# Patient Record
Sex: Male | Born: 1945
Health system: Southern US, Community
[De-identification: ages and names within clinical notes are randomized; demographics above are authoritative.]

## PROBLEM LIST (undated history)

## (undated) DIAGNOSIS — I213 ST elevation (STEMI) myocardial infarction of unspecified site: Secondary | ICD-10-CM

## (undated) DIAGNOSIS — I10 Essential (primary) hypertension: Secondary | ICD-10-CM

## (undated) DIAGNOSIS — K2270 Barrett's esophagus without dysplasia: Secondary | ICD-10-CM

## (undated) DIAGNOSIS — I2510 Atherosclerotic heart disease of native coronary artery without angina pectoris: Secondary | ICD-10-CM

## (undated) DIAGNOSIS — E119 Type 2 diabetes mellitus without complications: Secondary | ICD-10-CM

## (undated) DIAGNOSIS — E7800 Pure hypercholesterolemia, unspecified: Secondary | ICD-10-CM

## (undated) DIAGNOSIS — T8859XA Other complications of anesthesia, initial encounter: Secondary | ICD-10-CM

## (undated) DIAGNOSIS — T4145XA Adverse effect of unspecified anesthetic, initial encounter: Secondary | ICD-10-CM

## (undated) HISTORY — DX: Barrett's esophagus without dysplasia: K22.70

## (undated) HISTORY — DX: Pure hypercholesterolemia, unspecified: E78.00

## (undated) HISTORY — PX: OTHER SURGICAL HISTORY: SHX169

---

## 2006-07-29 DIAGNOSIS — K2270 Barrett's esophagus without dysplasia: Secondary | ICD-10-CM

## 2006-07-29 HISTORY — PX: ESOPHAGOGASTRODUODENOSCOPY: SHX1529

## 2006-07-29 HISTORY — PX: COLONOSCOPY: SHX174

## 2006-07-29 HISTORY — DX: Barrett's esophagus without dysplasia: K22.70

## 2015-01-09 ENCOUNTER — Emergency Department (HOSPITAL_COMMUNITY): Payer: Non-veteran care

## 2015-01-09 ENCOUNTER — Encounter (HOSPITAL_COMMUNITY): Payer: Self-pay | Admitting: *Deleted

## 2015-01-09 ENCOUNTER — Encounter (HOSPITAL_COMMUNITY)
Admission: EM | Disposition: A | Discharge status: Home / Self Care | Payer: Self-pay | Source: Home / Self Care | Attending: Cardiology

## 2015-01-09 ENCOUNTER — Other Ambulatory Visit (HOSPITAL_COMMUNITY): Payer: Self-pay

## 2015-01-09 ENCOUNTER — Inpatient Hospital Stay (HOSPITAL_COMMUNITY)
Admission: EM | Admit: 2015-01-09 | Discharge: 2015-01-11 | DRG: 247 | Disposition: A | Discharge status: Home / Self Care | Payer: Non-veteran care | Attending: Cardiology | Admitting: Cardiology

## 2015-01-09 DIAGNOSIS — E119 Type 2 diabetes mellitus without complications: Secondary | ICD-10-CM | POA: Diagnosis present

## 2015-01-09 DIAGNOSIS — K219 Gastro-esophageal reflux disease without esophagitis: Secondary | ICD-10-CM | POA: Diagnosis present

## 2015-01-09 DIAGNOSIS — I25110 Atherosclerotic heart disease of native coronary artery with unstable angina pectoris: Secondary | ICD-10-CM | POA: Diagnosis present

## 2015-01-09 DIAGNOSIS — Z87891 Personal history of nicotine dependence: Secondary | ICD-10-CM | POA: Diagnosis not present

## 2015-01-09 DIAGNOSIS — Z8249 Family history of ischemic heart disease and other diseases of the circulatory system: Secondary | ICD-10-CM | POA: Diagnosis not present

## 2015-01-09 DIAGNOSIS — I2102 ST elevation (STEMI) myocardial infarction involving left anterior descending coronary artery: Secondary | ICD-10-CM | POA: Diagnosis not present

## 2015-01-09 DIAGNOSIS — E785 Hyperlipidemia, unspecified: Secondary | ICD-10-CM | POA: Diagnosis present

## 2015-01-09 DIAGNOSIS — E876 Hypokalemia: Secondary | ICD-10-CM | POA: Diagnosis not present

## 2015-01-09 DIAGNOSIS — I9788 Other intraoperative complications of the circulatory system, not elsewhere classified: Secondary | ICD-10-CM | POA: Diagnosis not present

## 2015-01-09 DIAGNOSIS — D62 Acute posthemorrhagic anemia: Secondary | ICD-10-CM | POA: Diagnosis not present

## 2015-01-09 DIAGNOSIS — I2119 ST elevation (STEMI) myocardial infarction involving other coronary artery of inferior wall: Secondary | ICD-10-CM | POA: Diagnosis present

## 2015-01-09 DIAGNOSIS — I2510 Atherosclerotic heart disease of native coronary artery without angina pectoris: Secondary | ICD-10-CM

## 2015-01-09 DIAGNOSIS — IMO0001 Reserved for inherently not codable concepts without codable children: Secondary | ICD-10-CM

## 2015-01-09 DIAGNOSIS — Z743 Need for continuous supervision: Secondary | ICD-10-CM | POA: Diagnosis not present

## 2015-01-09 DIAGNOSIS — Z794 Long term (current) use of insulin: Secondary | ICD-10-CM

## 2015-01-09 DIAGNOSIS — I10 Essential (primary) hypertension: Secondary | ICD-10-CM | POA: Diagnosis present

## 2015-01-09 DIAGNOSIS — I213 ST elevation (STEMI) myocardial infarction of unspecified site: Secondary | ICD-10-CM | POA: Diagnosis present

## 2015-01-09 DIAGNOSIS — R079 Chest pain, unspecified: Secondary | ICD-10-CM | POA: Diagnosis not present

## 2015-01-09 HISTORY — DX: Atherosclerotic heart disease of native coronary artery without angina pectoris: I25.10

## 2015-01-09 HISTORY — DX: Type 2 diabetes mellitus without complications: E11.9

## 2015-01-09 HISTORY — DX: ST elevation (STEMI) myocardial infarction of unspecified site: I21.3

## 2015-01-09 HISTORY — DX: Essential (primary) hypertension: I10

## 2015-01-09 HISTORY — PX: CARDIAC CATHETERIZATION: SHX172

## 2015-01-09 LAB — TROPONIN I
Troponin I: <0.03 ng/mL (ref <0.031)
Troponin I: <0.03 ng/mL (ref <0.031)
Troponin I: 0.24 ng/mL — ABNORMAL HIGH (ref <0.031)
Troponin I: 0.64 ng/mL (ref <0.031)

## 2015-01-09 LAB — GLUCOSE, CAPILLARY
Glucose-Capillary: 218 mg/dL — ABNORMAL HIGH (ref 65–99)
Glucose-Capillary: 278 mg/dL — ABNORMAL HIGH (ref 65–99)

## 2015-01-09 LAB — COMPREHENSIVE METABOLIC PANEL
ALBUMIN: 4.6 g/dL (ref 3.5–5.0)
ALT: 47 U/L (ref 17–63)
ANION GAP: 11 (ref 5–15)
AST: 32 U/L (ref 15–41)
Alkaline Phosphatase: 79 U/L (ref 38–126)
BUN: 18 mg/dL (ref 6–20)
CALCIUM: 9.5 mg/dL (ref 8.9–10.3)
CHLORIDE: 98 mmol/L — AB (ref 101–111)
CO2: 28 mmol/L (ref 22–32)
Creatinine, Ser: 0.87 mg/dL (ref 0.61–1.24)
GFR calc Af Amer: >60 mL/min (ref >60)
Glucose, Bld: 253 mg/dL — ABNORMAL HIGH (ref 65–99)
Potassium: 4 mmol/L (ref 3.5–5.1)
Sodium: 137 mmol/L (ref 135–145)
Total Bilirubin: 1.2 mg/dL (ref 0.3–1.2)
Total Protein: 7.8 g/dL (ref 6.5–8.1)

## 2015-01-09 LAB — CBC
HCT: 35.4 % — ABNORMAL LOW (ref 39.0–52.0)
Hemoglobin: 12.8 g/dL — ABNORMAL LOW (ref 13.0–17.0)
MCH: 29.4 pg (ref 26.0–34.0)
MCHC: 36.2 g/dL — ABNORMAL HIGH (ref 30.0–36.0)
MCV: 81.4 fL (ref 78.0–100.0)
PLATELETS: 215 10*3/uL (ref 150–400)
RBC: 4.35 MIL/uL (ref 4.22–5.81)
RDW: 12.5 % (ref 11.5–15.5)
WBC: 8.7 10*3/uL (ref 4.0–10.5)

## 2015-01-09 LAB — TSH: TSH: 0.715 u[IU]/mL (ref 0.350–4.500)

## 2015-01-09 LAB — CBC WITH DIFFERENTIAL/PLATELET
BASOS PCT: 0 % (ref 0–1)
Basophils Absolute: 0 10*3/uL (ref 0.0–0.1)
EOS ABS: 0.1 10*3/uL (ref 0.0–0.7)
Eosinophils Relative: 1 % (ref 0–5)
HCT: 42.8 % (ref 39.0–52.0)
HEMOGLOBIN: 15.1 g/dL (ref 13.0–17.0)
Lymphocytes Relative: 33 % (ref 12–46)
Lymphs Abs: 2 10*3/uL (ref 0.7–4.0)
MCH: 29.4 pg (ref 26.0–34.0)
MCHC: 35.3 g/dL (ref 30.0–36.0)
MCV: 83.4 fL (ref 78.0–100.0)
Monocytes Absolute: 0.4 10*3/uL (ref 0.1–1.0)
Monocytes Relative: 7 % (ref 3–12)
Neutro Abs: 3.6 10*3/uL (ref 1.7–7.7)
Neutrophils Relative %: 59 % (ref 43–77)
Platelets: 213 10*3/uL (ref 150–400)
RBC: 5.13 MIL/uL (ref 4.22–5.81)
RDW: 12.6 % (ref 11.5–15.5)
WBC: 6.1 10*3/uL (ref 4.0–10.5)

## 2015-01-09 LAB — MRSA PCR SCREENING: MRSA by PCR: NEGATIVE

## 2015-01-09 LAB — PROTIME-INR
INR: 1 (ref 0.00–1.49)
PROTHROMBIN TIME: 13.4 s (ref 11.6–15.2)

## 2015-01-09 LAB — POCT ACTIVATED CLOTTING TIME: ACTIVATED CLOTTING TIME: 491 s

## 2015-01-09 SURGERY — LEFT HEART CATH AND CORONARY ANGIOGRAPHY

## 2015-01-09 MED ORDER — HEPARIN (PORCINE) IN NACL 2-0.9 UNIT/ML-% IJ SOLN
INTRAMUSCULAR | Status: AC
  Filled 2015-01-09: qty 1000

## 2015-01-09 MED ORDER — ENOXAPARIN SODIUM 40 MG/0.4ML ~~LOC~~ SOLN
40.0000 mg | SUBCUTANEOUS | Status: DC
  Administered 2015-01-10 – 2015-01-11 (×2): 40 mg via SUBCUTANEOUS
  Filled 2015-01-09 (×3): qty 0.4

## 2015-01-09 MED ORDER — SODIUM CHLORIDE 0.9 % IJ SOLN: 3.0000 mL | INTRAMUSCULAR | Status: DC | PRN

## 2015-01-09 MED ORDER — SODIUM CHLORIDE 0.9 % IV SOLN: 250.0000 mL | INTRAVENOUS | Status: DC | PRN

## 2015-01-09 MED ORDER — MIDAZOLAM HCL 2 MG/2ML IJ SOLN
INTRAMUSCULAR | Status: AC
  Filled 2015-01-09: qty 2

## 2015-01-09 MED ORDER — ASPIRIN 325 MG PO TABS
325.0000 mg | ORAL_TABLET | Freq: Once | ORAL | Status: AC
  Administered 2015-01-09: 325 mg via ORAL
  Filled 2015-01-09: qty 1

## 2015-01-09 MED ORDER — NITROGLYCERIN 1 MG/10 ML FOR IR/CATH LAB
INTRA_ARTERIAL | Status: AC
  Filled 2015-01-09: qty 10

## 2015-01-09 MED ORDER — BIVALIRUDIN 250 MG IV SOLR
INTRAVENOUS | Status: AC
  Filled 2015-01-09: qty 250

## 2015-01-09 MED ORDER — TICAGRELOR 90 MG PO TABS
ORAL_TABLET | ORAL | Status: AC
  Filled 2015-01-09: qty 1

## 2015-01-09 MED ORDER — METOPROLOL TARTRATE 1 MG/ML IV SOLN
INTRAVENOUS | Status: DC | PRN
  Administered 2015-01-09: 5 mg via INTRAVENOUS

## 2015-01-09 MED ORDER — ONDANSETRON HCL 4 MG/2ML IJ SOLN
4.0000 mg | Freq: Four times a day (QID) | INTRAMUSCULAR | Status: DC | PRN
  Administered 2015-01-09: 4 mg via INTRAVENOUS
  Filled 2015-01-09: qty 2

## 2015-01-09 MED ORDER — IOHEXOL 350 MG/ML SOLN
INTRAVENOUS | Status: DC | PRN
  Administered 2015-01-09: 245 mL via INTRAVENOUS

## 2015-01-09 MED ORDER — AMLODIPINE BESYLATE 5 MG PO TABS
5.0000 mg | ORAL_TABLET | Freq: Every day | ORAL | Status: DC
  Administered 2015-01-09 – 2015-01-11 (×3): 5 mg via ORAL
  Filled 2015-01-09 (×3): qty 1

## 2015-01-09 MED ORDER — ASPIRIN 81 MG PO CHEW
81.0000 mg | CHEWABLE_TABLET | Freq: Every day | ORAL | Status: DC
  Administered 2015-01-10 – 2015-01-11 (×2): 81 mg via ORAL
  Filled 2015-01-09 (×2): qty 1

## 2015-01-09 MED ORDER — ALPRAZOLAM 0.25 MG PO TABS: 1.0000 mg | ORAL_TABLET | Freq: Two times a day (BID) | ORAL | Status: DC | PRN

## 2015-01-09 MED ORDER — HEPARIN (PORCINE) IN NACL 100-0.45 UNIT/ML-% IJ SOLN
1000.0000 [IU]/h | INTRAMUSCULAR | Status: DC
  Administered 2015-01-09: 1000 [IU]/h via INTRAVENOUS
  Filled 2015-01-09: qty 250

## 2015-01-09 MED ORDER — SODIUM CHLORIDE 0.9 % IV SOLN
250.0000 mg | INTRAVENOUS | Status: DC | PRN
  Administered 2015-01-09 (×2): 1.75 mg/kg/h via INTRAVENOUS

## 2015-01-09 MED ORDER — INSULIN ASPART 100 UNIT/ML ~~LOC~~ SOLN: 0.0000 [IU] | Freq: Three times a day (TID) | SUBCUTANEOUS | Status: DC

## 2015-01-09 MED ORDER — BIVALIRUDIN BOLUS VIA INFUSION - CUPID
INTRAVENOUS | Status: DC | PRN
  Administered 2015-01-09: 62.925 mg via INTRAVENOUS

## 2015-01-09 MED ORDER — PNEUMOCOCCAL VAC POLYVALENT 25 MCG/0.5ML IJ INJ
0.5000 mL | INJECTION | INTRAMUSCULAR | Status: DC
  Filled 2015-01-09: qty 0.5

## 2015-01-09 MED ORDER — TIROFIBAN HCL IV 5 MG/100ML
0.1500 ug/kg/min | INTRAVENOUS | Status: AC
  Administered 2015-01-09 – 2015-01-10 (×3): 0.15 ug/kg/min via INTRAVENOUS
  Filled 2015-01-09 (×2): qty 100

## 2015-01-09 MED ORDER — HYDROCHLOROTHIAZIDE 12.5 MG PO CAPS
12.5000 mg | ORAL_CAPSULE | Freq: Every day | ORAL | Status: DC
  Administered 2015-01-10 – 2015-01-11 (×2): 12.5 mg via ORAL
  Filled 2015-01-09 (×2): qty 1

## 2015-01-09 MED ORDER — ACETAMINOPHEN 325 MG PO TABS: 650.0000 mg | ORAL_TABLET | ORAL | Status: DC | PRN

## 2015-01-09 MED ORDER — ZOLPIDEM TARTRATE 5 MG PO TABS: 5.0000 mg | ORAL_TABLET | Freq: Every evening | ORAL | Status: DC | PRN

## 2015-01-09 MED ORDER — ALPRAZOLAM 0.25 MG PO TABS: 0.2500 mg | ORAL_TABLET | Freq: Two times a day (BID) | ORAL | Status: DC | PRN

## 2015-01-09 MED ORDER — NITROGLYCERIN 0.4 MG SL SUBL: 0.4000 mg | SUBLINGUAL_TABLET | SUBLINGUAL | Status: DC | PRN

## 2015-01-09 MED ORDER — NITROGLYCERIN IN D5W 200-5 MCG/ML-% IV SOLN
0.0000 ug/min | INTRAVENOUS | Status: DC
Start: 2015-01-09 — End: 2015-01-11
  Administered 2015-01-09: 30 ug/min via INTRAVENOUS
  Administered 2015-01-10: 70 ug/min via INTRAVENOUS
  Filled 2015-01-09: qty 250

## 2015-01-09 MED ORDER — LISINOPRIL 20 MG PO TABS
20.0000 mg | ORAL_TABLET | Freq: Every day | ORAL | Status: DC
  Administered 2015-01-10 – 2015-01-11 (×2): 20 mg via ORAL
  Filled 2015-01-09 (×2): qty 1

## 2015-01-09 MED ORDER — NITROGLYCERIN IN D5W 200-5 MCG/ML-% IV SOLN
INTRAVENOUS | Status: DC | PRN
  Administered 2015-01-09: 10 ug/min via INTRAVENOUS

## 2015-01-09 MED ORDER — SODIUM CHLORIDE 0.9 % IJ SOLN
3.0000 mL | Freq: Two times a day (BID) | INTRAMUSCULAR | Status: DC
Start: 2015-01-09 — End: 2015-01-11
  Administered 2015-01-09 – 2015-01-11 (×4): 3 mL via INTRAVENOUS

## 2015-01-09 MED ORDER — HEPARIN SODIUM (PORCINE) 5000 UNIT/ML IJ SOLN
4000.0000 [IU] | Freq: Once | INTRAMUSCULAR | Status: AC
  Administered 2015-01-09: 4000 [IU] via INTRAVENOUS
  Filled 2015-01-09: qty 1

## 2015-01-09 MED ORDER — LISINOPRIL-HYDROCHLOROTHIAZIDE 20-12.5 MG PO TABS: 1.0000 | ORAL_TABLET | Freq: Every day | ORAL | Status: DC

## 2015-01-09 MED ORDER — PANTOPRAZOLE SODIUM 40 MG PO TBEC
40.0000 mg | DELAYED_RELEASE_TABLET | Freq: Every day | ORAL | Status: DC
  Administered 2015-01-10 – 2015-01-11 (×2): 40 mg via ORAL
  Filled 2015-01-09 (×2): qty 1

## 2015-01-09 MED ORDER — MORPHINE SULFATE 4 MG/ML IJ SOLN
INTRAMUSCULAR | Status: AC
  Filled 2015-01-09: qty 1

## 2015-01-09 MED ORDER — MIDAZOLAM HCL 2 MG/2ML IJ SOLN
INTRAMUSCULAR | Status: DC | PRN
  Administered 2015-01-09 (×3): 2 mg via INTRAVENOUS

## 2015-01-09 MED ORDER — SODIUM CHLORIDE 0.9 % WEIGHT BASED INFUSION
3.0000 mL/kg/h | INTRAVENOUS | Status: AC
  Administered 2015-01-09: 3 mL/kg/h via INTRAVENOUS

## 2015-01-09 MED ORDER — MORPHINE SULFATE 4 MG/ML IJ SOLN
4.0000 mg | Freq: Once | INTRAMUSCULAR | Status: AC
  Administered 2015-01-09: 4 mg via INTRAVENOUS

## 2015-01-09 MED ORDER — NITROGLYCERIN 1 MG/10 ML FOR IR/CATH LAB
INTRA_ARTERIAL | Status: DC | PRN
  Administered 2015-01-09: 200 ug

## 2015-01-09 MED ORDER — FENTANYL CITRATE (PF) 100 MCG/2ML IJ SOLN
INTRAMUSCULAR | Status: DC | PRN
  Administered 2015-01-09 (×2): 50 ug via INTRAVENOUS
  Administered 2015-01-09: 25 ug via INTRAVENOUS
  Administered 2015-01-09: 50 ug via INTRAVENOUS

## 2015-01-09 MED ORDER — ACETAMINOPHEN 325 MG PO TABS
650.0000 mg | ORAL_TABLET | ORAL | Status: DC | PRN
  Administered 2015-01-10 (×3): 650 mg via ORAL
  Filled 2015-01-09 (×3): qty 2

## 2015-01-09 MED ORDER — METOPROLOL TARTRATE 1 MG/ML IV SOLN
INTRAVENOUS | Status: AC
  Filled 2015-01-09: qty 5

## 2015-01-09 MED ORDER — ATORVASTATIN CALCIUM 80 MG PO TABS: 80.0000 mg | ORAL_TABLET | Freq: Every day | ORAL | Status: DC

## 2015-01-09 MED ORDER — MORPHINE SULFATE 4 MG/ML IJ SOLN
4.0000 mg | Freq: Once | INTRAMUSCULAR | Status: AC
  Administered 2015-01-09: 4 mg via INTRAVENOUS
  Filled 2015-01-09: qty 1

## 2015-01-09 MED ORDER — ATORVASTATIN CALCIUM 80 MG PO TABS
80.0000 mg | ORAL_TABLET | Freq: Every day | ORAL | Status: DC
  Administered 2015-01-09 – 2015-01-10 (×2): 80 mg via ORAL
  Filled 2015-01-09 (×3): qty 1

## 2015-01-09 MED ORDER — MIDAZOLAM HCL 2 MG/2ML IJ SOLN
INTRAMUSCULAR | Status: AC
Start: 2015-01-09 — End: 2015-01-09
  Filled 2015-01-09: qty 2

## 2015-01-09 MED ORDER — LIDOCAINE HCL (PF) 1 % IJ SOLN
INTRAMUSCULAR | Status: AC
  Filled 2015-01-09: qty 30

## 2015-01-09 MED ORDER — ALUM & MAG HYDROXIDE-SIMETH 200-200-20 MG/5ML PO SUSP
30.0000 mL | Freq: Four times a day (QID) | ORAL | Status: DC | PRN
  Administered 2015-01-09: 30 mL via ORAL
  Filled 2015-01-09: qty 30

## 2015-01-09 MED ORDER — NITROGLYCERIN IN D5W 200-5 MCG/ML-% IV SOLN
INTRAVENOUS | Status: AC
  Filled 2015-01-09: qty 250

## 2015-01-09 MED ORDER — ATENOLOL 50 MG PO TABS
50.0000 mg | ORAL_TABLET | Freq: Every day | ORAL | Status: DC
  Filled 2015-01-09: qty 1

## 2015-01-09 MED ORDER — TIROFIBAN HCL IV 5 MG/100ML
INTRAVENOUS | Status: DC | PRN
  Administered 2015-01-09: 0.15 ug/kg/min via INTRAVENOUS
  Administered 2015-01-09: 25200 ug/h via INTRAVENOUS

## 2015-01-09 MED ORDER — SODIUM CHLORIDE 0.9 % IV SOLN
1.7500 mg/kg/h | INTRAVENOUS | Status: DC
  Filled 2015-01-09: qty 250

## 2015-01-09 MED ORDER — ONDANSETRON HCL 4 MG/2ML IJ SOLN: 4.0000 mg | Freq: Four times a day (QID) | INTRAMUSCULAR | Status: DC | PRN

## 2015-01-09 MED ORDER — FENTANYL CITRATE (PF) 100 MCG/2ML IJ SOLN
INTRAMUSCULAR | Status: AC
  Filled 2015-01-09: qty 2

## 2015-01-09 MED ORDER — MORPHINE SULFATE 2 MG/ML IJ SOLN
2.0000 mg | INTRAMUSCULAR | Status: DC | PRN
  Administered 2015-01-09 – 2015-01-10 (×4): 2 mg via INTRAVENOUS
  Filled 2015-01-09 (×4): qty 1

## 2015-01-09 MED ORDER — INSULIN ASPART 100 UNIT/ML ~~LOC~~ SOLN
0.0000 [IU] | Freq: Every day | SUBCUTANEOUS | Status: DC
  Administered 2015-01-09 – 2015-01-10 (×2): 3 [IU] via SUBCUTANEOUS

## 2015-01-09 MED ORDER — GLIPIZIDE 10 MG PO TABS
20.0000 mg | ORAL_TABLET | Freq: Two times a day (BID) | ORAL | Status: DC
  Administered 2015-01-10 – 2015-01-11 (×3): 20 mg via ORAL
  Filled 2015-01-09 (×5): qty 2

## 2015-01-09 MED ORDER — SODIUM CHLORIDE 0.9 % IJ SOLN: 3.0000 mL | Freq: Two times a day (BID) | INTRAMUSCULAR | Status: DC

## 2015-01-09 MED ORDER — INSULIN ASPART 100 UNIT/ML ~~LOC~~ SOLN
0.0000 [IU] | Freq: Three times a day (TID) | SUBCUTANEOUS | Status: DC
  Administered 2015-01-10 (×2): 5 [IU] via SUBCUTANEOUS
  Administered 2015-01-10: 3 [IU] via SUBCUTANEOUS
  Administered 2015-01-11 (×2): 5 [IU] via SUBCUTANEOUS

## 2015-01-09 MED ORDER — TICAGRELOR 90 MG PO TABS
90.0000 mg | ORAL_TABLET | Freq: Two times a day (BID) | ORAL | Status: DC
  Administered 2015-01-09 – 2015-01-11 (×4): 90 mg via ORAL
  Filled 2015-01-09 (×5): qty 1

## 2015-01-09 MED ORDER — VITAMIN D3 25 MCG (1000 UNIT) PO TABS
1000.0000 [IU] | ORAL_TABLET | Freq: Every day | ORAL | Status: DC
Start: 2015-01-10 — End: 2015-01-11
  Administered 2015-01-10 – 2015-01-11 (×2): 1000 [IU] via ORAL
  Filled 2015-01-09 (×2): qty 1

## 2015-01-09 MED ORDER — TIROFIBAN HCL IV 5 MG/100ML
INTRAVENOUS | Status: AC
  Filled 2015-01-09: qty 100

## 2015-01-09 SURGICAL SUPPLY — 31 items
BALLN ANGIOSCULPT RX 2.0X10 (BALLOONS) ×4
BALLN EMERGE MR 2.0X8 (BALLOONS) ×4
BALLN MINITREK RX 1.5X8 (BALLOONS) ×2 IMPLANT
BALLN TREK RX 2.5X12 (BALLOONS) ×4
BALLN ~~LOC~~ EUPHORA RX 2.75X12 (BALLOONS) ×4
BALLN ~~LOC~~ TREK RX 2.25X8 (BALLOONS) ×4
BALLN ~~LOC~~ TREK RX 3.0X8 (BALLOONS) ×4
BALLN ~~LOC~~ TREK RX 3.25X8 (BALLOONS) ×2 IMPLANT
BALLOON ANGIOSCULPT RX 2.0X10 (BALLOONS) IMPLANT
BALLOON EMERGE MR 2.0X8 (BALLOONS) IMPLANT
BALLOON TREK RX 2.5X12 (BALLOONS) IMPLANT
BALLOON ~~LOC~~ EUPHORA RX 2.75X12 (BALLOONS) IMPLANT
BALLOON ~~LOC~~ TREK RX 2.25X8 (BALLOONS) IMPLANT
BALLOON ~~LOC~~ TREK RX 3.0X8 (BALLOONS) IMPLANT
CATH INFINITI 5FR ANG PIGTAIL (CATHETERS) ×4 IMPLANT
CATH OPTITORQUE TIG 4.0 5F (CATHETERS) ×4 IMPLANT
CATH VISTA GUIDE 6FR XBLAD3.5 (CATHETERS) ×2 IMPLANT
DEVICE RAD COMP TR BAND LRG (VASCULAR PRODUCTS) ×4 IMPLANT
GLIDESHEATH SLEND A-KIT 6F 22G (SHEATH) ×4 IMPLANT
KIT ENCORE 26 ADVANTAGE (KITS) ×4 IMPLANT
KIT HEART LEFT (KITS) ×4 IMPLANT
PACK CARDIAC CATHETERIZATION (CUSTOM PROCEDURE TRAY) ×4 IMPLANT
STENT XIENCE ALPINE RX 2.25X12 (Permanent Stent) ×2 IMPLANT
STENT XIENCE ALPINE RX 2.5X18 (Permanent Stent) ×2 IMPLANT
STENT XIENCE ALPINE RX 2.5X8 (Permanent Stent) ×2 IMPLANT
SYR MEDRAD MARK V 150ML (SYRINGE) ×4 IMPLANT
TRANSDUCER W/STOPCOCK (MISCELLANEOUS) ×4 IMPLANT
TUBING CIL FLEX 10 FLL-RA (TUBING) ×4 IMPLANT
WIRE ASAHI PROWATER 180CM (WIRE) ×2 IMPLANT
WIRE HI TORQ BMW 190CM (WIRE) ×2 IMPLANT
WIRE SAFE-T 1.5MM-J .035X260CM (WIRE) ×6 IMPLANT

## 2015-01-09 NOTE — ED Provider Notes (Signed)
CSN: [id number]     Arrival date & time 01/09/15  1148 History   This chart was scribed for Azalia Bilis, MD by Tanda Rockers, ED Scribe. This patient was seen in room APA18/[facility]18 and the patient's care was started at 12:06 PM.  Chief Complaint  Patient presents with  . Chest Pain   The history is provided by the patient and a relative. No language interpreter was used.     HPI Comments: Gary Wheeler is a 68 y.o. male with hx HTN and DM who presents to the Emergency Department complaining of sudden onset, constant, left chest pain that occurred this morning around 10 AM ( 2 hours ago) Pt was working in his garden and doing heavy lifting when the pain came on. He describes it as a heavy cramping sensation. Pt also complains of shortness of breath. He mentions that he felt gassy today and has been burping a lot. Pt states that last week he had a dry cough, nasal congestion, fever, and generalized body aches that has since resolved. Family reports that a couple of weeks ago he was having mild chest discomfort and feeling short of breath. Pt states it felt like he pulled a muscle but that the pain feels more severe today. Pt has never had a stress test or cardiac cath in the past. He does have positive fhx cardiac issues with father and grandfather.   Past Medical History  Diagnosis Date  . Hypertension   . Diabetes mellitus without complication    History reviewed. No pertinent past surgical history. History reviewed. No pertinent family history. History  Substance Use Topics  . Smoking status: Former Games developer  . Smokeless tobacco: Not on file  . Alcohol Use: Yes     Comment: rare    Review of Systems  A complete 10 system review of systems was obtained and all systems are negative except as noted in the HPI and PMH.    Allergies  Review of patient's allergies indicates no known allergies.  Home Medications  Unknown   Triage Vitals: BP 176/118 mmHg  Pulse 62  Temp(Src) 97.7 F  (36.5 C) (Oral)  Resp 15  Ht  (1.803 m)  Wt 185 lb (83.915 kg)  BMI 25.81 kg/m2  SpO2 96%   Physical Exam  Constitutional: He is oriented to person, place, and time. He appears well-developed and well-nourished.  HENT:  Head: Normocephalic and atraumatic.  Eyes: EOM are normal.  Neck: Normal range of motion.  Cardiovascular: Normal rate, regular rhythm, normal heart sounds and intact distal pulses.   Pulmonary/Chest: Effort normal and breath sounds normal. No respiratory distress.  Abdominal: Soft. He exhibits no distension. There is no tenderness.  Musculoskeletal: Normal range of motion.  Neurological: He is alert and oriented to person, place, and time.  Skin: Skin is warm and dry.  Psychiatric: He has a normal mood and affect. Judgment normal.  Nursing note and vitals reviewed.   ED Course  Procedures (including critical care time)  CRITICAL CARE Performed by: Lyanne Co Total critical care time: 35 Critical care time was exclusive of separately billable procedures and treating other patients. Critical care was necessary to treat or prevent imminent or life-threatening deterioration. Critical care was time spent personally by me on the following activities: development of treatment plan with patient and/or surrogate as well as nursing, discussions with consultants, evaluation of patient's response to treatment, examination of patient, obtaining history from patient or surrogate, ordering and performing treatments and interventions,  ordering and review of laboratory studies, ordering and review of radiographic studies, pulse oximetry and re-evaluation of patient's condition.   DIAGNOSTIC STUDIES: Oxygen Saturation is 96% on RA, normal by my interpretation.    COORDINATION OF CARE: 12:13 PM-Discussed treatment plan which includes CXR, CBC, CMP, Troponin with pt at bedside and pt agreed to plan.   Labs Review Labs Reviewed  CBC WITH DIFFERENTIAL/PLATELET   COMPREHENSIVE METABOLIC PANEL  TROPONIN I    Imaging Review No results found.   EKG Interpretation   Date/Time:  Monday January 09 2015 11:56:18 EDT Ventricular Rate:  60 PR Interval:  155 QRS Duration: 78 QT Interval:  417 QTC Calculation: 417 R Axis:   48 Text Interpretation:  Sinus rhythm Low voltage, precordial leads  nonspecific diffuse ST changes without reciprocal change No old tracing to  compare Confirmed by Natsuko Kelsay  MD, Caryn Bee ([id number]) on 01/09/2015 12:13:56 PM      MDM   Final diagnoses:  ST elevation myocardial infarction (STEMI), unspecified artery    1220pm- repeat ecg with concerning changes in II and III as compared to earlier ecg timed 1156am. With story concerning for ACS and now with dynamic ecg changes, code STEMI called. Initial ecg was nondiagnostic for STEMI. Spoke with Dr [place], interventional cardiology at [facility] who accepts the pt in transfer. 2nd IV now. ASA now. Heparin now. Morphine for pain. Emergent transfer  I personally performed the services described in this documentation, which was scribed in my presence. The recorded information has been reviewed and is accurate.        Azalia Bilis, MD 01/09/15 1235

## 2015-01-09 NOTE — Progress Notes (Signed)
eLink Physician-Brief Progress Note Patient Name: Gary Wheeler DOB: March 16, 1946 MRN: [id number]   Date of Service  01/09/2015  HPI/Events of Note  69 yo male with PMH of HTN and DM. Presents with chest pain and Acute Inferior STEMI. Now s/p cath lab and PCI/Stent. Current medical regimen includes Tenormin, Lipitor, Lisinopril, Hydrochlorothiazide, NTG IV infusion and Brilinta. Management per Cardiology. BP = 128/90 and HR = 52. O2 sat = 95% and RR = 16.  eICU Interventions  Continue present management.      Intervention Category Evaluation Type: New Patient Evaluation  Lenell Antu 01/09/2015, 5:00 PM

## 2015-01-09 NOTE — Progress Notes (Signed)
ANTICOAGULATION CONSULT NOTE - Initial Consult  Pharmacy Consult for heparin Indication: chest pain/ACS  No Known Allergies  Patient Measurements: Height: 5\' 11"  (180.3 cm) Weight: 185 lb (83.915 kg) IBW/kg (Calculated) : 75.3 HEPARIN DW (KG): 83.9  Vital Signs: Temp: 97.7 F (36.5 C) (06/13 1156) Temp Source: Oral (06/13 1156) BP: 176/118 mmHg (06/13 1200) Pulse Rate: 62 (06/13 1200)  Labs: No results for input(s): HGB, HCT, PLT, APTT, LABPROT, INR, HEPARINUNFRC, CREATININE, CKTOTAL, CKMB, TROPONINI in the last 72 hours.  CrCl cannot be calculated (Patient has no serum creatinine result on file.).   Medical History: Past Medical History  Diagnosis Date  . Hypertension   . Diabetes mellitus without complication     Medications:  Scheduled:  . heparin  4,000 Units Intravenous Once    Assessment: 69 yo M admitted with chest pain. ST elevated on EKG. Baseline labs pending.  Goal of Therapy:  Heparin level 0.3-0.7  Monitor platelets by anticoagulation protocol: Yes   Plan:  Give 4000 units bolus x 1 Start heparin infusion at 1000 units/hr Check anti-Xa level in 6 hours and daily while on heparin Continue to monitor H&H and platelets  Frederica Chrestman, [facility] 01/09/2015,12:25 PM

## 2015-01-09 NOTE — H&P (Addendum)
History and Physical Note:  NAME:  Gary Wheeler   MRN: [id number] DOB:  Nov 30, 1945   ADMIT DATE: 01/09/2015 [facility]   01/09/2015 1:58 PM  Gary Wheeler is a 69 y.o. male with hx HTN and DM who presented to the [facility] complaining of sudden onset, constant, left chest pain that occurred this morning around 10 AM.   Pt was working in his garden and doing heavy lifting when the pain came on. He describes it as a heavy cramping sensation. Pt also complains of shortness of breath. He mentions that he felt gassy today and has been burping a lot.   Pt states that last week he had a dry cough, nasal congestion, fever, and generalized body aches, but that has since resolved.   Family reports that he has been having milder chest discomfort and shortness of breath with exertion, relieved by rest. Pt states it felt like he pulled a muscle but that the pain feels more severe today. Pt has never had a stress test or cardiac cath in the past. He does have positive fhx cardiac issues with father and grandfather. Pain was 6/10 at worse. He had no N&V or diaphoresis.  Initial EKGs were nondiagnostic, but a follow-up EKG at 12:18 pm revealed inferior ST elevations. Code STEMI was called the patient was transferred for emergent cardiac catheterization.  He has received IV heparin as well as aspirin and morphine  @ [facility] ER.  On arrival to [facility] (1320) --> Cath Lab (13:20), the patient with 2/10 SSCP.  BP/HR are stable.   Past Medical History  Diagnosis Date  . Hypertension   . Diabetes mellitus without complication    Past Surgical History  Procedure Laterality Date  . Esophagogastroduodenoscopy    . Colonoscopy      FAMHx:  Family History  Problem Relation Age of Onset  . Coronary artery disease Father   . Coronary artery disease Paternal Grandfather    Family Status  Relation Status Death Age  . Mother Deceased 44    Hx trach  . Father Deceased 30   SOCHx:  History    Social History  . Marital Status: Married    Spouse Name: N/A  . Number of Children: N/A  . Years of Education: N/A   Occupational History  . Retired, but works 2 days/week gas station    Social History Main Topics  . Smoking status: Former Smoker    Quit date: 07/30/1999  . Smokeless tobacco: Never Used  . Alcohol Use: 0.0 oz/week    0 Standard drinks or equivalent per week     Comment: rare  . Drug Use: No  . Sexual Activity: Not on file   Other Topics Concern  . Not on file   Social History Narrative   Lives in [place], [place] with wife.    ALLERGIES: No Known Allergies  HOME MEDICATIONS: Unknown; He reports taking Prilosec & ?? BP medications. Family to bring in.  ROS: Takes Prilosec for GERD sx but no hx melena or bloody stools. No recent fevers or chills.  PHYSICAL EXAM:Blood pressure 168/99, pulse 61, temperature 97.7 F (36.5 C), temperature source Oral, resp. rate 21, height 5\' 11"  (1.803 m), weight 185 lb (83.915 kg), SpO2 99 %. General appearance: alert, cooperative, appears stated age, moderately obese and Otherwise well-nourished and well-developed HEENT: [place]/AT, EOMI, MMM, anicteric sclera Neck: no adenopathy, no carotid bruit, no JVD, supple, symmetrical, trachea midline and thyroid not enlarged, symmetric, no  tenderness/mass/nodules Lungs: clear to auscultation bilaterally, normal percussion bilaterally and Nonlabored, good air movement Heart: regular rate and rhythm, S1, S2 normal, no murmur, click, rub or gallop and normal apical impulse Abdomen: soft, non-tender; bowel sounds normal; no masses,  no organomegaly Extremities: extremities normal, atraumatic, no cyanosis or edema Pulses: 2+ and symmetric all 4 extrem Skin: Skin color, texture, turgor normal. No rashes or lesions Neurologic: Mental status: Alert, oriented, thought content appropriate Cranial nerves: normal   Adult ECG Report  Rate: 55 ;  Rhythm: sinus bradycardia;  QRS Axis: 55 ;  PR  Interval: 151 ;  QRS Duration: 98 ; QTc: 396;  Voltages:  borderline low voltage in precordial leads  Conduction Disturbances: none  Other Abnormalities: ~1-2 mm STE in II, III, aVF (also V4-6)   Narrative Interpretation: Inferior STE - consider Injury patterm  IMPRESSION & PLAN The patients' history has been reviewed, patient examined, no change in status from most recent note, stable for surgery. I have reviewed the patients' chart and labs. Questions were answered to the patient's satisfaction.    Gary Wheeler has presented today for surgery, with the diagnosis of chest pain The various methods of treatment have been discussed with the patient and family.   Risks / Complications include, but not limited to: Death, MI, CVA/TIA, VF/VT (with defibrillation), Bradycardia (need for temporary pacer placement), contrast induced nephropathy, bleeding / bruising / hematoma / pseudoaneurysm, vascular or coronary injury (with possible emergent CT or Vascular Surgery), adverse medication reactions, infection.     After consideration of risks, benefits and other options for treatment, the patient has consented to Procedure(s):  LEFT HEART CATHETERIZATION AND CORONARY ANGIOGRAPHY +/- AD HOC PERCUTANEOUS CORONARY INTERVENTION as a surgical intervention. Emergency Verbal Consent inferred.  We will proceed with the planned procedure.  He will be screened for CRFs and their control.  Signed Theodore Demark, PA-C   ATTENDING ATTESTATION:  Patient seen with Theodore Demark, PA-C.  I agree with her findings examination and recommendations. We discussed patient together. Plan is for emergent cardiac catheterization.   [place], [facility] [address] [place], [place]  [address]  [phone]  01/09/2015 1:58 PM

## 2015-01-09 NOTE — ED Notes (Signed)
Chest pain, onset this am.

## 2015-01-10 ENCOUNTER — Encounter (HOSPITAL_COMMUNITY): Payer: Self-pay | Admitting: Cardiology

## 2015-01-10 ENCOUNTER — Inpatient Hospital Stay (HOSPITAL_COMMUNITY): Payer: Non-veteran care

## 2015-01-10 DIAGNOSIS — I2510 Atherosclerotic heart disease of native coronary artery without angina pectoris: Secondary | ICD-10-CM

## 2015-01-10 LAB — LIPID PANEL
Cholesterol: 128 mg/dL (ref 0–200)
HDL: 28 mg/dL — ABNORMAL LOW (ref >40)
LDL Cholesterol: 60 mg/dL (ref 0–99)
Total CHOL/HDL Ratio: 4.6 RATIO
Triglycerides: 202 mg/dL — ABNORMAL HIGH (ref <150)
VLDL: 40 mg/dL (ref 0–40)

## 2015-01-10 LAB — CBC
HEMATOCRIT: 34.8 % — AB (ref 39.0–52.0)
HEMOGLOBIN: 12.2 g/dL — AB (ref 13.0–17.0)
MCH: 28.8 pg (ref 26.0–34.0)
MCHC: 35.1 g/dL (ref 30.0–36.0)
MCV: 82.1 fL (ref 78.0–100.0)
Platelets: 236 10*3/uL (ref 150–400)
RBC: 4.24 MIL/uL (ref 4.22–5.81)
RDW: 12.7 % (ref 11.5–15.5)
WBC: 8.1 10*3/uL (ref 4.0–10.5)

## 2015-01-10 LAB — BASIC METABOLIC PANEL
Anion gap: 10 (ref 5–15)
BUN: 16 mg/dL (ref 6–20)
CHLORIDE: 98 mmol/L — AB (ref 101–111)
CO2: 27 mmol/L (ref 22–32)
Calcium: 8.3 mg/dL — ABNORMAL LOW (ref 8.9–10.3)
Creatinine, Ser: 0.71 mg/dL (ref 0.61–1.24)
GFR calc non Af Amer: >60 mL/min (ref >60)
Glucose, Bld: 238 mg/dL — ABNORMAL HIGH (ref 65–99)
POTASSIUM: 3.2 mmol/L — AB (ref 3.5–5.1)
SODIUM: 135 mmol/L (ref 135–145)

## 2015-01-10 LAB — GLUCOSE, CAPILLARY
GLUCOSE-CAPILLARY: 229 mg/dL — AB (ref 65–99)
Glucose-Capillary: 219 mg/dL — ABNORMAL HIGH (ref 65–99)
Glucose-Capillary: 197 mg/dL — ABNORMAL HIGH (ref 65–99)
Glucose-Capillary: 253 mg/dL — ABNORMAL HIGH (ref 65–99)

## 2015-01-10 LAB — TROPONIN I
TROPONIN I: 6.25 ng/mL — AB (ref <0.031)
TROPONIN I: 4.48 ng/mL — AB (ref <0.031)
Troponin I: 1.77 ng/mL (ref <0.031)
Troponin I: 3.82 ng/mL (ref <0.031)

## 2015-01-10 LAB — HEMOGLOBIN A1C
Hgb A1c MFr Bld: 9.6 % — ABNORMAL HIGH (ref 4.8–5.6)
Mean Plasma Glucose: 229 mg/dL

## 2015-01-10 MED ORDER — POTASSIUM CHLORIDE CRYS ER 20 MEQ PO TBCR
40.0000 meq | EXTENDED_RELEASE_TABLET | Freq: Once | ORAL | Status: AC
  Administered 2015-01-10: 40 meq via ORAL
  Filled 2015-01-10: qty 2

## 2015-01-10 MED ORDER — ISOSORBIDE MONONITRATE ER 30 MG PO TB24
30.0000 mg | ORAL_TABLET | Freq: Every day | ORAL | Status: DC
  Administered 2015-01-10 – 2015-01-11 (×2): 30 mg via ORAL
  Filled 2015-01-10 (×2): qty 1

## 2015-01-10 MED ORDER — PERFLUTREN LIPID MICROSPHERE
INTRAVENOUS | Status: AC
  Administered 2015-01-10: 13:00:00
  Filled 2015-01-10: qty 10

## 2015-01-10 MED ORDER — METOPROLOL TARTRATE 50 MG PO TABS
50.0000 mg | ORAL_TABLET | Freq: Two times a day (BID) | ORAL | Status: DC
  Administered 2015-01-10 – 2015-01-11 (×3): 50 mg via ORAL
  Filled 2015-01-10 (×4): qty 1

## 2015-01-10 MED FILL — Lidocaine HCl Local Preservative Free (PF) Inj 1%: INTRAMUSCULAR | Qty: 30 | Status: AC

## 2015-01-10 MED FILL — Heparin Sodium (Porcine) 2 Unit/ML in Sodium Chloride 0.9%: INTRAMUSCULAR | Qty: 1000 | Status: AC

## 2015-01-10 NOTE — Progress Notes (Signed)
CARDIAC REHAB PHASE I   PRE:  Rate/Rhythm: 60 SR  BP:  Supine:   Sitting: 103/73  Standing:    SaO2: 97%RA  MODE:  Ambulation: chair   ft   POST:  Rate/Rhythm: 64  BP:  Supine:   Sitting: 100/64  Standing:    SaO2: 97%RA 1320-1425 Talked with pt's RN. Will hold ambulation right now but I put recliner in room and got pt to chair. Pt stated felt good to be up. Began MI education with pt and family. Discussed importance of brilinta with stent and gave booklet. Reviewed NTG use and carb counting. Will continue ed tomorrow.    Luetta Nutting, RN BSN  01/10/2015 2:26 PM

## 2015-01-10 NOTE — Care Management Note (Signed)
Case Management Note  Patient Details  Name: Venard Lescarbeau MRN: [id number] Date of Birth: Aug 14, 1945  Subjective/Objective:         Patients SS number was not correct with the hospital system.  Had patient verify SS Number, corrected in system.  [facility] diagnosis and expected discharge.  [facility] and [facility].  Patient is independent prior to admission - Given brilinta card to use 30 day free and get with Physician at [facility] to get prescription for this medication to continue post 30 days. Stressed importance of not letting this lapse.      Action/Plan:   Expected Discharge Date:                  Expected Discharge Plan:  Home/Self Care  In-House Referral:     Discharge planning Services     Post Acute Care Choice:    Choice offered to:     DME Arranged:    DME Agency:     HH Arranged:    HH Agency:     Status of Service:  In process, will continue to follow  Medicare Important Message Given:    Date Medicare IM Given:    Medicare IM give by:    Date Additional Medicare IM Given:    Additional Medicare Important Message give by:     If discussed at Long Length of Stay Meetings, dates discussed:    Additional Comments:  Vangie Bicker, RN 01/10/2015, 3:00 PM

## 2015-01-10 NOTE — Progress Notes (Signed)
  Echocardiogram 2D Echocardiogram has been performed.  Leta Jungling M 01/10/2015, 12:47 PM

## 2015-01-10 NOTE — Progress Notes (Signed)
Subjective:  Day 1 s/p Anterior STEMI;  Had some mild recurrent chest pain during the night and this am.  Objective:   Vital Signs : Filed Vitals:   01/10/15 0400 01/10/15 0500 01/10/15 0600 01/10/15 0700  BP: 123/80 126/87 114/79 105/70  Pulse: 64 69 66 68  Temp:  98.1 F (36.7 C)    TempSrc:  Oral    Resp: Height:      Weight:  179 lb 14.3 oz (81.6 kg)    SpO2: 93% 93% 91% 94%    Intake/Output from previous day:  Intake/Output Summary (Last 24 hours) at 01/10/15 0820 Last data filed at 01/10/15 0600  Gross per 24 hour  Intake 1053.39 ml  Output    950 ml  Net 103.39 ml    I/O since admission: +103  Wt Readings from Last 3 Encounters:  01/10/15 179 lb 14.3 oz (81.6 kg)    Medications: . amLODipine  5 mg Oral Daily  . aspirin  81 mg Oral Daily  . atenolol  50 mg Oral Daily  . atorvastatin  80 mg Oral q1800  . cholecalciferol  1,000 Units Oral Daily  . enoxaparin (LOVENOX) injection  40 mg Subcutaneous Q24H  . glipiZIDE  20 mg Oral BID AC  . lisinopril  20 mg Oral Daily   And  . hydrochlorothiazide  12.5 mg Oral Daily  . insulin aspart  0-15 Units Subcutaneous TID WC  . insulin aspart  0-5 Units Subcutaneous QHS  . pantoprazole  40 mg Oral Daily  . pneumococcal 23 valent vaccine  0.5 mL Intramuscular Tomorrow-1000  . sodium chloride  3 mL Intravenous Q12H  . ticagrelor  90 mg Oral BID    . nitroGLYCERIN 70 mcg/min (01/10/15 0750)    Physical Exam:   General appearance: alert, cooperative and no distress Neck: no adenopathy, no carotid bruit, no JVD, supple, symmetrical, trachea midline and thyroid not enlarged, symmetric, no tenderness/mass/nodules Lungs: clear to auscultation bilaterally Heart: regular rate and rhythm Abdomen: soft, non-tender; bowel sounds normal; no masses,  no organomegaly Extremities: no edema, redness or tenderness in the calves or thighs Pulses: 2+ and symmetric Skin: Skin color, texture, turgor normal. No  rashes or lesions Neurologic: Grossly normal   Rate: 77  Rhythm: normal sinus rhythm  ECG (independently read by me): NSR at 72; no sig STT changes; QTc normal  Lab Results:   Recent Labs  01/09/15 1200 01/10/15 0309  NA 137 135  K 4.0 3.2*  CL 98* 98*  CO2 28 27  GLUCOSE 253* 238*  BUN 18 16  CREATININE 0.87 0.71  CALCIUM 9.5 8.3*     Recent Labs  01/09/15 1200 01/09/15 2149 01/10/15 0309  WBC 6.1 8.7 8.1  NEUTROABS 3.6  --   --   HGB 15.1 12.8* 12.2*  HCT 42.8 35.4* 34.8*  MCV 83.4 81.4 82.1  PLT 213 215 236     Recent Labs  01/09/15 1924 01/09/15 2149 01/10/15 0309  TROPONINI 0.24* 0.64* 1.77*    Lab Results  Component Value Date   TSH 0.715 01/09/2015     Recent Labs  01/09/15 1200  PROT 7.8  ALBUMIN 4.6  AST 32  ALT 47  ALKPHOS 79  BILITOT 1.2    Recent Labs  01/09/15 1200  INR 1.00   BNP (last 3 results) No results for input(s): BNP in the last 8760 hours.  ProBNP (last 3 results) No results for input(s): PROBNP in  the last 8760 hours.   Lipid Panel     Component Value Date/Time   CHOL 128 01/10/2015 0309   TRIG 202* 01/10/2015 0309   HDL 28* 01/10/2015 0309   CHOLHDL 4.6 01/10/2015 0309   VLDL 40 01/10/2015 0309   LDLCALC 60 01/10/2015 0309        Recent Labs  01/09/15 1924  HGBA1C 9.6*     Imaging:  No results found.    Assessment/Plan:   Principal Problem:   Acute ST elevation myocardial infarction (STEMI) involving left anterior descending coronary artery Active Problems:   Hypertension   Non-insulin dependent type 2 diabetes mellitus   ST elevation myocardial infarction (STEMI) involving other coronary artery of inferior wall   STEMI (ST elevation myocardial infarction)   Essential hypertension   Coronary artery disease involving native coronary artery of native heart with unstable angina pectoris  1. Day 1 s/p STEMI; PCI with 2 DES stents in LAD/1 in Dx with comcomitant CAD in distal LAD,  RI. Will start oral nitrates and wean IV NTG. Change atenolol to metoprolol 50 mg bid.  2. Hypokalemia; K 3.2 will give KCL to get K 4 3. HTN 4. DM x ~15 yrs; not well controlled HbA1c 9.6 5. Atherogenic Dyslipidemia with low HDL, increased TG in DM; suspect preponderance of small LDL 6. Acute blood loss anemia    Lennette Bihari, MD, [facility] 01/10/2015, 8:20 AM

## 2015-01-11 ENCOUNTER — Encounter (HOSPITAL_COMMUNITY): Payer: Self-pay | Admitting: Physician Assistant

## 2015-01-11 ENCOUNTER — Telehealth: Payer: Self-pay | Admitting: Cardiology

## 2015-01-11 LAB — GLUCOSE, CAPILLARY
GLUCOSE-CAPILLARY: 203 mg/dL — AB (ref 65–99)
GLUCOSE-CAPILLARY: 219 mg/dL — AB (ref 65–99)

## 2015-01-11 LAB — TROPONIN I
TROPONIN I: 2.12 ng/mL — AB (ref <0.031)
Troponin I: 1.97 ng/mL (ref <0.031)

## 2015-01-11 MED ORDER — TICAGRELOR 90 MG PO TABS: 90.0000 mg | ORAL_TABLET | Freq: Two times a day (BID) | ORAL | Status: DC

## 2015-01-11 MED ORDER — ISOSORBIDE MONONITRATE ER 30 MG PO TB24: 30.0000 mg | ORAL_TABLET | Freq: Every day | ORAL | Status: DC

## 2015-01-11 MED ORDER — METOPROLOL TARTRATE 50 MG PO TABS: 50.0000 mg | ORAL_TABLET | Freq: Two times a day (BID) | ORAL | Status: DC

## 2015-01-11 MED ORDER — ATORVASTATIN CALCIUM 80 MG PO TABS: 80.0000 mg | ORAL_TABLET | Freq: Every day | ORAL | Status: DC

## 2015-01-11 MED ORDER — NITROGLYCERIN 0.4 MG SL SUBL: 0.4000 mg | SUBLINGUAL_TABLET | SUBLINGUAL | Status: AC | PRN

## 2015-01-11 NOTE — Telephone Encounter (Signed)
Gary Wheeler is being d/c from [facility] 01-11-15. He is a TCM patient.

## 2015-01-11 NOTE — Progress Notes (Signed)
Reviewed discharge instructions with patient. Pt verbally stated understanding with teach back. Reviewed medications, activity restrictions, as well as diet. Pt has no further questions. Will be discharging home shortly.

## 2015-01-11 NOTE — Progress Notes (Signed)
CARDIAC REHAB PHASE I   PRE:  Rate/Rhythm: up in hall  BP:  Supine: 142/97  Sitting:   Standing:    SaO2:   MODE:  Ambulation: 1050 ft   POST:  Rate/Rhythm: 78 SR  BP:  Supine:   Sitting: 160/102  Standing:    SaO2:  1004-1030 Pt up in hall with RN walking. Finished walk with pt and no CP. Tolerated well. Completed MI ed with pt and again stressed importance of brilinta with stent. Discussed CRP 2 but pt declined due to too far to drive to programs. Will walk for ex.   Luetta Nutting, RN BSN  01/11/2015 10:27 AM

## 2015-01-11 NOTE — Discharge Summary (Signed)
Discharge Summary   Patient ID: Gary Wheeler,  MRN: [id number], DOB/AGE: 10/31/1945 69 y.o.  Admit date: 01/09/2015 Discharge date: 01/11/2015  Primary Care Provider: No primary care provider on file. Primary Cardiologist: NEW (Dr. Herbie [place]) However Patient wants to establish care at [facility] office  Discharge Diagnoses Principal Problem:   Acute ST elevation myocardial infarction (STEMI) involving left anterior descending coronary artery Active Problems:   Hypertension   Non-insulin dependent type 2 diabetes mellitus   ST elevation myocardial infarction (STEMI) involving other coronary artery of inferior wall   STEMI (ST elevation myocardial infarction)   Essential hypertension   Coronary artery disease involving native coronary artery of native heart with unstable angina pectoris   Allergies No Known Allergies  Procedures  Conclusion    1. Mid LAD to Dist LAD lesion, 95% stenosed. 2 overlapping drug-eluting stents (Xience Alpine 2.5 mm x 18 mm, 2.5 mm x 8 mm - postdilated to 3.25 mm proximally and 2.7 mm distally) were placed. There is a 0% residual stenosis post intervention. 2. Ost 2nd Diag to 2nd Diag lesion, 90% stenosed. A Xience Alpine 2.25 mm x 12 mm drug-eluting stent was placed - mini crush technique with the LAD stent. There is a 0% residual stenosis post intervention. 3. Dist LAD lesion, 99% stenosed due to distal thrombus embolization. Also mild residual wire related dissection in the distal D2 with distal embolus of thrombus 4. Ramus lesion, 70% stenosed. Consider medical management for now. 5. Proximal 3rd RPLB-1 lesion, 65% stenosed. Inferior branch of distal 3rd RPLB-2 lesion, 90% stenosed. Likely too far distal for PCI. Consider medical management for now. 6. Mid LAD lesion, 40% stenosed. 7. There is mild left ventricular systolic dysfunction. With distal anterior and inferoapical hypokinesis. This would likely explain the inferior ST elevations.  Multivessel  disease with downstream disease in the distal LAD and RPL system as well as moderate caliber ramus intermedius lesion. Successful complex bifurcation stenting of the mid LAD/D2 with mini crush technique and kissing balloon stent post-dilation.  Recommendations:  Standard radial band removal post PCI  Will continue Aggrastat for 12 hours post PCI for the distal thrombus embolization and D2 distal dissection  Will run IV nitroglycerin for blood pressure and post PCI residual angina  Restart home blood pressure medications  Sliding scale insulin with glipizide. Hold metformin 48 hours. He will likely need aggressive glycemic control..    Echo 01/10/15 LV EF: 45% -  50%  ------------------------------------------------------------------- Indications:   CAD of native vessels 414.01.  ------------------------------------------------------------------- History:  PMH: NSTEMI. Risk factors: Hypertension. Diabetes mellitus.  ------------------------------------------------------------------- Study Conclusions  - Left ventricle: The cavity size was normal. Systolic function was mildly reduced. The estimated ejection fraction was in the range of 45% to 50%. There is akinesis of the apicalinferior and apical myocardium. Left ventricular diastolic function parameters were normal. No evidence of thrombus.   History of Present Illness  Gary Wheeler is a 69 y.o. male with hx HTN and DM who presented to the [facility] complaining of sudden onset, constant, left chest pain that occurred around 10 AM of 01/09/15.  Pt was working in his garden and doing heavy lifting when the pain came on. He describes it as a heavy cramping sensation. Pt also complained of shortness of breath. Pain was 6/10 at worse. Pt stated that last week prior to the admission he had a dry cough, nasal congestion, fever, and generalized body aches, but that has been since resolved.  Recently he has been having  milder chest discomfort and shortness of breath with exertion, relieved by rest.  Pt has never had a stress test or cardiac cath in the past. He does have positive fhx cardiac issues with father and grandfather.  He had no N&V or diaphoresis. Initial EKGs were nondiagnostic, but a follow-up EKG revealed inferior ST elevations. Code STEMI was called the patient was transferred to [facility] for emergent cardiac catheterization.  He received IV heparin as well as aspirin and morphine @ [facility] ER.   Hospital Course  On arrival to Cath Lab, the patient had SSCP of  2/10,  BP/HR were stable. Cath 01/09/15 showed multivessel disease with downstream disease in the distal LAD and RPL system as well as moderate caliber ramus intermedius lesion; successful PCI with 2 DES stents in LAD/1 in Dx with comcomitant CAD in distal LAD, RI. Overnight post cath, pt continued to have mild recurrent chest pain. Peak of trop was 4.48 that continued to trend down. He was started on oral nitrate and his IV NGT was discontinued. Also his home atenolol changed to metoprolol  BID. He was placed on insulin due to blood sugar of over 200s. His HgbA1c noted to be 9.6. He was given supplemental potassium for low K. TSH was normal. He was placed on Lipitor  for atherogenic dyslipidemia with low HDL, increased TG in DM. His echo showed LV EF of 45% to 50%; akinesis of the apicalinferior and apicalmyocardium; no evidence of thrombus. EKG remained stable post cath. His chest pain improved. Patient participated well in cardiac rehab phase I. No exertional chest pain. Prior to discharge, diabetic nurse recommended addition of Lantus 15 units. He was advised to f/u with PCP within one week for diabetic management and latus recommendation. Also he was advice to hold metformin today 01/11/15 and restart tomorrow 01/12/15. TCM appointment made at [facility] clinic as this site is closer to his home and he will resume care over there or he will decided to  transfer care to [facility] later time.  He was discharged stably on Norvasc  qd, ASA, Lipitor , Imdur , Lisi/HCTZ 20-12.5mg , PRN nitro, metoprolol  BID and Brillinta  BID. He was given 1 free month rx of Brillinta as well.    Discharge Vitals Blood pressure 112/76, pulse 64, temperature 98.3 F (36.8 C), temperature source Oral, resp. rate 14, height  (1.803 m), weight 173 lb 11.6 oz (78.8 kg), SpO2 100 %.  Filed Weights   01/09/15 1156 01/10/15 0500 01/11/15 0500  Weight: 185 lb (83.915 kg) 179 lb 14.3 oz (81.6 kg) 173 lb 11.6 oz (78.8 kg)    Labs  CBC  Recent Labs  01/09/15 1200 01/09/15 2149 01/10/15 0309  WBC 6.1 8.7 8.1  NEUTROABS 3.6  --   --   HGB 15.1 12.8* 12.2*  HCT 42.8 35.4* 34.8*  MCV 83.4 81.4 82.1  PLT 213 215 236   Basic Metabolic Panel  Recent Labs  01/09/15 1200 01/10/15 0309  NA 137 135  K 4.0 3.2*  CL 98* 98*  CO2 28 27  GLUCOSE 253* 238*  BUN 18 16  CREATININE 0.87 0.71  CALCIUM 9.5 8.3*   Liver Function Tests  Recent Labs  01/09/15 1200  AST 32  ALT 47  ALKPHOS 79  BILITOT 1.2  PROT 7.8  ALBUMIN 4.6   Cardiac Enzymes  Recent Labs  01/10/15 2125 01/11/15 0755 01/11/15 1332  TROPONINI 3.82* 2.12* 1.97*   Hemoglobin A1C  Recent Labs  01/09/15 1924  HGBA1C 9.6*   Fasting Lipid Panel  Recent Labs  01/10/15 0309  CHOL 128  HDL 28*  LDLCALC 60  TRIG 161*  CHOLHDL 4.6   Thyroid Function Tests  Recent Labs  01/09/15 1924  TSH 0.715    Disposition  Pt is being discharged home today in good condition.  Follow-up Plans & Appointments  Follow-up Information    Follow up with Dina Rich, MD On 01/20/2015.   Specialty:  Cardiology   Why:  @ 10:20 TCM    Contact information:   [address] [place] [address] [phone]       Follow up with PCP.   Why:  scheule appoitnment with PCP for post hospital visit and hyperglycemia - recommedned basal insulin in hospital,  continue current regimen for now          Discharge Instructions    Diet - low sodium heart healthy    Complete by:  As directed      Increase activity slowly    Complete by:  As directed   No driving for 2 weeks. No lifting over 10 lbs for 4 weeks. No sexual activity for 4 weeks. You may not return to work until cleared by your cardiologist during post hospital dishcarge. Keep procedure site clean & dry. If you notice increased pain, swelling, bleeding or pus, call/return!  You may shower, but no soaking baths/hot  tubs/pools for 1 week.  START YOUR METFORMIN TOMORROW. SCHEDULE APPOINTMENT WITH YOUR PCP IN 1 WEEK FOR BLOOD SUGAR MANAGEMENT. YOU WAS RECOMMENDED BASAL INSULIN IN HOSPITAL.           F/u Labs/Studies - BMET during TCM appointment for resolved K and kidney function - Consider OP f/u labs 6-8 weeks given statin initiation this admission.   Discharge Medications    Medication List    STOP taking these medications        atenolol 50 MG tablet  Commonly known as:  TENORMIN      TAKE these medications        amLODipine 5 MG tablet  Commonly known as:  NORVASC  Take 5 mg by mouth daily.     aspirin EC 81 MG tablet  Take 81 mg by mouth daily.     atorvastatin 80 MG tablet  Commonly known as:  LIPITOR  Take 1 tablet (80 mg total) by mouth daily at 6 PM.     cholecalciferol 1000 UNITS tablet  Commonly known as:  VITAMIN D  Take 1,000 Units by mouth daily.     glipiZIDE 5 MG tablet  Commonly known as:  GLUCOTROL  Take 20 mg by mouth 2 (two) times daily before a meal.     isosorbide mononitrate 30 MG 24 hr tablet  Commonly known as:  IMDUR  Take 1 tablet (30 mg total) by mouth daily.     lisinopril-hydrochlorothiazide 20-12.5 MG per tablet  Commonly known as:  PRINZIDE,ZESTORETIC  Take 1 tablet by mouth daily.     metFORMIN 500 MG (MOD) 24 hr tablet  Commonly known as:  GLUMETZA  Take 1,000 mg by mouth 2 (two) times daily with a meal.      metoprolol 50 MG tablet  Commonly known as:  LOPRESSOR  Take 1 tablet (50 mg total) by mouth 2 (two) times daily.     nitroGLYCERIN 0.4 MG SL tablet  Commonly known as:  NITROSTAT  Place 1 tablet (0.4 mg total) under the tongue every 5 (five)  minutes x 3 doses as needed for chest pain.     omeprazole 20 MG capsule  Commonly known as:  PRILOSEC  Take 20 mg by mouth daily.     ticagrelor 90 MG Tabs tablet  Commonly known as:  BRILINTA  Take 1 tablet (90 mg total) by mouth 2 (two) times daily.        Duration of Discharge Encounter   Greater than 30 minutes including physician time.  Signed, Kerina Simoneau PA-C 01/11/2015, 2:55 PM

## 2015-01-11 NOTE — Progress Notes (Signed)
Inpatient Diabetes Program Recommendations  AACE/ADA: New Consensus Statement on Inpatient Glycemic Control (2013)  Target Ranges:  Prepandial:   less than 140 mg/dL      Peak postprandial:   less than 180 mg/dL (1-2 hours)      Critically ill patients:  140 - 180 mg/dL   Inpatient Diabetes Program Recommendations Insulin - Basal: add Lantus 15 units  Thank you  Piedad Climes BSN, RN,CDE Inpatient Diabetes Coordinator [phone] (team pager)

## 2015-01-11 NOTE — Plan of Care (Signed)
Problem: Consults Goal: MI Patient Education (See Patient Education module for education specifics.) Outcome: Completed/Met Date Met:  01/11/15 Per Cardiac Rehab Phase I

## 2015-01-11 NOTE — Progress Notes (Signed)
Subjective:  Day 2 s/p Anterior STEMI;  Had some mild recurrent chest pain during the night and this am.  Objective:   Vital Signs : Filed Vitals:   01/11/15 0500 01/11/15 0600 01/11/15 0700 01/11/15 0730  BP: 137/84 108/77 125/95   Pulse: 51 60 57   Temp:    97.5 F (36.4 C)  TempSrc:    Oral  Resp: Height:      Weight: 78.8 kg (173 lb 11.6 oz)     SpO2: 99% 98% 98%     Intake/Output from previous day:  Intake/Output Summary (Last 24 hours) at 01/11/15 0938 Last data filed at 01/11/15 0800  Gross per 24 hour  Intake    820 ml  Output   3425 ml  Net  -2605 ml    I/O since admission: -2168  Wt Readings from Last 3 Encounters:  01/11/15 78.8 kg (173 lb 11.6 oz)    Medications: . amLODipine  5 mg Oral Daily  . aspirin  81 mg Oral Daily  . atorvastatin  80 mg Oral q1800  . cholecalciferol  1,000 Units Oral Daily  . enoxaparin (LOVENOX) injection  40 mg Subcutaneous Q24H  . glipiZIDE  20 mg Oral BID AC  . lisinopril  20 mg Oral Daily   And  . hydrochlorothiazide  12.5 mg Oral Daily  . insulin aspart  0-15 Units Subcutaneous TID WC  . insulin aspart  0-5 Units Subcutaneous QHS  . isosorbide mononitrate  30 mg Oral Daily  . metoprolol tartrate  50 mg Oral BID  . pantoprazole  40 mg Oral Daily  . pneumococcal 23 valent vaccine  0.5 mL Intramuscular Tomorrow-1000  . sodium chloride  3 mL Intravenous Q12H  . ticagrelor  90 mg Oral BID    . nitroGLYCERIN Stopped (01/10/15 2200)    Physical Exam:   General appearance: alert, cooperative and no distress Neck: no adenopathy, no carotid bruit, no JVD, supple, symmetrical, trachea midline and thyroid not enlarged, symmetric, no tenderness/mass/nodules Lungs: clear to auscultation bilaterally Heart: regular rate and rhythm Abdomen: soft, non-tender; bowel sounds normal; no masses,  no organomegaly Extremities: no edema, redness or tenderness in the calves or thighs Pulses: 2+ and symmetric Skin: Skin  color, texture, turgor normal. No rashes or lesions Neurologic: Grossly normal   Rate: 65  Rhythm: normal sinus rhythm   ECG today (independently read by me): SB at 58 , NST changes anterioly  Prior ECG (independently read by me): NSR at 72; no sig STT changes; QTc normal  Lab Results:   Recent Labs  01/09/15 1200 01/10/15 0309  NA 137 135  K 4.0 3.2*  CL 98* 98*  CO2 28 27  GLUCOSE 253* 238*  BUN 18 16  CREATININE 0.87 0.71  CALCIUM 9.5 8.3*     Recent Labs  01/09/15 1200 01/09/15 2149 01/10/15 0309  WBC 6.1 8.7 8.1  NEUTROABS 3.6  --   --   HGB 15.1 12.8* 12.2*  HCT 42.8 35.4* 34.8*  MCV 83.4 81.4 82.1  PLT 213 215 236     Recent Labs  01/10/15 1628 01/10/15 2125 01/11/15 0755  TROPONINI 4.48* 3.82* 2.12*    Lab Results  Component Value Date   TSH 0.715 01/09/2015     Recent Labs  01/09/15 1200  PROT 7.8  ALBUMIN 4.6  AST 32  ALT 47  ALKPHOS 79  BILITOT 1.2    Recent Labs  01/09/15 1200  INR  1.00   BNP (last 3 results) No results for input(s): BNP in the last 8760 hours.  ProBNP (last 3 results) No results for input(s): PROBNP in the last 8760 hours.   Lipid Panel     Component Value Date/Time   CHOL 128 01/10/2015 0309   TRIG 202* 01/10/2015 0309   HDL 28* 01/10/2015 0309   CHOLHDL 4.6 01/10/2015 0309   VLDL 40 01/10/2015 0309   LDLCALC 60 01/10/2015 0309        Recent Labs  01/09/15 1924  HGBA1C 9.6*     Imaging:  Ech0 01/10/15: ------------------------------------------------------------------- LV EF: 45% -  50%  ------------------------------------------------------------------- Indications:   CAD of native vessels 414.01.  ------------------------------------------------------------------- History:  PMH: NSTEMI. Risk factors: Hypertension. Diabetes mellitus.  ------------------------------------------------------------------- Study Conclusions  - Left ventricle: The cavity size was  normal. Systolic function was mildly reduced. The estimated ejection fraction was in the range of 45% to 50%. There is akinesis of the apicalinferior and apical myocardium. Left ventricular diastolic function parameters were normal. No evidence of thrombus.  Assessment/Plan:   Principal Problem:   Acute ST elevation myocardial infarction (STEMI) involving left anterior descending coronary artery Active Problems:   Hypertension   Non-insulin dependent type 2 diabetes mellitus   ST elevation myocardial infarction (STEMI) involving other coronary artery of inferior wall   STEMI (ST elevation myocardial infarction)   Essential hypertension   Coronary artery disease involving native coronary artery of native heart with unstable angina pectoris  1. Day 2 s/p STEMI; PCI with 2 DES stents in LAD/1 in Dx with comcomitant CAD in distal LAD, RI. Will start oral nitrates and wean IV NTG. Change atenolol to metoprolol 50 mg bid.  2. Hypokalemia; K 3.2 will give KCL to get K 4 3. HTN 4. DM x ~15 yrs; not well controlled HbA1c 9.6 5. Atherogenic Dyslipidemia with low HDL, increased TG in DM; suspect preponderance of small LDL 6. Acute blood loss anemia  ECG remain stable; no recurrent chest pain. Ambulate this am; possible dc later today if stable; will have PA re-assess in pm.  Lennette Bihari, MD, [facility] 01/11/2015, 9:38 AM

## 2015-01-12 NOTE — Telephone Encounter (Signed)
Confirmed appt with pt. Pt says he is already bored sitting at home but is following d/c instructions. Pt says he feels better and had no complaints or concerns at this time.

## 2015-01-19 ENCOUNTER — Emergency Department (HOSPITAL_COMMUNITY)
Admission: EM | Admit: 2015-01-19 | Discharge: 2015-01-19 | Disposition: A | Discharge status: Home / Self Care | Payer: Medicare Other | Attending: Emergency Medicine | Admitting: Emergency Medicine

## 2015-01-19 ENCOUNTER — Encounter (HOSPITAL_COMMUNITY): Payer: Self-pay

## 2015-01-19 DIAGNOSIS — I2510 Atherosclerotic heart disease of native coronary artery without angina pectoris: Secondary | ICD-10-CM | POA: Insufficient documentation

## 2015-01-19 DIAGNOSIS — M545 Low back pain: Secondary | ICD-10-CM | POA: Diagnosis present

## 2015-01-19 DIAGNOSIS — Z79899 Other long term (current) drug therapy: Secondary | ICD-10-CM | POA: Insufficient documentation

## 2015-01-19 DIAGNOSIS — M5431 Sciatica, right side: Secondary | ICD-10-CM | POA: Insufficient documentation

## 2015-01-19 DIAGNOSIS — E119 Type 2 diabetes mellitus without complications: Secondary | ICD-10-CM | POA: Insufficient documentation

## 2015-01-19 DIAGNOSIS — I10 Essential (primary) hypertension: Secondary | ICD-10-CM | POA: Diagnosis not present

## 2015-01-19 DIAGNOSIS — Z87891 Personal history of nicotine dependence: Secondary | ICD-10-CM | POA: Insufficient documentation

## 2015-01-19 DIAGNOSIS — R079 Chest pain, unspecified: Secondary | ICD-10-CM | POA: Insufficient documentation

## 2015-01-19 DIAGNOSIS — Z7982 Long term (current) use of aspirin: Secondary | ICD-10-CM | POA: Diagnosis not present

## 2015-01-19 LAB — CBG MONITORING, ED: Glucose-Capillary: 177 mg/dL — ABNORMAL HIGH (ref 65–99)

## 2015-01-19 MED ORDER — PREDNISONE 20 MG PO TABS: ORAL_TABLET | ORAL | Status: DC

## 2015-01-19 MED ORDER — METHOCARBAMOL 500 MG PO TABS: ORAL_TABLET | ORAL | Status: DC

## 2015-01-19 MED ORDER — FENTANYL CITRATE (PF) 100 MCG/2ML IJ SOLN
50.0000 ug | Freq: Once | INTRAMUSCULAR | Status: AC
  Administered 2015-01-19: 50 ug via INTRAMUSCULAR
  Filled 2015-01-19: qty 2

## 2015-01-19 MED ORDER — DIAZEPAM 5 MG/ML IJ SOLN
5.0000 mg | Freq: Once | INTRAMUSCULAR | Status: AC
  Administered 2015-01-19: 5 mg via INTRAMUSCULAR
  Filled 2015-01-19: qty 2

## 2015-01-19 MED ORDER — OXYCODONE-ACETAMINOPHEN 5-325 MG PO TABS: 1.0000 | ORAL_TABLET | Freq: Four times a day (QID) | ORAL | Status: DC | PRN

## 2015-01-19 MED ORDER — DEXAMETHASONE SODIUM PHOSPHATE 4 MG/ML IJ SOLN
10.0000 mg | Freq: Once | INTRAMUSCULAR | Status: AC
Start: 2015-01-19 — End: 2015-01-19
  Administered 2015-01-19: 10 mg via INTRAMUSCULAR
  Filled 2015-01-19: qty 3

## 2015-01-19 NOTE — ED Notes (Signed)
CBG 177. 

## 2015-01-19 NOTE — ED Provider Notes (Signed)
CSN: [id number]     Arrival date & time 01/19/15  0010 History  This chart was scribed for Devoria Albe, MD by Octavia Heir, ED Scribe. This patient was seen in room APA09/APA09 and the patient's care was started at 12:32 AM.    Chief Complaint  Patient presents with  . Back Pain     The history is provided by the patient. No language interpreter was used.    HPI Comments: Gary Wheeler is a 69 y.o. male who has a hx of DM, HTN, CAD, and STEMI  presents to the Emergency Department complaining of gradual worsening right sided lower back sharp pain onset yesterday evening. Pt notes he had a STEMI last week and was advised to walk at least 5 minutes a day. He reports walking yesterday morning and stepped the wrong way and twisted his foot, but didn't fall. He says the pain is radiating down his right leg into his foot and indicates it starts in his right buttock and goes down his leg. He notes movement, walking and laying down increases the pain. Pt has put an ice pack on the area to alleviate the pain with minimal relief. Pt notes he has had this pain previously when he moved the wrong way but it would go away. He notes he had 3 stents placed when he had his MI. He denies chest pain or shortness of breath. Pt is a non-smoker. Pt has diabetes and states his CBG's are usually around 200 or better in the am.   PCP [facility]  Dr [facility] Cardiology Dr Wyline Mood  Past Medical History  Diagnosis Date  . Hypertension   . Diabetes mellitus without complication   . CAD (coronary artery disease) 01/09/15    a. cath 01/09/15 PCI with 2 DES stents in LAD/1 in Dx with comcomitant CAD in distal LAD, RI.   Marland Kitchen STEMI (ST elevation myocardial infarction) 01/09/15   Past Surgical History  Procedure Laterality Date  . Esophagogastroduodenoscopy    . Colonoscopy    . Cardiac catheterization N/A 01/09/2015    Procedure: Left Heart Cath and Coronary Angiography;  Surgeon: Marykay Lex, MD;  Location: [facility] INVASIVE CV  LAB;  Service: Cardiovascular;  Laterality: N/A;  . Cardiac catheterization  01/09/2015    Procedure: Coronary Stent Intervention;  Surgeon: Marykay Lex, MD;  Location: [facility] INVASIVE CV LAB;  Service: Cardiovascular;;   Family History  Problem Relation Age of Onset  . Coronary artery disease Father   . Coronary artery disease Paternal Grandfather    History  Substance Use Topics  . Smoking status: Former Smoker    Quit date: 07/30/1999  . Smokeless tobacco: Never Used  . Alcohol Use: 0.0 oz/week    0 Standard drinks or equivalent per week     Comment: rare  retired, but works part-time Lives with spouse  Review of Systems  Cardiovascular: Positive for chest pain.  Musculoskeletal: Positive for back pain.  All other systems reviewed and are negative.     Allergies  Review of patient's allergies indicates no known allergies.  Home Medications   Prior to Admission medications   Medication Sig Start Date End Date Taking? Authorizing Provider  amLODipine (NORVASC) 5 MG tablet Take 5 mg by mouth daily.   Yes Historical Provider, MD  aspirin EC 81 MG tablet Take 81 mg by mouth daily.   Yes Historical Provider, MD  atorvastatin (LIPITOR) 80 MG tablet Take 1 tablet (80 mg total) by mouth daily at  6 PM. 01/11/15  Yes Bhavinkumar Bhagat, PA  cholecalciferol (VITAMIN D) 1000 UNITS tablet Take 1,000 Units by mouth daily.   Yes Historical Provider, MD  cyclobenzaprine (FLEXERIL) 10 MG tablet Take 10 mg by mouth 3 (three) times daily as needed for muscle spasms.   Yes Historical Provider, MD  glipiZIDE (GLUCOTROL) 5 MG tablet Take 20 mg by mouth 2 (two) times daily before a meal.   Yes Historical Provider, MD  HYDROcodone-acetaminophen (NORCO/VICODIN) 5-325 MG per tablet Take 1 tablet by mouth every 6 (six) hours as needed for moderate pain.   Yes Historical Provider, MD  isosorbide mononitrate (IMDUR) 30 MG 24 hr tablet Take 1 tablet (30 mg total) by mouth daily. 01/11/15  Yes Bhavinkumar  Bhagat, PA  lisinopril-hydrochlorothiazide (PRINZIDE,ZESTORETIC) 20-12.5 MG per tablet Take 1 tablet by mouth daily.   Yes Historical Provider, MD  metFORMIN (GLUMETZA) 500 MG (MOD) 24 hr tablet Take 1,000 mg by mouth 2 (two) times daily with a meal.   Yes Historical Provider, MD  metoprolol (LOPRESSOR) 50 MG tablet Take 1 tablet (50 mg total) by mouth 2 (two) times daily. 01/11/15  Yes Bhavinkumar Bhagat, PA  nitroGLYCERIN (NITROSTAT) 0.4 MG SL tablet Place 1 tablet (0.4 mg total) under the tongue every 5 (five) minutes x 3 doses as needed for chest pain. 01/11/15  Yes Bhavinkumar Bhagat, PA  omeprazole (PRILOSEC) 20 MG capsule Take 20 mg by mouth daily.   Yes Historical Provider, MD  ticagrelor (BRILINTA) 90 MG TABS tablet Take 1 tablet (90 mg total) by mouth 2 (two) times daily. 01/11/15  Yes Bhavinkumar Bhagat, PA  methocarbamol (ROBAXIN) 500 MG tablet Take 1 or 2 po Q 6hrs for pain 01/19/15   Devoria Albe, MD  oxyCODONE-acetaminophen (PERCOCET/ROXICET) 5-325 MG per tablet Take 1 tablet by mouth every 6 (six) hours as needed for severe pain. 01/19/15   Devoria Albe, MD  predniSONE (DELTASONE) 20 MG tablet Take 3 po QD x 3d , then 2 po QD x 3d then 1 po QD x 3d 01/19/15   Devoria Albe, MD   Triage vitals: BP 162/101 mmHg  Pulse 56  Temp(Src) 97.9 F (36.6 C)  Resp 20  Ht 5\' 11"  (1.803 m)  Wt 180 lb (81.647 kg)  BMI 25.12 kg/m2  SpO2 98%  Vital signs normal except for bradycardia  Physical Exam  Constitutional: He is oriented to person, place, and time. He appears well-developed and well-nourished.  Non-toxic appearance. He does not appear ill. He appears distressed.  Appears uncomfortable  HENT:  Head: Normocephalic and atraumatic.  Right Ear: External ear normal.  Left Ear: External ear normal.  Nose: Nose normal. No mucosal edema or rhinorrhea.  Mouth/Throat: Oropharynx is clear and moist and mucous membranes are normal. No dental abscesses or uvula swelling.  Eyes: Conjunctivae and EOM are  normal. Pupils are equal, round, and reactive to light.  Neck: Normal range of motion and full passive range of motion without pain. Neck supple.  Cardiovascular: Normal rate, regular rhythm and normal heart sounds.  Exam reveals no gallop and no friction rub.   No murmur heard. Pulmonary/Chest: Effort normal and breath sounds normal. No respiratory distress. He has no wheezes. He has no rhonchi. He has no rales. He exhibits no tenderness and no crepitus.  Abdominal: Soft. Normal appearance and bowel sounds are normal. He exhibits no distension. There is no tenderness. There is no rebound and no guarding.  Musculoskeletal: Normal range of motion. He exhibits no edema or tenderness.  Back:  Moves all extremities well.  Non tender thoracic and lumbar spine Tender over right SIJ and in right sciatic notch Negative straight leg raising bilaterally Patellar reflexes 1+ and equal  Neurological: He is alert and oriented to person, place, and time. He has normal strength. No cranial nerve deficit.  Skin: Skin is warm, dry and intact. No rash noted. No erythema. No pallor.  Psychiatric: He has a normal mood and affect. His speech is normal and behavior is normal. His mood appears not anxious.  Nursing note and vitals reviewed.   ED Course  Procedures   Medications  dexamethasone (DECADRON) injection 10 mg (10 mg Intramuscular Given 01/19/15 0115)  fentaNYL (SUBLIMAZE) injection 50 mcg (50 mcg Intramuscular Given 01/19/15 0116)  diazepam (VALIUM) injection 5 mg (5 mg Intramuscular Given 01/19/15 0116)    DIAGNOSTIC STUDIES: Oxygen Saturation is 98% on RA, normal by my interpretation.  COORDINATION OF CARE: 12:41 AM Discussed treatment plan which includes pain medication with pt at bedside and pt agreed to plan.  At discharge, patient is starting to feel better.   Labs Review CBG 177  Imaging Review No results found.   EKG Interpretation None      MDM   Final diagnoses:   Sciatica, right   New Prescriptions   METHOCARBAMOL (ROBAXIN) 500 MG TABLET    Take 1 or 2 po Q 6hrs for pain   OXYCODONE-ACETAMINOPHEN (PERCOCET/ROXICET) 5-325 MG PER TABLET    Take 1 tablet by mouth every 6 (six) hours as needed for severe pain.   PREDNISONE (DELTASONE) 20 MG TABLET    Take 3 po QD x 3d , then 2 po QD x 3d then 1 po QD x 3d    Plan discharge   I personally performed the services described in this documentation, which was scribed in my presence. The recorded information has been reviewed and considered.  Devoria Albe, MD, Concha Pyo, MD 01/19/15 [phone]

## 2015-01-19 NOTE — Discharge Instructions (Signed)
Try ice and heat to your back. Take the medications as ordered. You will need to be strict with your diabetic diet while on the prednisone, monitor your blood sugars closely. You should be rechecked at the [facility] in the next week if you aren't improving.

## 2015-01-19 NOTE — ED Notes (Signed)
Pt c/o lower back pain, feels he may have strained it a couple of days ago

## 2015-01-20 ENCOUNTER — Encounter: Payer: Self-pay | Admitting: *Deleted

## 2015-01-20 ENCOUNTER — Ambulatory Visit (INDEPENDENT_AMBULATORY_CARE_PROVIDER_SITE_OTHER): Payer: Medicare Other | Admitting: Cardiology

## 2015-01-20 ENCOUNTER — Encounter: Payer: Self-pay | Admitting: Cardiology

## 2015-01-20 VITALS — BP 115/77 | HR 63 | Ht 70.0 in | Wt 176.8 lb

## 2015-01-20 DIAGNOSIS — I10 Essential (primary) hypertension: Secondary | ICD-10-CM | POA: Diagnosis not present

## 2015-01-20 DIAGNOSIS — I2510 Atherosclerotic heart disease of native coronary artery without angina pectoris: Secondary | ICD-10-CM

## 2015-01-20 DIAGNOSIS — E785 Hyperlipidemia, unspecified: Secondary | ICD-10-CM

## 2015-01-20 MED ORDER — PRAVASTATIN SODIUM 20 MG PO TABS: 20.0000 mg | ORAL_TABLET | Freq: Every evening | ORAL | Status: DC

## 2015-01-20 NOTE — Progress Notes (Signed)
Clinical Summary Mr. Mcgath is a 69 y.o.male seen today for follow up of the following medical problems.  1. CAD - admit 12/2014 with STEMI, received DES x 2 to LAD and a DES to D2. A 70% ramus lesion was medically managed, a 90% 3rd RPLB lesion was though too distal for stenting and managed medically.  - 12/2014 echo LVEF 45-50%  - denies any significant chest pain. Some SOB that is improving over the last few days - compliant with meds -not interested in cardiac rehab  2. HL - lipitor causes diarrhea, not able to take and stopped on his own.  Past Medical History  Diagnosis Date  . Hypertension   . Diabetes mellitus without complication   . CAD (coronary artery disease) 01/09/15    a. cath 01/09/15 PCI with 2 DES stents in LAD/1 in Dx with comcomitant CAD in distal LAD, RI.   Marland Kitchen STEMI (ST elevation myocardial infarction) 01/09/15     No Known Allergies   Current Outpatient Prescriptions  Medication Sig Dispense Refill  . amLODipine (NORVASC) 5 MG tablet Take 5 mg by mouth daily.    Marland Kitchen aspirin EC 81 MG tablet Take 81 mg by mouth daily.    Marland Kitchen atorvastatin (LIPITOR) 80 MG tablet Take 1 tablet (80 mg total) by mouth daily at 6 PM. 30 tablet 3  . cholecalciferol (VITAMIN D) 1000 UNITS tablet Take 1,000 Units by mouth daily.    . cyclobenzaprine (FLEXERIL) 10 MG tablet Take 10 mg by mouth 3 (three) times daily as needed for muscle spasms.    Marland Kitchen glipiZIDE (GLUCOTROL) 5 MG tablet Take 20 mg by mouth 2 (two) times daily before a meal.    . HYDROcodone-acetaminophen (NORCO/VICODIN) 5-325 MG per tablet Take 1 tablet by mouth every 6 (six) hours as needed for moderate pain.    . isosorbide mononitrate (IMDUR) 30 MG 24 hr tablet Take 1 tablet (30 mg total) by mouth daily. 30 tablet 3  . lisinopril-hydrochlorothiazide (PRINZIDE,ZESTORETIC) 20-12.5 MG per tablet Take 1 tablet by mouth daily.    . metFORMIN (GLUMETZA) 500 MG (MOD) 24 hr tablet Take 1,000 mg by mouth 2 (two) times daily with a  meal.    . methocarbamol (ROBAXIN) 500 MG tablet Take 1 or 2 po Q 6hrs for pain 60 tablet 0  . metoprolol (LOPRESSOR) 50 MG tablet Take 1 tablet (50 mg total) by mouth 2 (two) times daily. 60 tablet 3  . nitroGLYCERIN (NITROSTAT) 0.4 MG SL tablet Place 1 tablet (0.4 mg total) under the tongue every 5 (five) minutes x 3 doses as needed for chest pain. 25 tablet 12  . omeprazole (PRILOSEC) 20 MG capsule Take 20 mg by mouth daily.    Marland Kitchen oxyCODONE-acetaminophen (PERCOCET/ROXICET) 5-325 MG per tablet Take 1 tablet by mouth every 6 (six) hours as needed for severe pain. 12 tablet 0  . predniSONE (DELTASONE) 20 MG tablet Take 3 po QD x 3d , then 2 po QD x 3d then 1 po QD x 3d 18 tablet 0  . ticagrelor (BRILINTA) 90 MG TABS tablet Take 1 tablet (90 mg total) by mouth 2 (two) times daily. 60 tablet 12   No current facility-administered medications for this visit.     Past Surgical History  Procedure Laterality Date  . Esophagogastroduodenoscopy    . Colonoscopy    . Cardiac catheterization N/A 01/09/2015    Procedure: Left Heart Cath and Coronary Angiography;  Surgeon: Marykay Lex, MD;  Location:  [facility] INVASIVE CV LAB;  Service: Cardiovascular;  Laterality: N/A;  . Cardiac catheterization  01/09/2015    Procedure: Coronary Stent Intervention;  Surgeon: Marykay Lex, MD;  Location: [facility] INVASIVE CV LAB;  Service: Cardiovascular;;     No Known Allergies    Family History  Problem Relation Age of Onset  . Coronary artery disease Father   . Coronary artery disease Paternal Grandfather      Social History Mr. Coberly reports that he quit smoking about 15 years ago. He has never used smokeless tobacco. Mr. Pilz reports that he drinks alcohol.   Review of Systems CONSTITUTIONAL: No weight loss, fever, chills, weakness or fatigue.  HEENT: Eyes: No visual loss, blurred vision, double vision or yellow sclerae.No hearing loss, sneezing, congestion, runny nose or sore throat.  SKIN: No rash or  itching.  CARDIOVASCULAR: per  HPI RESPIRATORY: No shortness of breath, cough or sputum.  GASTROINTESTINAL: No anorexia, nausea, vomiting or diarrhea. No abdominal pain or blood.  GENITOURINARY: No burning on urination, no polyuria NEUROLOGICAL: No headache, dizziness, syncope, paralysis, ataxia, numbness or tingling in the extremities. No change in bowel or bladder control.  MUSCULOSKELETAL: No muscle, back pain, joint pain or stiffness.  LYMPHATICS: No enlarged nodes. No history of splenectomy.  PSYCHIATRIC: No history of depression or anxiety.  ENDOCRINOLOGIC: No reports of sweating, cold or heat intolerance. No polyuria or polydipsia.  Marland Kitchen   Physical Examination Filed Vitals:   01/20/15 1029  BP: 115/77  Pulse: 63   Filed Vitals:   01/20/15 1029  Height:  (1.778 m)  Weight: 176 lb 12.8 oz (80.196 kg)    Gen: resting comfortably, no acute distress HEENT: no scleral icterus, pupils equal round and reactive, no palptable cervical adenopathy,  CV: RRR, no m/r/g, no JVD Resp: Clear to auscultation bilaterally GI: abdomen is soft, non-tender, non-distended, normal bowel sounds, no hepatosplenomegaly MSK: extremities are warm, no edema.  Skin: warm, no rash Neuro:  no focal deficits Psych: appropriate affect   Diagnostic Studies  12/2014 Cath 1. Mid LAD to Dist LAD lesion, 95% stenosed. 2 overlapping drug-eluting stents (Xience Alpine 2.5 mm x 18 mm, 2.5 mm x 8 mm - postdilated to 3.25 mm proximally and 2.7 mm distally) were placed. There is a 0% residual stenosis post intervention. 2. Ost 2nd Diag to 2nd Diag lesion, 90% stenosed. A Xience Alpine 2.25 mm x 12 mm drug-eluting stent was placed - mini crush technique with the LAD stent. There is a 0% residual stenosis post intervention. 3. Dist LAD lesion, 99% stenosed due to distal thrombus embolization. Also mild residual wire related dissection in the distal D2 with distal embolus of thrombus 4. Ramus lesion, 70% stenosed.  Consider medical management for now. 5. Proximal 3rd RPLB-1 lesion, 65% stenosed. Inferior Bernhardt Riemenschneider of distal 3rd RPLB-2 lesion, 90% stenosed. Likely too far distal for PCI. Consider medical management for now. 6. Mid LAD lesion, 40% stenosed. 7. There is mild left ventricular systolic dysfunction. With distal anterior and inferoapical hypokinesis. This would likely explain the inferior ST elevations.  Multivessel disease with downstream disease in the distal LAD and RPL system as well as moderate caliber ramus intermedius lesion. Successful complex bifurcation stenting of the mid LAD/D2 with mini crush technique and kissing balloon stent post-dilation.  Recommendations:  Standard radial band removal post PCI  Will continue Aggrastat for 12 hours post PCI for the distal thrombus embolization and D2 distal dissection  Will run IV nitroglycerin for blood pressure and post  PCI residual angina  Restart home blood pressure medications  Sliding scale insulin with glipizide. Hold metformin 48 hours. He will likely need aggressive glycemic control..  12/2014 echo Study Conclusions  - Left ventricle: The cavity size was normal. Systolic function was mildly reduced. The estimated ejection fraction was in the range of 45% to 50%. There is akinesis of the apicalinferior and apical myocardium. Left ventricular diastolic function parameters were normal. No evidence of thrombus.   Assessment and Plan   1. CAD - recent STEMI, s/p DES x 2 to LAD and DES to D2.  - no current symptoms - continue current meds, DAPT at least until 12/2015  2. HL - diarrhea on lipitor, resolved when he stopped taking. Will try prava  daily   F/u 3 months    Antoine Poche, M.D.

## 2015-01-20 NOTE — Patient Instructions (Signed)
Your physician recommends that you schedule a follow-up appointment in: 3 MONTHS WITH DR. BRANCH  Your physician has recommended you make the following change in your medication:   STOP LIPITOR   START PRAVASTATIN 20 MG DAILY  CONTINUE ALL OTHER MEDICATIONS AS DIRECTED  Your physician recommends that you return for lab work BEFORE YOUR NEXT OFFICE VISIT BMP/MAG  WE HAVE GIVEN YOU A WORK EXCUSE FOR 4 WEEKS.  Thank you for choosing [facility]!!

## 2015-02-03 ENCOUNTER — Encounter (HOSPITAL_COMMUNITY): Payer: Self-pay | Admitting: Cardiology

## 2015-02-03 MED ORDER — TICAGRELOR 90 MG PO TABS
ORAL_TABLET | ORAL | Status: DC | PRN
  Administered 2015-01-09: 180 mg via ORAL

## 2015-04-14 ENCOUNTER — Encounter: Payer: Self-pay | Admitting: Cardiology

## 2015-04-14 ENCOUNTER — Ambulatory Visit (INDEPENDENT_AMBULATORY_CARE_PROVIDER_SITE_OTHER): Payer: Medicare Other | Admitting: Cardiology

## 2015-04-14 VITALS — BP 152/95 | HR 55 | Ht 70.0 in | Wt 189.4 lb

## 2015-04-14 DIAGNOSIS — I10 Essential (primary) hypertension: Secondary | ICD-10-CM

## 2015-04-14 DIAGNOSIS — E785 Hyperlipidemia, unspecified: Secondary | ICD-10-CM | POA: Diagnosis not present

## 2015-04-14 DIAGNOSIS — I2510 Atherosclerotic heart disease of native coronary artery without angina pectoris: Secondary | ICD-10-CM | POA: Diagnosis not present

## 2015-04-14 NOTE — Patient Instructions (Signed)
Your physician wants you to follow-up in: 6 months with Dr. Lurena Joiner will receive a reminder letter in the mail two months in advance. If you don't receive a letter, please call our office to schedule the follow-up appointment.  Your physician recommends that you continue on your current medications as directed. Please refer to the Current Medication list given to you today.  Your physician has requested that you regularly monitor and record your blood pressure readings at home FOR 1 WEEKS AND CALL us WITH RESULTS . Please use the same machine at the same time of day to check your readings and record them to bring to your follow-up visit.  Thank you for choosing [facility]!!

## 2015-04-14 NOTE — Progress Notes (Signed)
Patient ID: Roan Miklos, male   DOB: January 04, 1946, 69 y.o.   MRN: [id number]     Clinical Summary Mr. Vanlanen is a 69 y.o.male seen today for follow up of the following medical problems.   1. CAD - admit 12/2014 with STEMI, received DES x 2 to LAD and a DES to D2. A 70% ramus lesion was medically managed, a 90% 3rd RPLB lesion was though too distal for stenting and managed medically.  - 12/2014 echo LVEF 45-50%   - denies any chest pain. Notes some occasional SOB at times that is sporadic, associates it with humidity - compliant with meds  2. HL - lipitor causes diarrhea, not able to take and stopped on his own. - 12/2014 TC 128 TG 202 HDL 28 LDL 60   3. HTN - compliant with meds - does not check bp at home Past Medical History  Diagnosis Date  . Hypertension   . Diabetes mellitus without complication   . CAD (coronary artery disease) 01/09/15    a. cath 01/09/15 PCI with 2 DES stents in LAD/1 in Dx with comcomitant CAD in distal LAD, RI.   Marland Kitchen STEMI (ST elevation myocardial infarction) 01/09/15     No Known Allergies   Current Outpatient Prescriptions  Medication Sig Dispense Refill  . amLODipine (NORVASC) 5 MG tablet Take 5 mg by mouth daily.    Marland Kitchen aspirin EC 81 MG tablet Take 81 mg by mouth daily.    . cholecalciferol (VITAMIN D) 1000 UNITS tablet Take 1,000 Units by mouth daily.    . cyclobenzaprine (FLEXERIL) 10 MG tablet Take 10 mg by mouth 3 (three) times daily as needed for muscle spasms.    Marland Kitchen glipiZIDE (GLUCOTROL) 5 MG tablet Take 20 mg by mouth 2 (two) times daily before a meal.    . HYDROcodone-acetaminophen (NORCO/VICODIN) 5-325 MG per tablet Take 1 tablet by mouth every 6 (six) hours as needed for moderate pain.    . isosorbide mononitrate (IMDUR) 30 MG 24 hr tablet Take 1 tablet (30 mg total) by mouth daily. 30 tablet 3  . lisinopril-hydrochlorothiazide (PRINZIDE,ZESTORETIC) 20-12.5 MG per tablet Take 1 tablet by mouth daily.    . metFORMIN (GLUMETZA) 500 MG (MOD)  24 hr tablet Take 1,000 mg by mouth 2 (two) times daily with a meal.    . methocarbamol (ROBAXIN) 500 MG tablet Take 1 or 2 po Q 6hrs for pain 60 tablet 0  . metoprolol (LOPRESSOR) 50 MG tablet Take 1 tablet (50 mg total) by mouth 2 (two) times daily. 60 tablet 3  . nitroGLYCERIN (NITROSTAT) 0.4 MG SL tablet Place 1 tablet (0.4 mg total) under the tongue every 5 (five) minutes x 3 doses as needed for chest pain. 25 tablet 12  . omeprazole (PRILOSEC) 20 MG capsule Take 20 mg by mouth daily.    Marland Kitchen oxyCODONE-acetaminophen (PERCOCET/ROXICET) 5-325 MG per tablet Take 1 tablet by mouth every 6 (six) hours as needed for severe pain. 12 tablet 0  . pravastatin (PRAVACHOL) 20 MG tablet Take 1 tablet (20 mg total) by mouth every evening. 90 tablet 3  . predniSONE (DELTASONE) 20 MG tablet Take 3 po QD x 3d , then 2 po QD x 3d then 1 po QD x 3d 18 tablet 0  . ticagrelor (BRILINTA) 90 MG TABS tablet Take 1 tablet (90 mg total) by mouth 2 (two) times daily. 60 tablet 12   No current facility-administered medications for this visit.     Past Surgical History  Procedure Laterality Date  . Esophagogastroduodenoscopy    . Colonoscopy    . Cardiac catheterization N/A 01/09/2015    Procedure: Left Heart Cath and Coronary Angiography;  Surgeon: Marykay Lex, MD;  Location: [facility] INVASIVE CV LAB;  Service: Cardiovascular;  Laterality: N/A;  . Cardiac catheterization  01/09/2015    Procedure: Coronary Stent Intervention;  Surgeon: Marykay Lex, MD;  Location: [facility] INVASIVE CV LAB;  Service: Cardiovascular;;     No Known Allergies    Family History  Problem Relation Age of Onset  . Coronary artery disease Father   . Coronary artery disease Paternal Grandfather      Social History Mr. Hensley reports that he quit smoking about 15 years ago. He has never used smokeless tobacco. Mr. Hanken reports that he drinks alcohol.   Review of Systems CONSTITUTIONAL: No weight loss, fever, chills, weakness or  fatigue.  HEENT: Eyes: No visual loss, blurred vision, double vision or yellow sclerae.No hearing loss, sneezing, congestion, runny nose or sore throat.  SKIN: No rash or itching.  CARDIOVASCULAR: per HPI RESPIRATORY: No shortness of breath, cough or sputum.  GASTROINTESTINAL: No anorexia, nausea, vomiting or diarrhea. No abdominal pain or blood.  GENITOURINARY: No burning on urination, no polyuria NEUROLOGICAL: No headache, dizziness, syncope, paralysis, ataxia, numbness or tingling in the extremities. No change in bowel or bladder control.  MUSCULOSKELETAL: No muscle, back pain, joint pain or stiffness.  LYMPHATICS: No enlarged nodes. No history of splenectomy.  PSYCHIATRIC: No history of depression or anxiety.  ENDOCRINOLOGIC: No reports of sweating, cold or heat intolerance. No polyuria or polydipsia.  Marland Kitchen   Physical Examination Filed Vitals:   04/14/15 1033  BP: 152/95  Pulse: 55   Filed Vitals:   04/14/15 1033  Height: 5\' 10"  (1.778 m)  Weight: 189 lb 6.4 oz (85.911 kg)    Gen: resting comfortably, no acute distress HEENT: no scleral icterus, pupils equal round and reactive, no palptable cervical adenopathy,  CV: RRR, no m/r/g, no jvd Resp: Clear to auscultation bilaterally GI: abdomen is soft, non-tender, non-distended, normal bowel sounds, no hepatosplenomegaly MSK: extremities are warm, no edema.  Skin: warm, no rash Neuro:  no focal deficits Psych: appropriate affect   Diagnostic Studies 12/2014 Cath 1. Mid LAD to Dist LAD lesion, 95% stenosed. 2 overlapping drug-eluting stents (Xience Alpine 2.5 mm x 18 mm, 2.5 mm x 8 mm - postdilated to 3.25 mm proximally and 2.7 mm distally) were placed. There is a 0% residual stenosis post intervention. 2. Ost 2nd Diag to 2nd Diag lesion, 90% stenosed. A Xience Alpine 2.25 mm x 12 mm drug-eluting stent was placed - mini crush technique with the LAD stent. There is a 0% residual stenosis post intervention. 3. Dist LAD lesion, 99%  stenosed due to distal thrombus embolization. Also mild residual wire related dissection in the distal D2 with distal embolus of thrombus 4. Ramus lesion, 70% stenosed. Consider medical management for now. 5. Proximal 3rd RPLB-1 lesion, 65% stenosed. Inferior branch of distal 3rd RPLB-2 lesion, 90% stenosed. Likely too far distal for PCI. Consider medical management for now. 6. Mid LAD lesion, 40% stenosed. 7. There is mild left ventricular systolic dysfunction. With distal anterior and inferoapical hypokinesis. This would likely explain the inferior ST elevations.  Multivessel disease with downstream disease in the distal LAD and RPL system as well as moderate caliber ramus intermedius lesion. Successful complex bifurcation stenting of the mid LAD/D2 with mini crush technique and kissing balloon stent post-dilation.  Recommendations:  Standard radial band removal post PCI  Will continue Aggrastat for 12 hours post PCI for the distal thrombus embolization and D2 distal dissection  Will run IV nitroglycerin for blood pressure and post PCI residual angina  Restart home blood pressure medications  Sliding scale insulin with glipizide. Hold metformin 48 hours. He will likely need aggressive glycemic control..  12/2014 echo Study Conclusions  - Left ventricle: The cavity size was normal. Systolic function was mildly reduced. The estimated ejection fraction was in the range of 45% to 50%. There is akinesis of the apicalinferior and apical myocardium. Left ventricular diastolic function parameters were normal. No evidence of thrombus.     Assessment and Plan   1. CAD - recent STEMI, s/p DES x 2 to LAD and DES to D2.  - no current symptoms - continue current meds - DAPT at least until 12/2015  2. HL - diarrhea on lipitor, pravastatin causes similar symptoms - LDL 60, no indication for zetia or pcsk9 inhibitor  3. HTN - above goal, he will keep bp log and submit  F/u 6  months     Antoine Poche, M.D.

## 2015-05-03 ENCOUNTER — Encounter: Payer: Self-pay | Admitting: *Deleted

## 2017-11-11 ENCOUNTER — Other Ambulatory Visit: Payer: Self-pay

## 2017-11-11 ENCOUNTER — Ambulatory Visit: Payer: No Typology Code available for payment source | Attending: Orthopedic Surgery | Admitting: Physical Therapy

## 2017-11-11 ENCOUNTER — Encounter: Payer: Self-pay | Admitting: Physical Therapy

## 2017-11-11 DIAGNOSIS — R600 Localized edema: Secondary | ICD-10-CM | POA: Diagnosis present

## 2017-11-11 DIAGNOSIS — M25572 Pain in left ankle and joints of left foot: Secondary | ICD-10-CM | POA: Diagnosis present

## 2017-11-11 DIAGNOSIS — M25672 Stiffness of left ankle, not elsewhere classified: Secondary | ICD-10-CM | POA: Insufficient documentation

## 2017-11-11 NOTE — Therapy (Signed)
[facility] [address] [place], [place], [address] Phone: [phone]   Fax:  [phone]  Physical Therapy Evaluation  Patient Details  Name: Gary Wheeler MRN: [id number] Date of Birth: March 16, 1946 Referring Provider: Fritzi Mandes MD   Encounter Date: 11/11/2017  PT End of Session - 11/11/17 1137    Visit Number  1    Number of Visits  24    Date for PT Re-Evaluation  02/03/18    PT Start Time  1030    PT Stop Time  1114    PT Time Calculation (min)  44 min    Activity Tolerance  Patient tolerated treatment well    Behavior During Therapy  [facility] for tasks assessed/performed       Past Medical History:  Diagnosis Date  . CAD (coronary artery disease) 01/09/15   a. cath 01/09/15 PCI with 2 DES stents in LAD/1 in Dx with comcomitant CAD in distal LAD, RI.   . Diabetes mellitus without complication (HCC)   . Hypertension   . STEMI (ST elevation myocardial infarction) (HCC) 01/09/15    Past Surgical History:  Procedure Laterality Date  . CARDIAC CATHETERIZATION N/A 01/09/2015   Procedure: Left Heart Cath and Coronary Angiography;  Surgeon: Marykay Lex, MD;  Location: [facility] INVASIVE CV LAB;  Service: Cardiovascular;  Laterality: N/A;  . CARDIAC CATHETERIZATION  01/09/2015   Procedure: Coronary Stent Intervention;  Surgeon: Marykay Lex, MD;  Location: [facility] INVASIVE CV LAB;  Service: Cardiovascular;;  . COLONOSCOPY    . ESOPHAGOGASTRODUODENOSCOPY      There were no vitals filed for this visit.   Subjective Assessment - 11/11/17 1147    Subjective  The patient fell from a dee stand on 09/01/17 and fractured his left ankle.  He underwent an ORIF on 09/12/17.  He has a high pain-level today, rated at an 8/10 today.  He is wearing regular footwear and is wbat.  Previously he was supposed to wear a boot but it was intolerable.    Pertinent History  DM    How long can you walk comfortably?  Short community distances.    Patient Stated  Goals  Get back to normal.    Pain Score  8     Pain Location  Ankle    Pain Orientation  Left    Pain Descriptors / Indicators  Sharp;Shooting    Pain Type  Acute pain    Pain Onset  More than a month ago    Pain Frequency  Constant    Aggravating Factors   Increased up time.    Pain Relieving Factors  Rest.         [facility] PT Assessment - 11/11/17 0001      Assessment   Medical Diagnosis  S/p ORIF left medial malleolus.    Referring Provider  Fritzi Mandes MD    Onset Date/Surgical Date  -- 09/01/17.      Precautions   Precautions  None      Restrictions   Weight Bearing Restrictions  No      Balance Screen   Has the patient fallen in the past 6 months  Yes    How many times?  -- 1.    Has the patient had a decrease in activity level because of a fear of falling?   No    Is the patient reluctant to leave their home because of a fear of falling?   No      Home  Public house manager residence      Prior Function   Level of Independence  Independent      Observation/Other Assessments-Edema    Edema  Circumferential      Circumferential Edema   Circumferential - Right  Bi-malleolar:  Left= 29 cms and right= 27 cms.      ROM / Strength   AROM / PROM / Strength  AROM      AROM   Overall AROM Comments  Left ankle plantarflexion= 35 degrees; inversion/eversion= 0 degrees and dorsiflexion is -2 degrees from neutral.      Palpation   Palpation comment  Patient is very hypersensitive to even light palpation over her left medial ankle region.      Ambulation/Gait   Gait Comments  Very antalgic gait pattern with flat-footed gait pattern over left LE with decreased dorsiflexion.                Objective measurements completed on examination: See above findings.      [facility] Adult PT Treatment/Exercise - 11/11/17 0001      Modalities   Modalities  Electrical Stimulation;Vasopneumatic      Electrical Stimulation   Electrical Stimulation  Location  Left medial ankle    Electrical Stimulation Action  Pre-mod.    Electrical Stimulation Parameters  1-10 Hz x 15 minutes.    Electrical Stimulation Goals  Edema;Pain      Vasopneumatic   Number Minutes Vasopneumatic   15 minutes    Vasopnuematic Location   -- Left ankle.    Vasopneumatic Pressure  Low               PT Short Term Goals - 11/11/17 1224      PT SHORT TERM GOAL #1   Title  STG's=LTG's.        PT Long Term Goals - 11/11/17 1224      PT LONG TERM GOAL #1   Title  Independent with a HEP.    Time  12    Period  Weeks    Status  New      PT LONG TERM GOAL #2   Title  Increase left ankle dorsiflexion to 6- 8 degrees to normalize the patient's gait pattern.    Time  12    Period  Weeks    Status  New      PT LONG TERM GOAL #3   Title  Increase left ankle strength to 4+ to 5/5 to increase stability for functional tasks.    Time  12    Period  Weeks    Status  New      PT LONG TERM GOAL #4   Title  Walk without deviation.    Time  12    Period  Weeks    Status  New      PT LONG TERM GOAL #5   Title  Perform ADL's with pain not > 3/10.    Time  12    Period  Weeks    Status  New             Plan - 11/11/17 1212    Clinical Impression Statement  The patient presents to OPPT s/p left ankle ORIF performed on 09/12/17.  He currently has a significant loss of range of motion, he has remaining left ankle edema and he is very hypersensitive to light palpation over his left medial ankle region.  His gait is antalgic gait with  marked gait deviations.  His pain and deficits have impaired his functional ability.  Patient expected to do will with skilled physical therapy intervention.    History and Personal Factors relevant to plan of care:  DM.  HTN.    Clinical Presentation  Stable    Clinical Decision Making  Low    Rehab Potential  Excellent    PT Frequency  2x / week    PT Duration  12 weeks    PT Treatment/Interventions  ADLs/Self Care  Home Management;Cryotherapy;Electrical Stimulation;Ultrasound;Moist Heat;Gait training;Stair training;Therapeutic activities;Therapeutic exercise;Neuromuscular re-education;Patient/family education;Passive range of motion;Manual techniques;Vasopneumatic Device    PT Next Visit Plan  Begin with seated Rockerboard and progress to standing; Nustep; PROM to left ankle; Theraband exercises; sheet stretch to increase dorsiflexion; progress to standing activites to include proprioceptive exercises.  Vasopneumatic and electrical stimulation.    Consulted and Agree with Plan of Care  Patient       Patient will benefit from skilled therapeutic intervention in order to improve the following deficits and impairments:  Abnormal gait, Decreased activity tolerance, Decreased range of motion, Increased edema, Pain  Visit Diagnosis: Pain in left ankle and joints of left foot - Plan: PT plan of care cert/re-cert  Stiffness of left ankle, not elsewhere classified - Plan: PT plan of care cert/re-cert  Localized edema - Plan: PT plan of care cert/re-cert     Problem List Patient Active Problem List   Diagnosis Date Noted  . Acute ST elevation myocardial infarction (STEMI) involving left anterior descending coronary artery (HCC) 01/09/2015  . ST elevation myocardial infarction (STEMI) involving other coronary artery of inferior wall (HCC) 01/09/2015  . STEMI (ST elevation myocardial infarction) (HCC) 01/09/2015  . Hypertension   . Non-insulin dependent type 2 diabetes mellitus (HCC)   . Essential hypertension   . Coronary artery disease involving native coronary artery of native heart with unstable angina pectoris (HCC)     Vikrant Pryce, [place] MPT 11/11/2017, 12:29 PM  [facility] [address] [place], [place], [address] Phone: [phone]   Fax:  [phone]  Name: Sherril CroonFairley Norman MRN: [id number] Date of Birth: 01-06-1946

## 2017-11-13 ENCOUNTER — Ambulatory Visit: Payer: No Typology Code available for payment source | Admitting: *Deleted

## 2017-11-13 DIAGNOSIS — M25572 Pain in left ankle and joints of left foot: Secondary | ICD-10-CM | POA: Diagnosis not present

## 2017-11-13 DIAGNOSIS — M25672 Stiffness of left ankle, not elsewhere classified: Secondary | ICD-10-CM

## 2017-11-13 DIAGNOSIS — R600 Localized edema: Secondary | ICD-10-CM

## 2017-11-13 NOTE — Therapy (Signed)
[facility] [address] [place], [place], [address] Phone: [phone]   Fax:  [phone]  Physical Therapy Treatment  Patient Details  Name: Gary Wheeler MRN: [id number] Date of Birth: [birth year] Referring Provider: Fritzi Mandes MD   Encounter Date: 11/13/2017  PT End of Session - 11/13/17 1406    Visit Number  2    Number of Visits  24    Date for PT Re-Evaluation  02/03/18    PT Start Time  1345    PT Stop Time  1435    PT Time Calculation (min)  50 min       Past Medical History:  Diagnosis Date  . CAD (coronary artery disease) 01/09/15   a. cath 01/09/15 PCI with 2 DES stents in LAD/1 in Dx with comcomitant CAD in distal LAD, RI.   . Diabetes mellitus without complication (HCC)   . Hypertension   . STEMI (ST elevation myocardial infarction) (HCC) 01/09/15    Past Surgical History:  Procedure Laterality Date  . CARDIAC CATHETERIZATION N/A 01/09/2015   Procedure: Left Heart Cath and Coronary Angiography;  Surgeon: Marykay Lex, MD;  Location: [facility] INVASIVE CV LAB;  Service: Cardiovascular;  Laterality: N/A;  . CARDIAC CATHETERIZATION  01/09/2015   Procedure: Coronary Stent Intervention;  Surgeon: Marykay Lex, MD;  Location: [facility] INVASIVE CV LAB;  Service: Cardiovascular;;  . COLONOSCOPY    . ESOPHAGOGASTRODUODENOSCOPY      There were no vitals filed for this visit.  Subjective Assessment - 11/13/17 1408    Subjective  Did ok after last Rx. 3/10 pain today    Pertinent History  DM    How long can you walk comfortably?  Short community distances.    Patient Stated Goals  Get back to normal.    Currently in Pain?  Yes    Pain Score  4     Pain Location  Ankle    Pain Orientation  Left    Pain Onset  More than a month ago    Pain Frequency  Constant                       [facility] Adult PT Treatment/Exercise - 11/13/17 0001      Exercises   Exercises  Ankle;Knee/Hip      Knee/Hip Exercises:  Aerobic   Nustep  L5 x 13 mins for ROM      Modalities   Modalities  Electrical Stimulation;Vasopneumatic      Electrical Stimulation   Electrical Stimulation Location  Left medial ankle  premod x15 mins 1-10hz     Electrical Stimulation Goals  Edema;Pain      Vasopneumatic   Number Minutes Vasopneumatic   15 minutes    Vasopnuematic Location   -- Left ankle.    Vasopneumatic Pressure  Low    Vasopneumatic Temperature   36      Manual Therapy   Manual Therapy  Soft tissue mobilization    Soft tissue mobilization  STW/ scar mobs to LT ankle medial incision               PT Short Term Goals - 11/11/17 1224      PT SHORT TERM GOAL #1   Title  STG's=LTG's.        PT Long Term Goals - 11/11/17 1224      PT LONG TERM GOAL #1   Title  Independent with a HEP.    Time  12  Period  Weeks    Status  New      PT LONG TERM GOAL #2   Title  Increase left ankle dorsiflexion to 6- 8 degrees to normalize the patient's gait pattern.    Time  12    Period  Weeks    Status  New      PT LONG TERM GOAL #3   Title  Increase left ankle strength to 4+ to 5/5 to increase stability for functional tasks.    Time  12    Period  Weeks    Status  New      PT LONG TERM GOAL #4   Title  Walk without deviation.    Time  12    Period  Weeks    Status  New      PT LONG TERM GOAL #5   Title  Perform ADL's with pain not > 3/10.    Time  12    Period  Weeks    Status  New            Plan - 11/13/17 1401    Clinical Presentation  Stable    Clinical Decision Making  Low    Rehab Potential  Excellent    PT Frequency  2x / week    PT Duration  12 weeks    PT Treatment/Interventions  ADLs/Self Care Home Management;Cryotherapy;Electrical Stimulation;Ultrasound;Moist Heat;Gait training;Stair training;Therapeutic activities;Therapeutic exercise;Neuromuscular re-education;Patient/family education;Passive range of motion;Manual techniques;Vasopneumatic Device    PT Next Visit Plan   Begin with seated Rockerboard and progress to standing; Nustep; PROM to left ankle; Theraband exercises; sheet stretch to increase dorsiflexion; progress to standing activites to include proprioceptive exercises.  Vasopneumatic and electrical stimulation.       Patient will benefit from skilled therapeutic intervention in order to improve the following deficits and impairments:  Abnormal gait, Decreased activity tolerance, Decreased range of motion, Increased edema, Pain  Visit Diagnosis: Pain in left ankle and joints of left foot  Stiffness of left ankle, not elsewhere classified  Localized edema     Problem List Patient Active Problem List   Diagnosis Date Noted  . Acute ST elevation myocardial infarction (STEMI) involving left anterior descending coronary artery (HCC) 01/09/2015  . ST elevation myocardial infarction (STEMI) involving other coronary artery of inferior wall (HCC) 01/09/2015  . STEMI (ST elevation myocardial infarction) (HCC) 01/09/2015  . Hypertension   . Non-insulin dependent type 2 diabetes mellitus (HCC)   . Essential hypertension   . Coronary artery disease involving native coronary artery of native heart with unstable angina pectoris [facility](HCC)     Emira Eubanks,CHRIS, PTA 11/13/2017, 3:46 PM  [facility] [address] [place], [place], [address] Phone: [phone]   Fax:  [phone]  Name: Sherril CroonFairley Herandez MRN: [id number] Date of Birth: 1945-08-18

## 2017-11-18 ENCOUNTER — Ambulatory Visit: Payer: No Typology Code available for payment source | Admitting: *Deleted

## 2017-11-18 DIAGNOSIS — M25572 Pain in left ankle and joints of left foot: Secondary | ICD-10-CM

## 2017-11-18 DIAGNOSIS — M25672 Stiffness of left ankle, not elsewhere classified: Secondary | ICD-10-CM

## 2017-11-18 DIAGNOSIS — R600 Localized edema: Secondary | ICD-10-CM

## 2017-11-18 NOTE — Therapy (Signed)
[facility] [address] [place], [place], [address] Phone: [phone]   Fax:  [phone]  Physical Therapy Treatment  Patient Details  Name: Ellard Nan MRN: [id number] Date of Birth: [birth year] Referring Provider: Fritzi Mandes MD   Encounter Date: 11/18/2017  PT End of Session - 11/18/17 1059    Visit Number  3    Number of Visits  24    Date for PT Re-Evaluation  02/03/18    PT Start Time  1030    PT Stop Time  1128    PT Time Calculation (min)  58 min       Past Medical History:  Diagnosis Date  . CAD (coronary artery disease) 01/09/15   a. cath 01/09/15 PCI with 2 DES stents in LAD/1 in Dx with comcomitant CAD in distal LAD, RI.   . Diabetes mellitus without complication (HCC)   . Hypertension   . STEMI (ST elevation myocardial infarction) (HCC) 01/09/15    Past Surgical History:  Procedure Laterality Date  . CARDIAC CATHETERIZATION N/A 01/09/2015   Procedure: Left Heart Cath and Coronary Angiography;  Surgeon: Marykay Lex, MD;  Location: [facility] INVASIVE CV LAB;  Service: Cardiovascular;  Laterality: N/A;  . CARDIAC CATHETERIZATION  01/09/2015   Procedure: Coronary Stent Intervention;  Surgeon: Marykay Lex, MD;  Location: [facility] INVASIVE CV LAB;  Service: Cardiovascular;;  . COLONOSCOPY    . ESOPHAGOGASTRODUODENOSCOPY      There were no vitals filed for this visit.  Subjective Assessment - 11/18/17 1057    Subjective  Did ok after last Rx. 3-4/10 pain today    Pertinent History  DM    How long can you walk comfortably?  Short community distances.    Patient Stated Goals  Get back to normal.    Currently in Pain?  Yes    Pain Score  4     Pain Location  Ankle    Pain Orientation  Left    Pain Descriptors / Indicators  Sharp;Shooting    Pain Type  Acute pain    Pain Onset  More than a month ago    Pain Frequency  Constant                       [facility] Adult PT Treatment/Exercise - 11/18/17 0001       Exercises   Exercises  Ankle;Knee/Hip      Knee/Hip Exercises: Aerobic   Nustep  L5 x 15 mins for ROM      Modalities   Modalities  Electrical Stimulation;Vasopneumatic      Electrical Stimulation   Electrical Stimulation Location  Left medial ankle  premod x15 mins 1-10hz     Electrical Stimulation Goals  Edema;Pain      Vasopneumatic   Number Minutes Vasopneumatic   15 minutes    Vasopnuematic Location   -- Left ankle.    Vasopneumatic Pressure  Low    Vasopneumatic Temperature   36      Manual Therapy   Manual Therapy  Soft tissue mobilization    Soft tissue mobilization  STW/ scar mobs to LT ankle medial incision      Ankle Exercises: Seated   Other Seated Ankle Exercises  Rocker board inv/ev x , PF/DF x 3 mins, dynadisc all motions x 3 mins, Green tband PF x10 and DF stretching 3 x 30 secs.                PT Short  Term Goals - 11/11/17 1224      PT SHORT TERM GOAL #1   Title  STG's=LTG's.        PT Long Term Goals - 11/11/17 1224      PT LONG TERM GOAL #1   Title  Independent with a HEP.    Time  12    Period  Weeks    Status  New      PT LONG TERM GOAL #2   Title  Increase left ankle dorsiflexion to 6- 8 degrees to normalize the patient's gait pattern.    Time  12    Period  Weeks    Status  New      PT LONG TERM GOAL #3   Title  Increase left ankle strength to 4+ to 5/5 to increase stability for functional tasks.    Time  12    Period  Weeks    Status  New      PT LONG TERM GOAL #4   Title  Walk without deviation.    Time  12    Period  Weeks    Status  New      PT LONG TERM GOAL #5   Title  Perform ADL's with pain not > 3/10.    Time  12    Period  Weeks    Status  New            Plan - 11/18/17 1104    Clinical Impression Statement  Pt arrived to clinic today doing a little better with less pain and increased ROM for LT ankle. He also reports decreased sensitivity around incision by using a dry soft cloth at home.  He was able to perform sittting proprioception therex with minimal discomfort. He was issued green tband for PF strengthening and DF stretching. PROM DF 4 degrees with knee bent He still has an antalgic gait pattern with no AD. Normal modality response.    Clinical Presentation  Stable    Clinical Decision Making  Low    Rehab Potential  Excellent    PT Frequency  2x / week    PT Duration  12 weeks    PT Treatment/Interventions  ADLs/Self Care Home Management;Cryotherapy;Electrical Stimulation;Ultrasound;Moist Heat;Gait training;Stair training;Therapeutic activities;Therapeutic exercise;Neuromuscular re-education;Patient/family education;Passive range of motion;Manual techniques;Vasopneumatic Device    PT Next Visit Plan  Begin with seated Rockerboard and progress to standing; Nustep; PROM to left ankle; Theraband exercises; sheet stretch to increase dorsiflexion; progress to standing activites to include proprioceptive exercises.  Vasopneumatic and electrical stimulation.    Consulted and Agree with Plan of Care  Patient       Patient will benefit from skilled therapeutic intervention in order to improve the following deficits and impairments:  Abnormal gait, Decreased activity tolerance, Decreased range of motion, Increased edema, Pain  Visit Diagnosis: Pain in left ankle and joints of left foot  Stiffness of left ankle, not elsewhere classified  Localized edema     Problem List Patient Active Problem List   Diagnosis Date Noted  . Acute ST elevation myocardial infarction (STEMI) involving left anterior descending coronary artery (HCC) 01/09/2015  . ST elevation myocardial infarction (STEMI) involving other coronary artery of inferior wall (HCC) 01/09/2015  . STEMI (ST elevation myocardial infarction) (HCC) 01/09/2015  . Hypertension   . Non-insulin dependent type 2 diabetes mellitus (HCC)   . Essential hypertension   . Coronary artery disease involving native coronary artery of  native heart with unstable angina pectoris (HCC)  Daren Yeagle,CHRIS, PTA 11/18/2017, 11:50 AM  [facility] [address] [place], [place], [address] Phone: [phone]   Fax:  [phone]  Name: Miner Koral MRN: [id number] Date of Birth: 01-10-1946

## 2017-11-20 ENCOUNTER — Encounter: Payer: Self-pay | Admitting: Physical Therapy

## 2017-11-20 ENCOUNTER — Ambulatory Visit: Payer: No Typology Code available for payment source | Admitting: Physical Therapy

## 2017-11-20 DIAGNOSIS — M25572 Pain in left ankle and joints of left foot: Secondary | ICD-10-CM | POA: Diagnosis not present

## 2017-11-20 NOTE — Therapy (Signed)
[facility] [address] [place], [place], [address] Phone: [phone]   Fax:  [phone]  Physical Therapy Treatment  Patient Details  Name: Gary CroonFairley Galas MRN: [id number] Date of Birth: February 26, 1946 Referring Provider: Fritzi MandesJustine Crowley MD   Encounter Date: 11/20/2017  PT End of Session - 11/20/17 1627    Visit Number  4    Number of Visits  24    Date for PT Re-Evaluation  02/03/18    PT Start Time  0101    PT Stop Time  0153    PT Time Calculation (min)  52 min    Activity Tolerance  Patient tolerated treatment well    Behavior During Therapy  [facility] for tasks assessed/performed       Past Medical History:  Diagnosis Date  . CAD (coronary artery disease) 01/09/15   a. cath 01/09/15 PCI with 2 DES stents in LAD/1 in Dx with comcomitant CAD in distal LAD, RI.   . Diabetes mellitus without complication (HCC)   . Hypertension   . STEMI (ST elevation myocardial infarction) (HCC) 01/09/15    Past Surgical History:  Procedure Laterality Date  . CARDIAC CATHETERIZATION N/A 01/09/2015   Procedure: Left Heart Cath and Coronary Angiography;  Surgeon: Marykay Lexavid W Harding, MD;  [facility];  Service: Cardiovascular;  Laterality: N/A;  . CARDIAC CATHETERIZATION  01/09/2015   Procedure: Coronary Stent Intervention;  Surgeon: Marykay Lexavid W Harding, MD;  [facility];  Service: Cardiovascular;;  . COLONOSCOPY    . ESOPHAGOGASTRODUODENOSCOPY      There were no vitals filed for this visit.  Subjective Assessment - 11/20/17 1624    Subjective  I tillled my garden.    Pain Score  4     Pain Location  Ankle    Pain Orientation  Left    Pain Descriptors / Indicators  Sharp;Shooting    Pain Onset  More than a month ago         [facility] - 11/20/17 0001      AROM   Overall AROM Comments  Inversion 12 degrees and eversion 3 degrees.                   [facility] Adult PT Treatment/Exercise - 11/20/17  0001      Exercises   Exercises  Knee/Hip      Knee/Hip Exercises: Aerobic   Nustep  Level 5 x 15 minutes.      Programme researcher, broadcasting/film/videolectrical Stimulation   Electrical Stimulation Location  Left ankle.    Electrical Stimulation Action  IFC    Electrical Stimulation Parameters  80-150 Hz x 15 minutes.    Electrical Stimulation Goals  Edema;Pain      Vasopneumatic   Number Minutes Vasopneumatic   15 minutes    Vasopnuematic Location   -- Left ankle.    Vasopneumatic Pressure  Medium      Manual Therapy   Manual Therapy  Passive ROM    Passive ROM  PROM x 8 minutes to patient's left ankle.               PT Short Term Goals - 11/11/17 1224      PT SHORT TERM GOAL #1   Title  STG's=LTG's.        PT Long Term Goals - 11/11/17 1224      PT LONG TERM GOAL #1   Title  Independent with a HEP.    Time  12  Period  Weeks    Status  New      PT LONG TERM GOAL #2   Title  Increase left ankle dorsiflexion to 6- 8 degrees to normalize the patient's gait pattern.    Time  12    Period  Weeks    Status  New      PT LONG TERM GOAL #3   Title  Increase left ankle strength to 4+ to 5/5 to increase stability for functional tasks.    Time  12    Period  Weeks    Status  New      PT LONG TERM GOAL #4   Title  Walk without deviation.    Time  12    Period  Weeks    Status  New      PT LONG TERM GOAL #5   Title  Perform ADL's with pain not > 3/10.    Time  12    Period  Weeks    Status  New            Plan - 11/20/17 1630    Clinical Impression Statement  Great job today with a notable improvement in ROM.    PT Treatment/Interventions  ADLs/Self Care Home Management;Cryotherapy;Electrical Stimulation;Ultrasound;Moist Heat;Gait training;Stair training;Therapeutic activities;Therapeutic exercise;Neuromuscular re-education;Patient/family education;Passive range of motion;Manual techniques;Vasopneumatic Device    PT Next Visit Plan  Begin with seated Rockerboard and progress to  standing; Nustep; PROM to left ankle; Theraband exercises; sheet stretch to increase dorsiflexion; progress to standing activites to include proprioceptive exercises.  Vasopneumatic and electrical stimulation.    Consulted and Agree with Plan of Care  Patient       Patient will benefit from skilled therapeutic intervention in order to improve the following deficits and impairments:     Visit Diagnosis: Pain in left ankle and joints of left foot     Problem List Patient Active Problem List   Diagnosis Date Noted  . Acute ST elevation myocardial infarction (STEMI) involving left anterior descending coronary artery (HCC) 01/09/2015  . ST elevation myocardial infarction (STEMI) involving other coronary artery of inferior wall (HCC) 01/09/2015  . STEMI (ST elevation myocardial infarction) (HCC) 01/09/2015  . Hypertension   . Non-insulin dependent type 2 diabetes mellitus (HCC)   . Essential hypertension   . Coronary artery disease involving native coronary artery of native heart with unstable angina pectoris (HCC)     Shawnique Mariotti, [place] MPT 11/20/2017, 4:43 PM  [facility] [address] [place], [place], [address] Phone: [phone]   Fax:  [phone]  Name: Berthold Glace MRN: [id number] Date of Birth: 1946/01/21

## 2017-11-25 ENCOUNTER — Ambulatory Visit: Payer: No Typology Code available for payment source | Admitting: Physical Therapy

## 2017-11-25 DIAGNOSIS — M25672 Stiffness of left ankle, not elsewhere classified: Secondary | ICD-10-CM

## 2017-11-25 DIAGNOSIS — R600 Localized edema: Secondary | ICD-10-CM

## 2017-11-25 DIAGNOSIS — M25572 Pain in left ankle and joints of left foot: Secondary | ICD-10-CM

## 2017-11-25 NOTE — Therapy (Signed)
[facility] [address] [place], [place], [address] Phone: [phone]   Fax:  [phone]  Physical Therapy Treatment  Patient Details  Name: Gary Wheeler MRN: [id number] Date of Birth: 08/17/1945 Referring Provider: Fritzi Mandes MD   Encounter Date: 11/25/2017  PT End of Session - 11/25/17 1359    Visit Number  5    Number of Visits  24    Date for PT Re-Evaluation  02/03/18    PT Start Time  0100    PT Stop Time  0159    PT Time Calculation (min)  59 min       Past Medical History:  Diagnosis Date  . CAD (coronary artery disease) 01/09/15   a. cath 01/09/15 PCI with 2 DES stents in LAD/1 in Dx with comcomitant CAD in distal LAD, RI.   . Diabetes mellitus without complication (HCC)   . Hypertension   . STEMI (ST elevation myocardial infarction) (HCC) 01/09/15    Past Surgical History:  Procedure Laterality Date  . CARDIAC CATHETERIZATION N/A 01/09/2015   Procedure: Left Heart Cath and Coronary Angiography;  Surgeon: Marykay Lex, MD;  Location: [facility] INVASIVE CV LAB;  Service: Cardiovascular;  Laterality: N/A;  . CARDIAC CATHETERIZATION  01/09/2015   Procedure: Coronary Stent Intervention;  Surgeon: Marykay Lex, MD;  Location: [facility] INVASIVE CV LAB;  Service: Cardiovascular;;  . COLONOSCOPY    . ESOPHAGOGASTRODUODENOSCOPY      There were no vitals filed for this visit.  Subjective Assessment - 11/25/17 1315    Subjective  I was on my feet a lot and my ankle hurt quite a bit.    Pain Score  5     Pain Location  Ankle    Pain Orientation  Left    Pain Onset  More than a month ago             Exercises   Exercises  Knee/Hip        Knee/Hip Exercises: Aerobic   Nustep  Level 4 x 15 minutes.        Programme researcher, broadcasting/film/video Location  Left ankle.    Electrical Stimulation Action  IFC    Electrical Stimulation Parameters  1-10 Hz x 20 minutes.    Electrical Stimulation Goals   Edema;Pain        Vasopneumatic   Number Minutes Vasopneumatic   20 minutes    Vasopnuematic Location   -- Left ankle.    Vasopneumatic Pressure  Medium        Manual Therapy   Passive ROM  PROM x 4 minutes to patient's left ankle all motions.        Ankle Exercises: Standing   Other Standing Ankle Exercises  Rockerboard in parallel bars x 4 minutes dynadisc x 4 minutes partial WB.                                PT Short Term Goals - 11/11/17 1224      PT SHORT TERM GOAL #1   Title  STG's=LTG's.        PT Long Term Goals - 11/11/17 1224      PT LONG TERM GOAL #1   Title  Independent with a HEP.    Time  12    Period  Weeks    Status  New      PT LONG TERM GOAL #2  Title  Increase left ankle dorsiflexion to 6- 8 degrees to normalize the patient's gait pattern.    Time  12    Period  Weeks    Status  New      PT LONG TERM GOAL #3   Title  Increase left ankle strength to 4+ to 5/5 to increase stability for functional tasks.    Time  12    Period  Weeks    Status  New      PT LONG TERM GOAL #4   Title  Walk without deviation.    Time  12    Period  Weeks    Status  New      PT LONG TERM GOAL #5   Title  Perform ADL's with pain not > 3/10.    Time  12    Period  Weeks    Status  New            Plan - 11/25/17 1343    Clinical Impression Statement  Patient did great today with standing activites with partial weight bearing during activites.    PT Treatment/Interventions  ADLs/Self Care Home Management;Cryotherapy;Electrical Stimulation;Ultrasound;Moist Heat;Gait training;Stair training;Therapeutic activities;Therapeutic exercise;Neuromuscular re-education;Patient/family education;Passive range of motion;Manual techniques;Vasopneumatic Device    PT Next Visit Plan  Begin with seated Rockerboard and progress to standing; Nustep; PROM to left ankle; Theraband exercises; sheet stretch to increase dorsiflexion; progress to  standing activites to include proprioceptive exercises.  Vasopneumatic and electrical stimulation.    Consulted and Agree with Plan of Care  Patient       Patient will benefit from skilled therapeutic intervention in order to improve the following deficits and impairments:  Abnormal gait, Decreased activity tolerance, Decreased range of motion, Increased edema, Pain  Visit Diagnosis: Pain in left ankle and joints of left foot  Stiffness of left ankle, not elsewhere classified  Localized edema     Problem List Patient Active Problem List   Diagnosis Date Noted  . Acute ST elevation myocardial infarction (STEMI) involving left anterior descending coronary artery (HCC) 01/09/2015  . ST elevation myocardial infarction (STEMI) involving other coronary artery of inferior wall (HCC) 01/09/2015  . STEMI (ST elevation myocardial infarction) (HCC) 01/09/2015  . Hypertension   . Non-insulin dependent type 2 diabetes mellitus (HCC)   . Essential hypertension   . Coronary artery disease involving native coronary artery of native heart with unstable angina pectoris (HCC)     [place], [place] MPT 11/26/2017, 8:47 AM  [facility] [address] [place], [place], [address] Phone: [phone]   Fax:  [phone]  Name: Gary Wheeler MRN: [id number] Date of Birth: 02-15-1946

## 2017-11-27 ENCOUNTER — Ambulatory Visit: Payer: No Typology Code available for payment source | Attending: Orthopedic Surgery | Admitting: Physical Therapy

## 2017-11-27 ENCOUNTER — Encounter: Payer: Self-pay | Admitting: Physical Therapy

## 2017-11-27 DIAGNOSIS — M25672 Stiffness of left ankle, not elsewhere classified: Secondary | ICD-10-CM | POA: Diagnosis present

## 2017-11-27 DIAGNOSIS — R600 Localized edema: Secondary | ICD-10-CM

## 2017-11-27 DIAGNOSIS — M25572 Pain in left ankle and joints of left foot: Secondary | ICD-10-CM | POA: Diagnosis present

## 2017-11-27 NOTE — Therapy (Signed)
[facility] [address] [place], [place], [address] Phone: [phone]   Fax:  [phone]  Physical Therapy Treatment  Patient Details  Name: Gary Wheeler MRN: [id number] Date of Birth: Mar 11, 1946 Referring Provider: Fritzi Mandes MD   Encounter Date: 11/27/2017  PT End of Session - 11/27/17 1343    Visit Number  6    Number of Visits  24    Date for PT Re-Evaluation  02/03/18    PT Start Time  0106    PT Stop Time  0200    PT Time Calculation (min)  54 min    Activity Tolerance  Patient tolerated treatment well    Behavior During Therapy  [facility] for tasks assessed/performed       Past Medical History:  Diagnosis Date  . CAD (coronary artery disease) 01/09/15   a. cath 01/09/15 PCI with 2 DES stents in LAD/1 in Dx with comcomitant CAD in distal LAD, RI.   . Diabetes mellitus without complication (HCC)   . Hypertension   . STEMI (ST elevation myocardial infarction) (HCC) 01/09/15    Past Surgical History:  Procedure Laterality Date  . CARDIAC CATHETERIZATION N/A 01/09/2015   Procedure: Left Heart Cath and Coronary Angiography;  Surgeon: Marykay Lex, MD;  Location: [facility] INVASIVE CV LAB;  Service: Cardiovascular;  Laterality: N/A;  . CARDIAC CATHETERIZATION  01/09/2015   Procedure: Coronary Stent Intervention;  Surgeon: Marykay Lex, MD;  Location: [facility] INVASIVE CV LAB;  Service: Cardiovascular;;  . COLONOSCOPY    . ESOPHAGOGASTRODUODENOSCOPY      There were no vitals filed for this visit.  Subjective Assessment - 11/27/17 1343    Subjective  No new complaints.    Pain Score  5     Pain Location  Ankle    Pain Orientation  Left    Pain Descriptors / Indicators  Sharp;Shooting    Pain Type  Acute pain    Pain Onset  More than a month ago                       [facility] Adult PT Treatment/Exercise - 11/27/17 0001      Exercises   Exercises  Knee/Hip      Knee/Hip Exercises: Aerobic   Nustep  Level 5 x 15  minutes.      Programme researcher, broadcasting/film/video Location  left ankle.    Electrical Stimulation Action  IFC x 20 minutes.    Electrical Stimulation Goals  Edema;Pain      Vasopneumatic   Number Minutes Vasopneumatic   20 minutes    Vasopnuematic Location   -- Left ankle.    Vasopneumatic Pressure  Medium      Ankle Exercises: Standing   Other Standing Ankle Exercises  Rockerboard x 5 minutes f/b partial weight dynadisc x 3 minutes.               PT Short Term Goals - 11/11/17 1224      PT SHORT TERM GOAL #1   Title  STG's=LTG's.        PT Long Term Goals - 11/11/17 1224      PT LONG TERM GOAL #1   Title  Independent with a HEP.    Time  12    Period  Weeks    Status  New      PT LONG TERM GOAL #2   Title  Increase left ankle dorsiflexion to 6- 8 degrees  to normalize the patient's gait pattern.    Time  12    Period  Weeks    Status  New      PT LONG TERM GOAL #3   Title  Increase left ankle strength to 4+ to 5/5 to increase stability for functional tasks.    Time  12    Period  Weeks    Status  New      PT LONG TERM GOAL #4   Title  Walk without deviation.    Time  12    Period  Weeks    Status  New      PT LONG TERM GOAL #5   Title  Perform ADL's with pain not > 3/10.    Time  12    Period  Weeks    Status  New            Plan - 11/27/17 1345    Clinical Impression Statement  Patient doing very well with progression toward goals.  His left ankle range of motion has improved significantly.    Rehab Potential  Excellent    PT Frequency  2x / week    PT Duration  12 weeks    PT Treatment/Interventions  ADLs/Self Care Home Management;Cryotherapy;Electrical Stimulation;Ultrasound;Moist Heat;Gait training;Stair training;Therapeutic activities;Therapeutic exercise;Neuromuscular re-education;Patient/family education;Passive range of motion;Manual techniques;Vasopneumatic Device    PT Next Visit Plan  Begin with seated Rockerboard  and progress to standing; Nustep; PROM to left ankle; Theraband exercises; sheet stretch to increase dorsiflexion; progress to standing activites to include proprioceptive exercises.  Vasopneumatic and electrical stimulation.    Consulted and Agree with Plan of Care  Patient       Patient will benefit from skilled therapeutic intervention in order to improve the following deficits and impairments:  Abnormal gait, Decreased activity tolerance, Decreased range of motion, Increased edema, Pain  Visit Diagnosis: Pain in left ankle and joints of left foot  Stiffness of left ankle, not elsewhere classified  Localized edema     Problem List Patient Active Problem List   Diagnosis Date Noted  . Acute ST elevation myocardial infarction (STEMI) involving left anterior descending coronary artery (HCC) 01/09/2015  . ST elevation myocardial infarction (STEMI) involving other coronary artery of inferior wall (HCC) 01/09/2015  . STEMI (ST elevation myocardial infarction) (HCC) 01/09/2015  . Hypertension   . Non-insulin dependent type 2 diabetes mellitus (HCC)   . Essential hypertension   . Coronary artery disease involving native coronary artery of native heart with unstable angina pectoris (HCC)     Rekita Miotke, [place] MPT 11/27/2017, 2:34 PM  [facility] [address] [place], [place], [address] Phone: [phone]   Fax:  [phone]  Name: Gary Wheeler MRN: [id number] Date of Birth: 1945-11-03

## 2017-12-02 ENCOUNTER — Ambulatory Visit: Payer: No Typology Code available for payment source | Admitting: Physical Therapy

## 2017-12-02 ENCOUNTER — Encounter: Payer: Self-pay | Admitting: Physical Therapy

## 2017-12-02 DIAGNOSIS — M25672 Stiffness of left ankle, not elsewhere classified: Secondary | ICD-10-CM

## 2017-12-02 DIAGNOSIS — M25572 Pain in left ankle and joints of left foot: Secondary | ICD-10-CM | POA: Diagnosis not present

## 2017-12-02 DIAGNOSIS — R600 Localized edema: Secondary | ICD-10-CM

## 2017-12-02 NOTE — Therapy (Signed)
[facility] [address] [place], [place], [address] Phone: [phone]   Fax:  [phone]  Physical Therapy Treatment  Patient Details  Name: Gary Wheeler MRN: [id number] Date of Birth: [birth year] Referring Provider: Fritzi Mandes MD   Encounter Date: 12/02/2017  PT End of Session - 12/02/17 1436    Visit Number  7    Number of Visits  24    Date for PT Re-Evaluation  02/03/18    PT Start Time  0145    PT Stop Time  0244    PT Time Calculation (min)  59 min    Activity Tolerance  Patient tolerated treatment well    Behavior During Therapy  [facility] for tasks assessed/performed       Past Medical History:  Diagnosis Date  . CAD (coronary artery disease) 01/09/15   a. cath 01/09/15 PCI with 2 DES stents in LAD/1 in Dx with comcomitant CAD in distal LAD, RI.   . Diabetes mellitus without complication (HCC)   . Hypertension   . STEMI (ST elevation myocardial infarction) (HCC) 01/09/15    Past Surgical History:  Procedure Laterality Date  . CARDIAC CATHETERIZATION N/A 01/09/2015   Procedure: Left Heart Cath and Coronary Angiography;  Surgeon: Marykay Lex, MD;  Location: [facility] INVASIVE CV LAB;  Service: Cardiovascular;  Laterality: N/A;  . CARDIAC CATHETERIZATION  01/09/2015   Procedure: Coronary Stent Intervention;  Surgeon: Marykay Lex, MD;  Location: [facility] INVASIVE CV LAB;  Service: Cardiovascular;;  . COLONOSCOPY    . ESOPHAGOGASTRODUODENOSCOPY      There were no vitals filed for this visit.  Subjective Assessment - 12/02/17 1410    Subjective  Doing pretty.  Heel has been hurting.  The electrical stimulation has helped.  Foot numbness not too too bad.    Pain Score  4     Pain Location  Ankle    Pain Orientation  Left    Pain Descriptors / Indicators  Sharp;Shooting    Pain Onset  More than a month ago                       [facility] Adult PT Treatment/Exercise - 12/02/17 0001      Exercises   Exercises   Knee/Hip;Ankle      Knee/Hip Exercises: Aerobic   Nustep  Level x 16 minutes.      Knee/Hip Exercises: Standing   Other Standing Knee Exercises  Rockerboard x 5 minutes f/b PWB dyandisc in all directions x 3 minutes.      Programme researcher, broadcasting/film/video Location  Left ankle.    Electrical Stimulation Action  IFC at 1-10 Hz x 20 minutes.    Electrical Stimulation Goals  Edema;Pain      Vasopneumatic   Number Minutes Vasopneumatic   20 minutes    Vasopnuematic Location   -- Left knee.    Vasopneumatic Pressure  Medium      Manual Therapy   Manual Therapy  Passive ROM    Passive ROM  PROM x 5 minutes in all direction to patient's left ankle.               PT Short Term Goals - 11/11/17 1224      PT SHORT TERM GOAL #1   Title  STG's=LTG's.        PT Long Term Goals - 12/02/17 1448      PT LONG TERM GOAL #1   Title  Independent with a HEP.    Time  12    Period  Weeks    Status  On-going      PT LONG TERM GOAL #2   Title  Increase left ankle dorsiflexion to 6- 8 degrees to normalize the patient's gait pattern.    Time  12    Period  Weeks    Status  On-going      PT LONG TERM GOAL #3   Title  Increase left ankle strength to 4+ to 5/5 to increase stability for functional tasks.    Time  12    Period  Weeks    Status  On-going      PT LONG TERM GOAL #4   Title  Walk without deviation.    Time  12    Period  Weeks    Status  On-going      PT LONG TERM GOAL #5   Title  Perform ADL's with pain not > 3/10.    Time  12    Period  Weeks    Status  On-going            Plan - 12/02/17 1424    Clinical Impression Statement  Patient is making excellent progress.  His swelling is down by 1/2 cm.  He states the electrical stimulation is very helpful.  His plantarflexion is now 45 degrees.  His left ankle eversion= 6 degrees and inversion is 20 degrees.    Clinical Presentation  Stable    Clinical Decision Making  Low    PT  Treatment/Interventions  ADLs/Self Care Home Management;Cryotherapy;Electrical Stimulation;Ultrasound;Moist Heat;Gait training;Stair training;Therapeutic activities;Therapeutic exercise;Neuromuscular re-education;Patient/family education;Passive range of motion;Manual techniques;Vasopneumatic Device    PT Next Visit Plan  Begin with seated Rockerboard and progress to standing; Nustep; PROM to left ankle; Theraband exercises; sheet stretch to increase dorsiflexion; progress to standing activites to include proprioceptive exercises.  Vasopneumatic and electrical stimulation.    Consulted and Agree with Plan of Care  Patient       Patient will benefit from skilled therapeutic intervention in order to improve the following deficits and impairments:  Abnormal gait, Decreased activity tolerance, Decreased range of motion, Increased edema, Pain  Visit Diagnosis: Pain in left ankle and joints of left foot  Stiffness of left ankle, not elsewhere classified  Localized edema     Problem List Patient Active Problem List   Diagnosis Date Noted  . Acute ST elevation myocardial infarction (STEMI) involving left anterior descending coronary artery (HCC) 01/09/2015  . ST elevation myocardial infarction (STEMI) involving other coronary artery of inferior wall (HCC) 01/09/2015  . STEMI (ST elevation myocardial infarction) (HCC) 01/09/2015  . Hypertension   . Non-insulin dependent type 2 diabetes mellitus (HCC)   . Essential hypertension   . Coronary artery disease involving native coronary artery of native heart with unstable angina pectoris (HCC)     Nik Gorrell, [place] MPT 12/02/2017, 2:49 PM  [facility] [address] [place], [place], [address] Phone: [phone]   Fax:  [phone]  Name: Gary Wheeler MRN: [id number] Date of Birth: 26-Jul-1946

## 2017-12-04 ENCOUNTER — Ambulatory Visit: Payer: No Typology Code available for payment source | Admitting: Physical Therapy

## 2017-12-04 ENCOUNTER — Encounter: Payer: Self-pay | Admitting: Physical Therapy

## 2017-12-04 DIAGNOSIS — M25672 Stiffness of left ankle, not elsewhere classified: Secondary | ICD-10-CM

## 2017-12-04 DIAGNOSIS — M25572 Pain in left ankle and joints of left foot: Secondary | ICD-10-CM | POA: Diagnosis not present

## 2017-12-04 NOTE — Therapy (Signed)
[facility] [address] [place], [place], [address] Phone: [phone]   Fax:  [phone]  Physical Therapy Treatment  Patient Details  Name: Gary Wheeler MRN: [id number] Date of Birth: October 06, 1945 Referring Provider: Fritzi Mandes MD   Encounter Date: 12/04/2017  PT End of Session - 12/04/17 1504    Visit Number  8    Number of Visits  24    Date for PT Re-Evaluation  02/03/18    PT Start Time  0145    PT Stop Time  0235    PT Time Calculation (min)  50 min    Activity Tolerance  Patient tolerated treatment well    Behavior During Therapy  [facility] for tasks assessed/performed       Past Medical History:  Diagnosis Date  . CAD (coronary artery disease) 01/09/15   a. cath 01/09/15 PCI with 2 DES stents in LAD/1 in Dx with comcomitant CAD in distal LAD, RI.   . Diabetes mellitus without complication (HCC)   . Hypertension   . STEMI (ST elevation myocardial infarction) (HCC) 01/09/15    Past Surgical History:  Procedure Laterality Date  . CARDIAC CATHETERIZATION N/A 01/09/2015   Procedure: Left Heart Cath and Coronary Angiography;  Surgeon: Marykay Lex, MD;  Location: [facility] INVASIVE CV LAB;  Service: Cardiovascular;  Laterality: N/A;  . CARDIAC CATHETERIZATION  01/09/2015   Procedure: Coronary Stent Intervention;  Surgeon: Marykay Lex, MD;  Location: [facility] INVASIVE CV LAB;  Service: Cardiovascular;;  . COLONOSCOPY    . ESOPHAGOGASTRODUODENOSCOPY      There were no vitals filed for this visit.  Subjective Assessment - 12/04/17 1458    Subjective  Getting better.    Currently in Pain?  Yes    Pain Score  4     Pain Location  Ankle    Pain Orientation  Left    Pain Descriptors / Indicators  Sharp;Shooting    Pain Type  Acute pain    Pain Onset  More than a month ago                       [facility] Adult PT Treatment/Exercise - 12/04/17 0001      Exercises   Exercises  Knee/Hip;Ankle      Knee/Hip Exercises:  Aerobic   Nustep  Level 5 x 15 minutes.      Programme researcher, broadcasting/film/video Location  Left ankle.    Electrical Stimulation Action  IFC at 1-10 Hz x .    Electrical Stimulation Goals  Edema;Pain      Vasopneumatic   Number Minutes Vasopneumatic   15 minutes    Vasopnuematic Location   -- Left ankle.    Vasopneumatic Pressure  Medium      Ankle Exercises: Standing   Other Standing Ankle Exercises  Rockerboard x 5 minutes and dynadisc x 3 minutes.               PT Short Term Goals - 11/11/17 1224      PT SHORT TERM GOAL #1   Title  STG's=LTG's.        PT Long Term Goals - 12/02/17 1448      PT LONG TERM GOAL #1   Title  Independent with a HEP.    Time  12    Period  Weeks    Status  On-going      PT LONG TERM GOAL #2   Title  Increase  left ankle dorsiflexion to 6- 8 degrees to normalize the patient's gait pattern.    Time  12    Period  Weeks    Status  On-going      PT LONG TERM GOAL #3   Title  Increase left ankle strength to 4+ to 5/5 to increase stability for functional tasks.    Time  12    Period  Weeks    Status  On-going      PT LONG TERM GOAL #4   Title  Walk without deviation.    Time  12    Period  Weeks    Status  On-going      PT LONG TERM GOAL #5   Title  Perform ADL's with pain not > 3/10.    Time  12    Period  Weeks    Status  On-going            Plan - 12/04/17 1505    Clinical Impression Statement  Very good progression toward goals.      PT Next Visit Plan  STW/M..IASTM to left calf.    Consulted and Agree with Plan of Care  Patient       Patient will benefit from skilled therapeutic intervention in order to improve the following deficits and impairments:     Visit Diagnosis: Pain in left ankle and joints of left foot  Stiffness of left ankle, not elsewhere classified     Problem List Patient Active Problem List   Diagnosis Date Noted  . Acute ST elevation myocardial infarction  (STEMI) involving left anterior descending coronary artery (HCC) 01/09/2015  . ST elevation myocardial infarction (STEMI) involving other coronary artery of inferior wall (HCC) 01/09/2015  . STEMI (ST elevation myocardial infarction) (HCC) 01/09/2015  . Hypertension   . Non-insulin dependent type 2 diabetes mellitus (HCC)   . Essential hypertension   . Coronary artery disease involving native coronary artery of native heart with unstable angina pectoris (HCC)     Rayyan Burley, [place] MPT 12/04/2017, 3:06 PM  [facility] [address] [place], [place], [address] Phone: [phone]   Fax:  [phone]  Name: Gary Wheeler MRN: [id number] Date of Birth: 07-02-1946

## 2017-12-09 ENCOUNTER — Encounter: Payer: Self-pay | Admitting: Physical Therapy

## 2017-12-09 ENCOUNTER — Ambulatory Visit: Payer: No Typology Code available for payment source | Admitting: Physical Therapy

## 2017-12-09 DIAGNOSIS — M25572 Pain in left ankle and joints of left foot: Secondary | ICD-10-CM

## 2017-12-09 DIAGNOSIS — R600 Localized edema: Secondary | ICD-10-CM

## 2017-12-09 DIAGNOSIS — M25672 Stiffness of left ankle, not elsewhere classified: Secondary | ICD-10-CM

## 2017-12-09 NOTE — Therapy (Signed)
[facility] [address] [place], [place], [address] Phone: [phone]   Fax:  [phone]  Physical Therapy Treatment  Patient Details  Name: Gary Wheeler MRN: [id number] Date of Birth: 04-Jul-1946 Referring Provider: Fritzi Mandes MD   Encounter Date: 12/09/2017  PT End of Session - 12/09/17 1436    Visit Number  9    Number of Visits  24    Date for PT Re-Evaluation  02/03/18    PT Start Time  0150    PT Stop Time  0254    PT Time Calculation (min)  64 min    Activity Tolerance  Patient tolerated treatment well    Behavior During Therapy  [facility] for tasks assessed/performed       Past Medical History:  Diagnosis Date  . CAD (coronary artery disease) 01/09/15   a. cath 01/09/15 PCI with 2 DES stents in LAD/1 in Dx with comcomitant CAD in distal LAD, RI.   . Diabetes mellitus without complication (HCC)   . Hypertension   . STEMI (ST elevation myocardial infarction) (HCC) 01/09/15    Past Surgical History:  Procedure Laterality Date  . CARDIAC CATHETERIZATION N/A 01/09/2015   Procedure: Left Heart Cath and Coronary Angiography;  Surgeon: Marykay Lex, MD;  Location: [facility] INVASIVE CV LAB;  Service: Cardiovascular;  Laterality: N/A;  . CARDIAC CATHETERIZATION  01/09/2015   Procedure: Coronary Stent Intervention;  Surgeon: Marykay Lex, MD;  Location: [facility] INVASIVE CV LAB;  Service: Cardiovascular;;  . COLONOSCOPY    . ESOPHAGOGASTRODUODENOSCOPY      There were no vitals filed for this visit.  Subjective Assessment - 12/09/17 1442    Subjective  Doctor said I was doing very good and I don't need to come back.  She did say I had some plantar fasciits and told me to stretch and roll my foot on a frozen water bottle.    Currently in Pain?  Yes    Pain Score  4     Pain Location  Ankle    Pain Orientation  Left    Pain Descriptors / Indicators  Sharp;Shooting    Pain Type  Acute pain    Pain Onset  More than a month ago                        [facility] Adult PT Treatment/Exercise - 12/09/17 0001      Exercises   Exercises  Knee/Hip      Knee/Hip Exercises: Aerobic   Stationary Bike  Level 3 x 15 minutes.      Programme researcher, broadcasting/film/video Location  Left ankle.    Electrical Stimulation Action  IFC at 1-10 Hz x 20 minutes.    Electrical Stimulation Goals  Edema;Pain      Vasopneumatic   Number Minutes Vasopneumatic   20 minutes    Vasopnuematic Location   -- Left ankle.    Vasopneumatic Pressure  Medium      Manual Therapy   Soft tissue mobilization  In prone:  STW/M including IASTM x 15 minutes to left calf and medial plantar fascial band.               PT Short Term Goals - 11/11/17 1224      PT SHORT TERM GOAL #1   Title  STG's=LTG's.        PT Long Term Goals - 12/09/17 1455      PT LONG TERM  GOAL #1   Title  Independent with a HEP.    Time  12    Period  Weeks    Status  On-going      PT LONG TERM GOAL #2   Title  Increase left ankle dorsiflexion to 6- 8 degrees to normalize the patient's gait pattern.    Time  12    Period  Weeks    Status  On-going      PT LONG TERM GOAL #3   Title  Increase left ankle strength to 4+ to 5/5 to increase stability for functional tasks.    Time  12    Period  Weeks    Status  On-going      PT LONG TERM GOAL #4   Title  Walk without deviation.    Baseline  Increased stance time over left LE now.    Time  12    Period  Weeks    Status  On-going      PT LONG TERM GOAL #5   Title  Perform ADL's with pain not > 3/10.    Time  12    Period  Weeks    Status  On-going            Plan - 12/09/17 1454    Clinical Impression Statement  Patient making excellent progress.  His doctor was very pleased.  Her had a trigger point in his left medial calf region and tenderness over the medial plantar fascial band.  He responded very well to STW/M.    PT Treatment/Interventions  ADLs/Self Care Home  Management;Cryotherapy;Electrical Stimulation;Ultrasound;Moist Heat;Gait training;Stair training;Therapeutic activities;Therapeutic exercise;Neuromuscular re-education;Patient/family education;Passive range of motion;Manual techniques;Vasopneumatic Device    PT Next Visit Plan  STW/M..IASTM to left calf.    Consulted and Agree with Plan of Care  Patient       Patient will benefit from skilled therapeutic intervention in order to improve the following deficits and impairments:     Visit Diagnosis: Pain in left ankle and joints of left foot  Stiffness of left ankle, not elsewhere classified  Localized edema     Problem List Patient Active Problem List   Diagnosis Date Noted  . Acute ST elevation myocardial infarction (STEMI) involving left anterior descending coronary artery (HCC) 01/09/2015  . ST elevation myocardial infarction (STEMI) involving other coronary artery of inferior wall (HCC) 01/09/2015  . STEMI (ST elevation myocardial infarction) (HCC) 01/09/2015  . Hypertension   . Non-insulin dependent type 2 diabetes mellitus (HCC)   . Essential hypertension   . Coronary artery disease involving native coronary artery of native heart with unstable angina pectoris (HCC)     Gary Wheeler, [place] MPT 12/09/2017, 2:58 PM  [facility] [address] [place], [place], [address] Phone: [phone]   Fax:  [phone]  Name: Gary Wheeler MRN: [id number] Date of Birth: 04-05-1946

## 2017-12-11 ENCOUNTER — Ambulatory Visit: Payer: No Typology Code available for payment source | Admitting: Physical Therapy

## 2017-12-11 DIAGNOSIS — M25572 Pain in left ankle and joints of left foot: Secondary | ICD-10-CM

## 2017-12-11 DIAGNOSIS — M25672 Stiffness of left ankle, not elsewhere classified: Secondary | ICD-10-CM

## 2017-12-11 DIAGNOSIS — R600 Localized edema: Secondary | ICD-10-CM

## 2017-12-11 NOTE — Therapy (Signed)
[facility] [address] [place], [place], [address] Phone: [phone]   Fax:  [phone]  Physical Therapy Treatment  Patient Details  Name: Gary Wheeler MRN: [id number] Date of Birth: 1946/05/18 Referring Provider: Fritzi Mandes MD   Encounter Date: 12/11/2017  PT End of Session - 12/11/17 1350    Visit Number  10    Number of Visits  24    Date for PT Re-Evaluation  02/03/18    PT Start Time  1345    PT Stop Time  1451    PT Time Calculation (min)  66 min    Activity Tolerance  Patient tolerated treatment well    Behavior During Therapy  [facility] for tasks assessed/performed       Past Medical History:  Diagnosis Date  . CAD (coronary artery disease) 01/09/15   a. cath 01/09/15 PCI with 2 DES stents in LAD/1 in Dx with comcomitant CAD in distal LAD, RI.   . Diabetes mellitus without complication (HCC)   . Hypertension   . STEMI (ST elevation myocardial infarction) (HCC) 01/09/15    Past Surgical History:  Procedure Laterality Date  . CARDIAC CATHETERIZATION N/A 01/09/2015   Procedure: Left Heart Cath and Coronary Angiography;  Surgeon: Marykay Lex, MD;  Location: [facility] INVASIVE CV LAB;  Service: Cardiovascular;  Laterality: N/A;  . CARDIAC CATHETERIZATION  01/09/2015   Procedure: Coronary Stent Intervention;  Surgeon: Marykay Lex, MD;  Location: [facility] INVASIVE CV LAB;  Service: Cardiovascular;;  . COLONOSCOPY    . ESOPHAGOGASTRODUODENOSCOPY      There were no vitals filed for this visit.    Progress Note Reporting Period  11/11/17 to 12/11/17  See note below for Objective Data and Assessment of Progress/Goals.       Subjective Assessment - 12/11/17 1923    Subjective  Patient reported IASTM has helped a lot.    Pertinent History  DM    How long can you walk comfortably?  Short community distances.    Patient Stated Goals  Get back to normal.    Currently in Pain?  Yes    Pain Score  3     Pain Location  Ankle     Pain Orientation  Left    Pain Descriptors / Indicators  Sharp    Pain Type  Acute pain    Pain Onset  More than a month ago         [facility] PT Assessment - 12/11/17 0001      Assessment   Medical Diagnosis  S/p ORIF left medial malleolus.                   [facility] Adult PT Treatment/Exercise - 12/11/17 0001      Knee/Hip Exercises: Aerobic   Stationary Bike  Level 3 x 15 minutes.      Programme researcher, broadcasting/film/video Location  Left ankle.    Electrical Stimulation Action  IFC    Electrical Stimulation Parameters  80-150 hz x15 min    Electrical Stimulation Goals  Edema;Pain      Vasopneumatic   Number Minutes Vasopneumatic   15 minutes    Vasopnuematic Location   Ankle    Vasopneumatic Pressure  Medium      Manual Therapy   Soft tissue mobilization  In prone:  STW/M including IASTM to left calf and medial plantar fascial band.      Ankle Exercises: Standing   SLS  1 min x2  with intermittent UE support     Rocker Board  5 minutes;1 minute 6 min total, x4' for stretching, x1' AP balance x1' lat bal.               PT Short Term Goals - 11/11/17 1224      PT SHORT TERM GOAL #1   Title  STG's=LTG's.        PT Long Term Goals - 12/09/17 1455      PT LONG TERM GOAL #1   Title  Independent with a HEP.    Time  12    Period  Weeks    Status  On-going      PT LONG TERM GOAL #2   Title  Increase left ankle dorsiflexion to 6- 8 degrees to normalize the patient's gait pattern.    Time  12    Period  Weeks    Status  On-going      PT LONG TERM GOAL #3   Title  Increase left ankle strength to 4+ to 5/5 to increase stability for functional tasks.    Time  12    Period  Weeks    Status  On-going      PT LONG TERM GOAL #4   Title  Walk without deviation.    Baseline  Increased stance time over left LE now.    Time  12    Period  Weeks    Status  On-going      PT LONG TERM GOAL #5   Title  Perform ADL's with pain not > 3/10.     Time  12    Period  Weeks    Status  On-going            Plan - 12/11/17 1927    Clinical Impression Statement  Patient was able to tolerate treatment well. Patient reported slight discomfort with SLS and required intermittent UE support and CG assist to maintain balance. Patient noted with very minimal trigger points in L proximal medial calf with deep STW/M and IASTM. Normal response to modalities upon removal.    Clinical Presentation  Stable    Clinical Decision Making  Low    Rehab Potential  Excellent    PT Frequency  2x / week    PT Duration  12 weeks    PT Treatment/Interventions  ADLs/Self Care Home Management;Cryotherapy;Electrical Stimulation;Ultrasound;Moist Heat;Gait training;Stair training;Therapeutic activities;Therapeutic exercise;Neuromuscular re-education;Patient/family education;Passive range of motion;Manual techniques;Vasopneumatic Device    PT Next Visit Plan  STW/M..IASTM to left calf. Proprioceptive exercises     Consulted and Agree with Plan of Care  Patient       Patient will benefit from skilled therapeutic intervention in order to improve the following deficits and impairments:  Abnormal gait, Decreased activity tolerance, Decreased range of motion, Increased edema, Pain  Visit Diagnosis: Pain in left ankle and joints of left foot  Stiffness of left ankle, not elsewhere classified  Localized edema     Problem List Patient Active Problem List   Diagnosis Date Noted  . Acute ST elevation myocardial infarction (STEMI) involving left anterior descending coronary artery (HCC) 01/09/2015  . ST elevation myocardial infarction (STEMI) involving other coronary artery of inferior wall (HCC) 01/09/2015  . STEMI (ST elevation myocardial infarction) (HCC) 01/09/2015  . Hypertension   . Non-insulin dependent type 2 diabetes mellitus (HCC)   . Essential hypertension   . Coronary artery disease involving native coronary artery of native heart with unstable  angina pectoris (HCC)  Gary Wheeler, PT, DPT 12/11/2017, 7:31 PM  [facility] [address] [place], [place], [address] Phone: [phone]   Fax:  [phone]  Name: Johan Creveling MRN: [id number] Date of Birth: 09/23/1945

## 2017-12-16 ENCOUNTER — Ambulatory Visit: Payer: No Typology Code available for payment source | Admitting: Physical Therapy

## 2017-12-16 DIAGNOSIS — M25672 Stiffness of left ankle, not elsewhere classified: Secondary | ICD-10-CM

## 2017-12-16 DIAGNOSIS — M25572 Pain in left ankle and joints of left foot: Secondary | ICD-10-CM | POA: Diagnosis not present

## 2017-12-16 DIAGNOSIS — R600 Localized edema: Secondary | ICD-10-CM

## 2017-12-16 NOTE — Therapy (Signed)
[facility] [address] [place], [place], [address] Phone: [phone]   Fax:  [phone]  Physical Therapy Treatment  Patient Details  Name: Gary Wheeler MRN: [id number] Date of Birth: 05-02-1946 Referring Provider: Fritzi Mandes MD   Encounter Date: 12/16/2017  PT End of Session - 12/16/17 1303    Visit Number  11    Number of Visits  24    Date for PT Re-Evaluation  02/03/18    PT Start Time  1300    PT Stop Time  1358    PT Time Calculation (min)  58 min    Activity Tolerance  Patient tolerated treatment well    Behavior During Therapy  [facility] for tasks assessed/performed       Past Medical History:  Diagnosis Date  . CAD (coronary artery disease) 01/09/15   a. cath 01/09/15 PCI with 2 DES stents in LAD/1 in Dx with comcomitant CAD in distal LAD, RI.   . Diabetes mellitus without complication (HCC)   . Hypertension   . STEMI (ST elevation myocardial infarction) (HCC) 01/09/15    Past Surgical History:  Procedure Laterality Date  . CARDIAC CATHETERIZATION N/A 01/09/2015   Procedure: Left Heart Cath and Coronary Angiography;  Surgeon: Marykay Lex, MD;  Location: [facility] INVASIVE CV LAB;  Service: Cardiovascular;  Laterality: N/A;  . CARDIAC CATHETERIZATION  01/09/2015   Procedure: Coronary Stent Intervention;  Surgeon: Marykay Lex, MD;  Location: [facility] INVASIVE CV LAB;  Service: Cardiovascular;;  . COLONOSCOPY    . ESOPHAGOGASTRODUODENOSCOPY      There were no vitals filed for this visit.  Subjective Assessment - 12/16/17 1304    Subjective  Patient reported he is a lot more sore today due to last Thursday's session.    Pertinent History  DM    How long can you walk comfortably?  Short community distances.    Patient Stated Goals  Get back to normal.    Currently in Pain?  Yes    Pain Score  4     Pain Orientation  Left    Pain Descriptors / Indicators  Sharp    Pain Type  Acute pain    Pain Onset  More than a month  ago         [facility] PT Assessment - 12/16/17 0001      Assessment   Medical Diagnosis  S/p ORIF left medial malleolus.                   [facility] Adult PT Treatment/Exercise - 12/16/17 0001      Exercises   Exercises  Knee/Hip      Knee/Hip Exercises: Aerobic   Nustep  Level 4 x 15 minutes.      Programme researcher, broadcasting/film/video Location  Left ankle.    Electrical Stimulation Action  IFC    Electrical Stimulation Parameters  80-150 hz x10 min    Electrical Stimulation Goals  Edema;Pain      Vasopneumatic   Number Minutes Vasopneumatic   10 minutes    Vasopnuematic Location   Ankle    Vasopneumatic Pressure  Medium    Vasopneumatic Temperature   34      Manual Therapy   Soft tissue mobilization  In prone:  STW/M including IASTM to left calf and medial plantar fascial band.    Passive ROM  PROM into DF to improve ROM      Ankle Exercises: Standing   Rocker Board  5 minutes    Other Standing Ankle Exercises  tandem stance 30 seconds each    Other Standing Ankle Exercises  step tapping on 6" step, no UE support x20                PT Short Term Goals - 11/11/17 1224      PT SHORT TERM GOAL #1   Title  STG's=LTG's.        PT Long Term Goals - 12/09/17 1455      PT LONG TERM GOAL #1   Title  Independent with a HEP.    Time  12    Period  Weeks    Status  On-going      PT LONG TERM GOAL #2   Title  Increase left ankle dorsiflexion to 6- 8 degrees to normalize the patient's gait pattern.    Time  12    Period  Weeks    Status  On-going      PT LONG TERM GOAL #3   Title  Increase left ankle strength to 4+ to 5/5 to increase stability for functional tasks.    Time  12    Period  Weeks    Status  On-going      PT LONG TERM GOAL #4   Title  Walk without deviation.    Baseline  Increased stance time over left LE now.    Time  12    Period  Weeks    Status  On-going      PT LONG TERM GOAL #5   Title  Perform ADL's with pain  not > 3/10.    Time  12    Period  Weeks    Status  On-going            Plan - 12/16/17 1347    Clinical Impression Statement  Patien was able to tolerate treatment well despite reports of L ankle soreness. Patient noted with L calf muscle atrophy during heel raises. Patient noted with increased lateral sway with tandem stance but was able to maintain balance with intermittent UE support. Normal response to modalities upon rmeoval.    Clinical Presentation  Stable    Clinical Decision Making  Low    Rehab Potential  Excellent    PT Frequency  2x / week    PT Duration  12 weeks    PT Treatment/Interventions  ADLs/Self Care Home Management;Cryotherapy;Electrical Stimulation;Ultrasound;Moist Heat;Gait training;Stair training;Therapeutic activities;Therapeutic exercise;Neuromuscular re-education;Patient/family education;Passive range of motion;Manual techniques;Vasopneumatic Device    PT Next Visit Plan  STW/M..IASTM to left calf. Proprioceptive exercises     Consulted and Agree with Plan of Care  Patient       Patient will benefit from skilled therapeutic intervention in order to improve the following deficits and impairments:  Abnormal gait, Decreased activity tolerance, Decreased range of motion, Increased edema, Pain  Visit Diagnosis: Pain in left ankle and joints of left foot  Stiffness of left ankle, not elsewhere classified  Localized edema     Problem List Patient Active Problem List   Diagnosis Date Noted  . Acute ST elevation myocardial infarction (STEMI) involving left anterior descending coronary artery (HCC) 01/09/2015  . ST elevation myocardial infarction (STEMI) involving other coronary artery of inferior wall (HCC) 01/09/2015  . STEMI (ST elevation myocardial infarction) (HCC) 01/09/2015  . Hypertension   . Non-insulin dependent type 2 diabetes mellitus (HCC)   . Essential hypertension   . Coronary artery disease involving native coronary artery of  native  heart with unstable angina pectoris [facility])    Guss Bunde, PT, DPT 12/16/2017, 2:35 PM  [facility] [address] [place], [place], [address] Phone: [phone]   Fax:  [phone]  Name: Gary Wheeler MRN: [id number] Date of Birth: 05/26/1946

## 2017-12-18 ENCOUNTER — Encounter: Payer: Non-veteran care | Admitting: Physical Therapy

## 2017-12-23 ENCOUNTER — Encounter: Payer: Non-veteran care | Admitting: Physical Therapy

## 2017-12-25 ENCOUNTER — Encounter: Payer: Self-pay | Admitting: Gastroenterology

## 2017-12-25 ENCOUNTER — Ambulatory Visit: Payer: No Typology Code available for payment source | Admitting: Physical Therapy

## 2017-12-25 DIAGNOSIS — M25572 Pain in left ankle and joints of left foot: Secondary | ICD-10-CM | POA: Diagnosis not present

## 2017-12-25 DIAGNOSIS — R600 Localized edema: Secondary | ICD-10-CM

## 2017-12-25 DIAGNOSIS — M25672 Stiffness of left ankle, not elsewhere classified: Secondary | ICD-10-CM

## 2017-12-25 NOTE — Therapy (Signed)
[facility] [address] [place], [place], [address] Phone: [phone]   Fax:  [phone]  Physical Therapy Treatment  Patient Details  Name: Perle Gibbon MRN: [id number] Date of Birth: [birth year] Referring Provider: Fritzi Mandes MD   Encounter Date: 12/25/2017  PT End of Session - 12/25/17 1455    Visit Number  12    Number of Visits  24    Date for PT Re-Evaluation  02/03/18    PT Start Time  1436    PT Stop Time  1538    PT Time Calculation (min)  62 min    Activity Tolerance  Patient tolerated treatment well    Behavior During Therapy  [facility] for tasks assessed/performed       Past Medical History:  Diagnosis Date  . CAD (coronary artery disease) 01/09/15   a. cath 01/09/15 PCI with 2 DES stents in LAD/1 in Dx with comcomitant CAD in distal LAD, RI.   . Diabetes mellitus without complication (HCC)   . Hypertension   . STEMI (ST elevation myocardial infarction) (HCC) 01/09/15    Past Surgical History:  Procedure Laterality Date  . CARDIAC CATHETERIZATION N/A 01/09/2015   Procedure: Left Heart Cath and Coronary Angiography;  Surgeon: Marykay Lex, MD;  Location: [facility] INVASIVE CV LAB;  Service: Cardiovascular;  Laterality: N/A;  . CARDIAC CATHETERIZATION  01/09/2015   Procedure: Coronary Stent Intervention;  Surgeon: Marykay Lex, MD;  Location: [facility] INVASIVE CV LAB;  Service: Cardiovascular;;  . COLONOSCOPY    . ESOPHAGOGASTRODUODENOSCOPY      There were no vitals filed for this visit.  Subjective Assessment - 12/25/17 2024    Subjective  Patient reported feeling sore but doing alright.    Pertinent History  DM    How long can you walk comfortably?  Short community distances.    Patient Stated Goals  Get back to normal.    Currently in Pain?  Yes    Pain Score  4     Pain Orientation  Left    Pain Descriptors / Indicators  Sharp    Pain Type  Surgical pain;Chronic pain    Pain Onset  More than a month ago    Pain  Frequency  Constant         [facility] PT Assessment - 12/25/17 0001      Assessment   Medical Diagnosis  S/p ORIF left medial malleolus.                   [facility] Adult PT Treatment/Exercise - 12/25/17 0001      Exercises   Exercises  Knee/Hip      Knee/Hip Exercises: Aerobic   Stationary Bike  Level 4 x 15 minutes.      Programme researcher, broadcasting/film/video Location  Left ankle.    Engineer, manufacturing  IFC    Electrical Stimulation Parameters  80-150 hz x10    Electrical Stimulation Goals  Edema;Pain      Vasopneumatic   Number Minutes Vasopneumatic   10 minutes    Vasopnuematic Location   Ankle    Vasopneumatic Pressure  Medium      Manual Therapy   Soft tissue mobilization  In prone: IASTM to medial plantar fascial band      Ankle Exercises: Standing   SLS  3x40 second hold with intermittent UE support    Heel Raises  Both;10 reps;20 reps;2 seconds off 4" step    Other Standing  Ankle Exercises  step downs 4" step 3x10      Ankle Exercises: Stretches   Other Stretch  prostretch x5 minutes               PT Short Term Goals - 11/11/17 1224      PT SHORT TERM GOAL #1   Title  STG's=LTG's.        PT Long Term Goals - 12/09/17 1455      PT LONG TERM GOAL #1   Title  Independent with a HEP.    Time  12    Period  Weeks    Status  On-going      PT LONG TERM GOAL #2   Title  Increase left ankle dorsiflexion to 6- 8 degrees to normalize the patient's gait pattern.    Time  12    Period  Weeks    Status  On-going      PT LONG TERM GOAL #3   Title  Increase left ankle strength to 4+ to 5/5 to increase stability for functional tasks.    Time  12    Period  Weeks    Status  On-going      PT LONG TERM GOAL #4   Title  Walk without deviation.    Baseline  Increased stance time over left LE now.    Time  12    Period  Weeks    Status  On-going      PT LONG TERM GOAL #5   Title  Perform ADL's with pain not > 3/10.     Time  12    Period  Weeks    Status  On-going            Plan - 12/25/17 2014    Clinical Impression Statement  Patient was able to tolerate treatment with minimal reports of discomfort in the left ankle. Patient noted with minimal plantar fascia adhesions during IASTM. Patient noted with a decrease in pain after end of session. Normal response of modalities upon removal.    Clinical Presentation  Stable    Clinical Decision Making  Low    Rehab Potential  Excellent    PT Frequency  2x / week    PT Duration  12 weeks    PT Treatment/Interventions  ADLs/Self Care Home Management;Cryotherapy;Electrical Stimulation;Ultrasound;Moist Heat;Gait training;Stair training;Therapeutic activities;Therapeutic exercise;Neuromuscular re-education;Patient/family education;Passive range of motion;Manual techniques;Vasopneumatic Device    PT Next Visit Plan  Continue proprioceptive exercises modalities PRN as needed.    Consulted and Agree with Plan of Care  Patient       Patient will benefit from skilled therapeutic intervention in order to improve the following deficits and impairments:  Abnormal gait, Decreased activity tolerance, Decreased range of motion, Increased edema, Pain  Visit Diagnosis: Pain in left ankle and joints of left foot  Stiffness of left ankle, not elsewhere classified  Localized edema     Problem List Patient Active Problem List   Diagnosis Date Noted  . Acute ST elevation myocardial infarction (STEMI) involving left anterior descending coronary artery (HCC) 01/09/2015  . ST elevation myocardial infarction (STEMI) involving other coronary artery of inferior wall (HCC) 01/09/2015  . STEMI (ST elevation myocardial infarction) (HCC) 01/09/2015  . Hypertension   . Non-insulin dependent type 2 diabetes mellitus (HCC)   . Essential hypertension   . Coronary artery disease involving native coronary artery of native heart with unstable angina pectoris (HCC)     Guss Bunde, PT, DPT  12/25/2017, 8:25 PM  [facility] [address] [place], [place], [address] Phone: [phone]   Fax:  [phone]  Name: Bowie Delia MRN: [id number] Date of Birth: August 10, 1945

## 2017-12-30 ENCOUNTER — Ambulatory Visit: Payer: No Typology Code available for payment source | Attending: Orthopedic Surgery | Admitting: Physical Therapy

## 2017-12-30 DIAGNOSIS — M25572 Pain in left ankle and joints of left foot: Secondary | ICD-10-CM | POA: Insufficient documentation

## 2017-12-30 DIAGNOSIS — M25672 Stiffness of left ankle, not elsewhere classified: Secondary | ICD-10-CM | POA: Diagnosis present

## 2017-12-30 DIAGNOSIS — R600 Localized edema: Secondary | ICD-10-CM | POA: Insufficient documentation

## 2017-12-30 NOTE — Therapy (Signed)
[facility] [address] [place], [place], [address] Phone: [phone]   Fax:  [phone]  Physical Therapy Treatment  Patient Details  Name: Gary Wheeler MRN: [id number] Date of Birth: [birth year] Referring Provider: Fritzi MandesJustine Crowley MD   Encounter Date: 12/30/2017  PT End of Session - 12/30/17 1433    Visit Number  13    Number of Visits  24    Date for PT Re-Evaluation  02/03/18    PT Start Time  1430    PT Stop Time  1528    PT Time Calculation (min)  58 min    Activity Tolerance  Patient tolerated treatment well    Behavior During Therapy  [facility] for tasks assessed/performed       Past Medical History:  Diagnosis Date  . CAD (coronary artery disease) 01/09/15   a. cath 01/09/15 PCI with 2 DES stents in LAD/1 in Dx with comcomitant CAD in distal LAD, RI.   . Diabetes mellitus without complication (HCC)   . Hypertension   . STEMI (ST elevation myocardial infarction) (HCC) 01/09/15    Past Surgical History:  Procedure Laterality Date  . CARDIAC CATHETERIZATION N/A 01/09/2015   Procedure: Left Heart Cath and Coronary Angiography;  Surgeon: Marykay Lexavid W Harding, MD;  [facility];  Service: Cardiovascular;  Laterality: N/A;  . CARDIAC CATHETERIZATION  01/09/2015   Procedure: Coronary Stent Intervention;  Surgeon: Marykay Lexavid W Harding, MD;  [facility];  Service: Cardiovascular;;  . COLONOSCOPY    . ESOPHAGOGASTRODUODENOSCOPY      There were no vitals filed for this visit.  Subjective Assessment - 12/30/17 1736    Subjective  Patient reported no new complaints    Pertinent History  DM    How long can you walk comfortably?  Short community distances.    Currently in Pain?  Yes    Pain Score  4     Pain Orientation  Left    Pain Descriptors / Indicators  Sharp    Pain Type  Surgical pain    Pain Onset  More than a month ago    Pain Frequency  Intermittent         [facility] PT Assessment - 12/30/17 0001       Assessment   Medical Diagnosis  S/p ORIF left medial malleolus.                   [facility] Adult PT Treatment/Exercise - 12/30/17 0001      Exercises   Exercises  Knee/Hip      Knee/Hip Exercises: Aerobic   Nustep  Level 5, x15 min      Electrical Stimulation   Electrical Stimulation Location  Left ankle.    Engineer, manufacturinglectrical Stimulation Action  IFC    Electrical Stimulation Parameters  80-150 hz x15    Electrical Stimulation Goals  Edema;Pain      Vasopneumatic   Number Minutes Vasopneumatic   15 minutes    Vasopnuematic Location   Ankle    Vasopneumatic Pressure  Medium    Vasopneumatic Temperature   40      Ankle Exercises: Standing   SLS  with 3 vectors 2x5 with intermittent support    Rocker Board  5 minutes    Heel Raises  Both;10 reps;20 reps;2 seconds               PT Short Term Goals - 11/11/17 1224      PT SHORT TERM  GOAL #1   Title  STG's=LTG's.        PT Long Term Goals - 12/09/17 1455      PT LONG TERM GOAL #1   Title  Independent with a HEP.    Time  12    Period  Weeks    Status  On-going      PT LONG TERM GOAL #2   Title  Increase left ankle dorsiflexion to 6- 8 degrees to normalize the patient's gait pattern.    Time  12    Period  Weeks    Status  On-going      PT LONG TERM GOAL #3   Title  Increase left ankle strength to 4+ to 5/5 to increase stability for functional tasks.    Time  12    Period  Weeks    Status  On-going      PT LONG TERM GOAL #4   Title  Walk without deviation.    Baseline  Increased stance time over left LE now.    Time  12    Period  Weeks    Status  On-going      PT LONG TERM GOAL #5   Title  Perform ADL's with pain not > 3/10.    Time  12    Period  Weeks    Status  On-going            Plan - 12/30/17 1736    Clinical Impression Statement  Patient was able to tolerate treatment well. Patient was able to perform SLS with color pod taps with close supervision and intermittent UE  support and toe touch down. Patient inquired about his swelling and loss of sensation, patient educated swelling is a normal process of healing and it will take time for his nerves to heal to gain back full sensation. Patient reported understanding. No adverse effects found upon removal of modalities.    Clinical Presentation  Stable    Clinical Decision Making  Low    Rehab Potential  Excellent    PT Frequency  2x / week    PT Duration  12 weeks    PT Treatment/Interventions  ADLs/Self Care Home Management;Cryotherapy;Electrical Stimulation;Ultrasound;Moist Heat;Gait training;Stair training;Therapeutic activities;Therapeutic exercise;Neuromuscular re-education;Patient/family education;Passive range of motion;Manual techniques;Vasopneumatic Device    PT Next Visit Plan  Continue proprioceptive exercises modalities PRN as needed.    Consulted and Agree with Plan of Care  Patient       Patient will benefit from skilled therapeutic intervention in order to improve the following deficits and impairments:  Abnormal gait, Decreased activity tolerance, Decreased range of motion, Increased edema, Pain  Visit Diagnosis: Pain in left ankle and joints of left foot  Stiffness of left ankle, not elsewhere classified  Localized edema     Problem List Patient Active Problem List   Diagnosis Date Noted  . Acute ST elevation myocardial infarction (STEMI) involving left anterior descending coronary artery (HCC) 01/09/2015  . ST elevation myocardial infarction (STEMI) involving other coronary artery of inferior wall (HCC) 01/09/2015  . STEMI (ST elevation myocardial infarction) (HCC) 01/09/2015  . Hypertension   . Non-insulin dependent type 2 diabetes mellitus (HCC)   . Essential hypertension   . Coronary artery disease involving native coronary artery of native heart with unstable angina pectoris (HCC)    Guss Bunde, PT, DPT 12/30/2017, 5:42 PM  [facility] [address] [place], [place], [address] Phone: [phone]   Fax:  [phone]  Name: Gary Wheeler MRN: [id number] Date of Birth: March 18, 1946

## 2018-01-01 ENCOUNTER — Encounter: Payer: Non-veteran care | Admitting: Physical Therapy

## 2018-01-06 ENCOUNTER — Ambulatory Visit: Payer: No Typology Code available for payment source | Admitting: Physical Therapy

## 2018-01-06 DIAGNOSIS — M25572 Pain in left ankle and joints of left foot: Secondary | ICD-10-CM | POA: Diagnosis not present

## 2018-01-06 DIAGNOSIS — M25672 Stiffness of left ankle, not elsewhere classified: Secondary | ICD-10-CM

## 2018-01-06 DIAGNOSIS — R600 Localized edema: Secondary | ICD-10-CM

## 2018-01-06 NOTE — Therapy (Signed)
[facility] [address] [place], [place], [address] Phone: [phone]   Fax:  [phone]  Physical Therapy Treatment  Patient Details  Name: Gary Wheeler MRN: [id number] Date of Birth: August 09, 1945 Referring Provider: Fritzi Mandes MD   Encounter Date: 01/06/2018  PT End of Session - 01/06/18 1304    Visit Number  14    Number of Visits  24    Date for PT Re-Evaluation  02/03/18    PT Start Time  0100    PT Stop Time  0155    PT Time Calculation (min)  55 min    Activity Tolerance  Patient tolerated treatment well    Behavior During Therapy  [facility] for tasks assessed/performed       Past Medical History:  Diagnosis Date  . CAD (coronary artery disease) 01/09/15   a. cath 01/09/15 PCI with 2 DES stents in LAD/1 in Dx with comcomitant CAD in distal LAD, RI.   . Diabetes mellitus without complication (HCC)   . Hypertension   . STEMI (ST elevation myocardial infarction) (HCC) 01/09/15    Past Surgical History:  Procedure Laterality Date  . CARDIAC CATHETERIZATION N/A 01/09/2015   Procedure: Left Heart Cath and Coronary Angiography;  Surgeon: Marykay Lex, MD;  Location: [facility] INVASIVE CV LAB;  Service: Cardiovascular;  Laterality: N/A;  . CARDIAC CATHETERIZATION  01/09/2015   Procedure: Coronary Stent Intervention;  Surgeon: Marykay Lex, MD;  Location: [facility] INVASIVE CV LAB;  Service: Cardiovascular;;  . COLONOSCOPY    . ESOPHAGOGASTRODUODENOSCOPY      There were no vitals filed for this visit.  Subjective Assessment - 01/06/18 1305    Subjective  Patient reported ankle is feeling tired and sore today because he stayed up on it for a while yesterday.    Pertinent History  DM    How long can you walk comfortably?  Short community distances.    Patient Stated Goals  Get back to normal.    Currently in Pain?  Yes    Pain Score  5     Pain Location  Ankle    Pain Orientation  Left    Pain Descriptors / Indicators  Sharp    Pain Type  Surgical pain    Pain Onset  More than a month ago    Pain Frequency  Intermittent         [facility] PT Assessment - 01/06/18 0001      Assessment   Medical Diagnosis  S/p ORIF left medial malleolus.                   [facility] Adult PT Treatment/Exercise - 01/06/18 0001      Exercises   Exercises  Knee/Hip      Knee/Hip Exercises: Aerobic   Stationary Bike  Level 5 x 15 minutes.      Programme researcher, broadcasting/film/video Location  Left ankle.    Engineer, manufacturing  IFC    Electrical Stimulation Parameters  80-150 hz x15    Electrical Stimulation Goals  Edema;Pain      Vasopneumatic   Number Minutes Vasopneumatic   15 minutes    Vasopnuematic Location   Ankle    Vasopneumatic Pressure  Medium      Ankle Exercises: Standing   Rocker Board  5 minutes    Balance Beam  lateral stepping followed by tandem walking 5 reps each    Other Standing Ankle Exercises  balance on bosu  x3 mins    Other Standing Ankle Exercises  circles CW/CCW on dynadisc 1 minute each               PT Short Term Goals - 11/11/17 1224      PT SHORT TERM GOAL #1   Title  STG's=LTG's.        PT Long Term Goals - 12/09/17 1455      PT LONG TERM GOAL #1   Title  Independent with a HEP.    Time  12    Period  Weeks    Status  On-going      PT LONG TERM GOAL #2   Title  Increase left ankle dorsiflexion to 6- 8 degrees to normalize the patient's gait pattern.    Time  12    Period  Weeks    Status  On-going      PT LONG TERM GOAL #3   Title  Increase left ankle strength to 4+ to 5/5 to increase stability for functional tasks.    Time  12    Period  Weeks    Status  On-going      PT LONG TERM GOAL #4   Title  Walk without deviation.    Baseline  Increased stance time over left LE now.    Time  12    Period  Weeks    Status  On-going      PT LONG TERM GOAL #5   Title  Perform ADL's with pain not > 3/10.    Time  12    Period  Weeks     Status  On-going            Plan - 01/06/18 1423    Clinical Impression Statement  Patient was able to tolerate treatment well. Patient demonstrates good balance with balance beam tandem walking and lateral walking as demonstrated by intermittent one UE support and no loss of balance. Normal response to modalities upon removal.     Clinical Presentation  Stable    Clinical Decision Making  Low    Rehab Potential  Excellent    PT Frequency  2x / week    PT Duration  12 weeks    PT Treatment/Interventions  ADLs/Self Care Home Management;Cryotherapy;Electrical Stimulation;Ultrasound;Moist Heat;Gait training;Stair training;Therapeutic activities;Therapeutic exercise;Neuromuscular re-education;Patient/family education;Passive range of motion;Manual techniques;Vasopneumatic Device    PT Next Visit Plan  Continue proprioceptive exercises modalities PRN as needed.    Consulted and Agree with Plan of Care  Patient       Patient will benefit from skilled therapeutic intervention in order to improve the following deficits and impairments:  Abnormal gait, Decreased activity tolerance, Decreased range of motion, Increased edema, Pain  Visit Diagnosis: Pain in left ankle and joints of left foot  Stiffness of left ankle, not elsewhere classified  Localized edema     Problem List Patient Active Problem List   Diagnosis Date Noted  . Acute ST elevation myocardial infarction (STEMI) involving left anterior descending coronary artery (HCC) 01/09/2015  . ST elevation myocardial infarction (STEMI) involving other coronary artery of inferior wall (HCC) 01/09/2015  . STEMI (ST elevation myocardial infarction) (HCC) 01/09/2015  . Hypertension   . Non-insulin dependent type 2 diabetes mellitus (HCC)   . Essential hypertension   . Coronary artery disease involving native coronary artery of native heart with unstable angina pectoris (HCC)    Guss BundeKrystle Tyisha Cressy, PT, DPT 01/06/2018, 2:34 PM  [facility] [address] [place], [place], [address] Phone: [phone]   Fax:  [phone]  Name: Gary Wheeler MRN: [id number] Date of Birth: 10-24-1945

## 2018-01-08 ENCOUNTER — Encounter: Payer: Self-pay | Admitting: Physical Therapy

## 2018-01-08 ENCOUNTER — Ambulatory Visit: Payer: No Typology Code available for payment source | Admitting: Physical Therapy

## 2018-01-08 DIAGNOSIS — R600 Localized edema: Secondary | ICD-10-CM

## 2018-01-08 DIAGNOSIS — M25672 Stiffness of left ankle, not elsewhere classified: Secondary | ICD-10-CM

## 2018-01-08 DIAGNOSIS — M25572 Pain in left ankle and joints of left foot: Secondary | ICD-10-CM

## 2018-01-08 NOTE — Therapy (Signed)
[facility] [address] [place], [place], [address] Phone: [phone]   Fax:  [phone]  Physical Therapy Treatment  Patient Details  Name: Gary Wheeler MRN: [id number] Date of Birth: 1945-08-20 Referring Provider: Fritzi MandesJustine Crowley MD   Encounter Date: 01/08/2018  PT End of Session - 01/08/18 1349    Visit Number  15    Number of Visits  24    Date for PT Re-Evaluation  02/03/18    PT Start Time  1300    PT Stop Time  1355    PT Time Calculation (min)  55 min    Activity Tolerance  Patient tolerated treatment well    Behavior During Therapy  [facility] for tasks assessed/performed       Past Medical History:  Diagnosis Date  . CAD (coronary artery disease) 01/09/15   a. cath 01/09/15 PCI with 2 DES stents in LAD/1 in Dx with comcomitant CAD in distal LAD, RI.   . Diabetes mellitus without complication (HCC)   . Hypertension   . STEMI (ST elevation myocardial infarction) (HCC) 01/09/15    Past Surgical History:  Procedure Laterality Date  . CARDIAC CATHETERIZATION N/A 01/09/2015   Procedure: Left Heart Cath and Coronary Angiography;  Surgeon: Marykay Lexavid W Harding, MD;  [facility];  Service: Cardiovascular;  Laterality: N/A;  . CARDIAC CATHETERIZATION  01/09/2015   Procedure: Coronary Stent Intervention;  Surgeon: Marykay Lexavid W Harding, MD;  [facility];  Service: Cardiovascular;;  . COLONOSCOPY    . ESOPHAGOGASTRODUODENOSCOPY      There were no vitals filed for this visit.  Subjective Assessment - 01/08/18 1347    Subjective  Pt reported his last therapy session "wore him out". Pt reported he has been more active on his feet over the last several days and has reported fatigue.     Pertinent History  DM    How long can you walk comfortably?  Short community distances.    Patient Stated Goals  Get back to normal.    Currently in Pain?  Yes    Pain Score  4     Pain Location  Ankle    Pain Orientation   Left    Pain Descriptors / Indicators  Aching;Sharp    Pain Type  Surgical pain    Pain Onset  More than a month ago         [facility] - 01/08/18 0001      Assessment   Medical Diagnosis  S/p ORIF left medial malleolus.                   [facility] Treatment/Exercise - 01/08/18 0001      Knee/Hip Exercises: Aerobic   Stationary Bike  Level 5 x 15 minutes.      Knee/Hip Exercises: Standing   Other Standing Knee Exercises  single leg stand with only a heel touch on R LE, from mat table to fully standing x 10 no UE support      Electrical Stimulation   Electrical Stimulation Location  Left ankle.    Electrical Stimulation Action  IFC    Electrical Stimulation Parameters  80-150 Hz x 15 minutes    Electrical Stimulation Goals  Edema;Pain      Vasopneumatic   Number Minutes Vasopneumatic   15 minutes    Vasopnuematic Location   Ankle    Vasopneumatic Pressure  Medium    Vasopneumatic Temperature   36  Ankle Exercises: Standing   Rocker Board  4 minutes    Balance Beam  lateral stepping followed by tandem walking 5 reps each    Other Standing Ankle Exercises  balance on BOSU x3 mins, weight shifting side to side and forward and back, domeup and dome down    Other Standing Ankle Exercises  circles CW/CCW on dynadisc 1 minute each only on L LE               PT Short Term Goals - 11/11/17 1224      PT SHORT TERM GOAL #1   Title  STG's=LTG's.        PT Long Term Goals - 01/08/18 1353      PT LONG TERM GOAL #1   Title  Independent with a HEP.    Time  12    Period  Weeks    Status  On-going      PT LONG TERM GOAL #2   Title  Increase left ankle dorsiflexion to 6- 8 degrees to normalize the patient's gait pattern.    Time  12    Period  Weeks    Status  On-going      PT LONG TERM GOAL #3   Title  Increase left ankle strength to 4+ to 5/5 to increase stability for functional tasks.    Time  12    Period  Weeks    Status   On-going      PT LONG TERM GOAL #4   Title  Walk without deviation.    Baseline  Increased stance time over left LE now.    Time  12    Period  Weeks    Status  On-going      PT LONG TERM GOAL #5   Title  Perform ADL's with pain not > 3/10.    Time  12    Period  Weeks    Status  On-going            Plan - 01/08/18 1350    Clinical Impression Statement  Pt tolerating all exericses well with no reports in increased pain but reporting fatigue at end of session. Pt still progressing with strength and ROM of his left ankle along with high level balance exericses and SLS. Continue skilled PT as pt progresses toward LTG's.     Rehab Potential  Excellent    PT Frequency  2x / week    PT Treatment/Interventions  ADLs/Self Care Home Management;Cryotherapy;Electrical Stimulation;Ultrasound;Moist Heat;Gait training;Stair training;Therapeutic activities;Therapeutic exercise;Neuromuscular re-education;Patient/family education;Passive range of motion;Manual techniques;Vasopneumatic Device    PT Next Visit Plan  Continue proprioceptive exercises modalities PRN as needed.    Consulted and Agree with Plan of Care  Patient       Patient will benefit from skilled therapeutic intervention in order to improve the following deficits and impairments:  Abnormal gait, Decreased activity tolerance, Decreased range of motion, Increased edema, Pain  Visit Diagnosis: Pain in left ankle and joints of left foot  Stiffness of left ankle, not elsewhere classified  Localized edema     Problem List Patient Active Problem List   Diagnosis Date Noted  . Acute ST elevation myocardial infarction (STEMI) involving left anterior descending coronary artery (HCC) 01/09/2015  . ST elevation myocardial infarction (STEMI) involving other coronary artery of inferior wall (HCC) 01/09/2015  . STEMI (ST elevation myocardial infarction) (HCC) 01/09/2015  . Hypertension   . Non-insulin dependent type 2 diabetes  mellitus (HCC)   .  Essential hypertension   . Coronary artery disease involving native coronary artery of native heart with unstable angina pectoris [facility])     Sharmon Leyden, MPT 01/08/2018, 2:02 PM  [facility] [address] [place], [place], [address] Phone: [phone]   Fax:  [phone]  Name: Gary Wheeler MRN: [id number] Date of Birth: 03-01-1946

## 2018-01-13 ENCOUNTER — Encounter: Payer: Self-pay | Admitting: Physical Therapy

## 2018-01-13 ENCOUNTER — Ambulatory Visit: Payer: No Typology Code available for payment source | Admitting: Physical Therapy

## 2018-01-13 DIAGNOSIS — R600 Localized edema: Secondary | ICD-10-CM

## 2018-01-13 DIAGNOSIS — M25572 Pain in left ankle and joints of left foot: Secondary | ICD-10-CM | POA: Diagnosis not present

## 2018-01-13 DIAGNOSIS — M25672 Stiffness of left ankle, not elsewhere classified: Secondary | ICD-10-CM

## 2018-01-13 NOTE — Therapy (Signed)
[facility] [address] [place], [place], [address] Phone: [phone]   Fax:  [phone]  Physical Therapy Treatment  Patient Details  Name: Sherril CroonFairley Micucci MRN: [id number] Date of Birth: 1946/05/20 Referring Provider: Fritzi MandesJustine Crowley MD   Encounter Date: 01/13/2018  PT End of Session - 01/13/18 1440    Visit Number  16    Number of Visits  24    Date for PT Re-Evaluation  02/03/18    PT Start Time  0148    PT Stop Time  0229    PT Time Calculation (min)  41 min       Past Medical History:  Diagnosis Date  . CAD (coronary artery disease) 01/09/15   a. cath 01/09/15 PCI with 2 DES stents in LAD/1 in Dx with comcomitant CAD in distal LAD, RI.   . Diabetes mellitus without complication (HCC)   . Hypertension   . STEMI (ST elevation myocardial infarction) (HCC) 01/09/15    Past Surgical History:  Procedure Laterality Date  . CARDIAC CATHETERIZATION N/A 01/09/2015   Procedure: Left Heart Cath and Coronary Angiography;  Surgeon: Marykay Lexavid W Harding, MD;  [facility];  Service: Cardiovascular;  Laterality: N/A;  . CARDIAC CATHETERIZATION  01/09/2015   Procedure: Coronary Stent Intervention;  Surgeon: Marykay Lexavid W Harding, MD;  [facility];  Service: Cardiovascular;;  . COLONOSCOPY    . ESOPHAGOGASTRODUODENOSCOPY      There were no vitals filed for this visit.  Subjective Assessment - 01/13/18 1542    Subjective  Doing good.  Would like that that treatment you did awhile back on me.    Currently in Pain?  Yes    Pain Score  4     Pain Location  Ankle    Pain Orientation  Left    Pain Descriptors / Indicators  Aching;Sharp    Pain Onset  More than a month ago                       [facility]/Exercise - 01/13/18 0001      Exercises   Exercises  Knee/Hip      Knee/Hip Exercises: Aerobic   Stationary Bike  Level 4 x 20 minutes.      Manual Therapy   Soft tissue  mobilization  In prone:  IASTM x 13 minutes to left calf, Achilles and plantar fascia.          Balance Exercises - 01/13/18 1543      Balance Exercises: Standing   Other Standing Exercises  Inverted BOSU in parallel bars x 5 minutes.          PT Short Term Goals - 11/11/17 1224      PT SHORT TERM GOAL #1   Title  STG's=LTG's.        PT Long Term Goals - 01/08/18 1353      PT LONG TERM GOAL #1   Title  Independent with a HEP.    Time  12    Period  Weeks    Status  On-going      PT LONG TERM GOAL #2   Title  Increase left ankle dorsiflexion to 6- 8 degrees to normalize the patient's gait pattern.    Time  12    Period  Weeks    Status  On-going      PT LONG TERM GOAL #3   Title  Increase left ankle strength to  4+ to 5/5 to increase stability for functional tasks.    Time  12    Period  Weeks    Status  On-going      PT LONG TERM GOAL #4   Title  Walk without deviation.    Baseline  Increased stance time over left LE now.    Time  12    Period  Weeks    Status  On-going      PT LONG TERM GOAL #5   Title  Perform ADL's with pain not > 3/10.    Time  12    Period  Weeks    Status  On-going            Plan - 01/13/18 1545    Clinical Impression Statement  Excellent job today with proprioception drill of inverted BOSU and an excellent response to STW/M    PT Treatment/Interventions  ADLs/Self Care Home Management;Cryotherapy;Electrical Stimulation;Ultrasound;Moist Heat;Gait training;Stair training;Therapeutic activities;Therapeutic exercise;Neuromuscular re-education;Patient/family education;Passive range of motion;Manual techniques;Vasopneumatic Device    PT Next Visit Plan  Continue proprioceptive exercises modalities PRN as needed.    Consulted and Agree with Plan of Care  Patient       Patient will benefit from skilled therapeutic intervention in order to improve the following deficits and impairments:     Visit Diagnosis: Pain in left ankle  and joints of left foot  Stiffness of left ankle, not elsewhere classified  Localized edema     Problem List Patient Active Problem List   Diagnosis Date Noted  . Acute ST elevation myocardial infarction (STEMI) involving left anterior descending coronary artery (HCC) 01/09/2015  . ST elevation myocardial infarction (STEMI) involving other coronary artery of inferior wall (HCC) 01/09/2015  . STEMI (ST elevation myocardial infarction) (HCC) 01/09/2015  . Hypertension   . Non-insulin dependent type 2 diabetes mellitus (HCC)   . Essential hypertension   . Coronary artery disease involving native coronary artery of native heart with unstable angina pectoris (HCC)     Kentrell Hallahan, [place] MPT 01/13/2018, 4:08 PM  [facility] [address] [place], [place], [address] Phone: [phone]   Fax:  [phone]  Name: Maanav Kassabian MRN: [id number] Date of Birth: 29-Jun-1946

## 2018-01-15 ENCOUNTER — Ambulatory Visit: Payer: No Typology Code available for payment source | Admitting: Physical Therapy

## 2018-01-15 DIAGNOSIS — M25672 Stiffness of left ankle, not elsewhere classified: Secondary | ICD-10-CM

## 2018-01-15 DIAGNOSIS — M25572 Pain in left ankle and joints of left foot: Secondary | ICD-10-CM

## 2018-01-15 DIAGNOSIS — R600 Localized edema: Secondary | ICD-10-CM

## 2018-01-15 NOTE — Therapy (Signed)
[facility] [address] [place], [place], [address] Phone: [phone]   Fax:  [phone]  Physical Therapy Treatment  Patient Details  Name: Gary Wheeler MRN: [id number] Date of Birth: 12-24-1945 Referring Provider: Fritzi Mandes MD   Encounter Date: 01/15/2018  PT End of Session - 01/15/18 1400    Visit Number  17    Number of Visits  24    Date for PT Re-Evaluation  02/03/18    PT Start Time  1347    PT Stop Time  1443    PT Time Calculation (min)  56 min    Activity Tolerance  Patient tolerated treatment well    Behavior During Therapy  [facility] for tasks assessed/performed       Past Medical History:  Diagnosis Date  . CAD (coronary artery disease) 01/09/15   a. cath 01/09/15 PCI with 2 DES stents in LAD/1 in Dx with comcomitant CAD in distal LAD, RI.   . Diabetes mellitus without complication (HCC)   . Hypertension   . STEMI (ST elevation myocardial infarction) (HCC) 01/09/15    Past Surgical History:  Procedure Laterality Date  . CARDIAC CATHETERIZATION N/A 01/09/2015   Procedure: Left Heart Cath and Coronary Angiography;  Surgeon: Marykay Lex, MD;  Location: [facility] INVASIVE CV LAB;  Service: Cardiovascular;  Laterality: N/A;  . CARDIAC CATHETERIZATION  01/09/2015   Procedure: Coronary Stent Intervention;  Surgeon: Marykay Lex, MD;  Location[facility] INVASIVE CV LAB;  Service: Cardiovascular;;  . COLONOSCOPY    . ESOPHAGOGASTRODUODENOSCOPY      There were no vitals filed for this visit.  Subjective Assessment - 01/15/18 1402    Subjective  Patient reported doing well.    Pertinent History  DM    How long can you walk comfortably?  Short community distances.    Patient Stated Goals  Get back to normal.    Currently in Pain?  Yes    Pain Score  2     Pain Location  Ankle    Pain Orientation  Left    Pain Descriptors / Indicators  Aching;Sharp    Pain Type  Surgical pain    Pain Onset  More than a month ago          [facility] PT Assessment - 01/15/18 0001      Assessment   Medical Diagnosis  S/p ORIF left medial malleolus.                   [facility] Adult PT Treatment/Exercise - 01/15/18 0001      Exercises   Exercises  Ankle      Electrical Stimulation   Electrical Stimulation Location  Left ankle.    Electrical Stimulation Action  IFC    Electrical Stimulation Parameters  80-150 hz x10 min    Electrical Stimulation Goals  Edema;Pain      Vasopneumatic   Number Minutes Vasopneumatic   10 minutes    Vasopnuematic Location   Ankle    Vasopneumatic Pressure  Low    Vasopneumatic Temperature   34      Manual Therapy   Soft tissue mobilization  In prone:  IASTM to left calf, Achilles and plantar fascia.      Ankle Exercises: Aerobic   Nustep  level 5 x15 mins      Ankle Exercises: Standing   Other Standing Ankle Exercises  mini squats in parallel bars x30          Balance  Exercises - 01/15/18 1457      Balance Exercises: Standing   Balance Beam  Lateral stepping intermittent UE support x3 minutes    Other Standing Exercises  Inverted BOSU in parallel bars no UE support x 5 minutes.          PT Short Term Goals - 11/11/17 1224      PT SHORT TERM GOAL #1   Title  STG's=LTG's.        PT Long Term Goals - 01/08/18 1353      PT LONG TERM GOAL #1   Title  Independent with a HEP.    Time  12    Period  Weeks    Status  On-going      PT LONG TERM GOAL #2   Title  Increase left ankle dorsiflexion to 6- 8 degrees to normalize the patient's gait pattern.    Time  12    Period  Weeks    Status  On-going      PT LONG TERM GOAL #3   Title  Increase left ankle strength to 4+ to 5/5 to increase stability for functional tasks.    Time  12    Period  Weeks    Status  On-going      PT LONG TERM GOAL #4   Title  Walk without deviation.    Baseline  Increased stance time over left LE now.    Time  12    Period  Weeks    Status  On-going      PT LONG TERM  GOAL #5   Title  Perform ADL's with pain not > 3/10.    Time  12    Period  Weeks    Status  On-going            Plan - 01/15/18 1449    Clinical Impression Statement  Patient was able to tolerate treatment well today with minimal reports of soreness with exercises. Patient noted with improved balance as noted by the ability to perform inverted bosu with distant supervision. Normal response to modalities upon removal.    Clinical Presentation  Stable    Clinical Decision Making  Low    Rehab Potential  Excellent    PT Frequency  2x / week    PT Duration  12 weeks    PT Treatment/Interventions  ADLs/Self Care Home Management;Cryotherapy;Electrical Stimulation;Ultrasound;Moist Heat;Gait training;Stair training;Therapeutic activities;Therapeutic exercise;Neuromuscular re-education;Patient/family education;Passive range of motion;Manual techniques;Vasopneumatic Device    PT Next Visit Plan  Continue proprioceptive exercises modalities PRN as needed.    Consulted and Agree with Plan of Care  Patient       Patient will benefit from skilled therapeutic intervention in order to improve the following deficits and impairments:  Abnormal gait, Decreased activity tolerance, Decreased range of motion, Increased edema, Pain  Visit Diagnosis: Pain in left ankle and joints of left foot  Stiffness of left ankle, not elsewhere classified  Localized edema     Problem List Patient Active Problem List   Diagnosis Date Noted  . Acute ST elevation myocardial infarction (STEMI) involving left anterior descending coronary artery (HCC) 01/09/2015  . ST elevation myocardial infarction (STEMI) involving other coronary artery of inferior wall (HCC) 01/09/2015  . STEMI (ST elevation myocardial infarction) (HCC) 01/09/2015  . Hypertension   . Non-insulin dependent type 2 diabetes mellitus (HCC)   . Essential hypertension   . Coronary artery disease involving native coronary artery of native heart  with unstable angina  pectoris [facility])    Guss Bunde, PT, DPT 01/15/2018, 3:03 PM  [facility]  [address] [place], [place], [address] Phone: [phone]   Fax:  [phone]  Name: Gary Wheeler MRN: [id number] Date of Birth: February 16, 1946

## 2018-01-20 ENCOUNTER — Encounter: Payer: Non-veteran care | Admitting: Physical Therapy

## 2018-01-22 ENCOUNTER — Ambulatory Visit: Payer: No Typology Code available for payment source | Admitting: Physical Therapy

## 2018-01-22 ENCOUNTER — Encounter: Payer: Self-pay | Admitting: Physical Therapy

## 2018-01-22 DIAGNOSIS — M25672 Stiffness of left ankle, not elsewhere classified: Secondary | ICD-10-CM

## 2018-01-22 DIAGNOSIS — M25572 Pain in left ankle and joints of left foot: Secondary | ICD-10-CM | POA: Diagnosis not present

## 2018-01-22 DIAGNOSIS — R600 Localized edema: Secondary | ICD-10-CM

## 2018-01-22 NOTE — Therapy (Signed)
[facility] [address] [place], [place], [address] Phone: [phone]   Fax:  [phone]  Physical Therapy Treatment  Patient Details  Name: Gary Wheeler MRN: [id number] Date of Birth: 04-16-1946 Referring Provider: Fritzi Mandes MD   Encounter Date: 01/22/2018  PT End of Session - 01/22/18 1508    Visit Number  18    Number of Visits  24    Date for PT Re-Evaluation  02/03/18    PT Start Time  0145    PT Stop Time  0244    PT Time Calculation (min)  59 min    Activity Tolerance  Patient tolerated treatment well    Behavior During Therapy  [facility] for tasks assessed/performed       Past Medical History:  Diagnosis Date  . CAD (coronary artery disease) 01/09/15   a. cath 01/09/15 PCI with 2 DES stents in LAD/1 in Dx with comcomitant CAD in distal LAD, RI.   . Diabetes mellitus without complication (HCC)   . Hypertension   . STEMI (ST elevation myocardial infarction) (HCC) 01/09/15    Past Surgical History:  Procedure Laterality Date  . CARDIAC CATHETERIZATION N/A 01/09/2015   Procedure: Left Heart Cath and Coronary Angiography;  Surgeon: Marykay Lex, MD;  Location: [facility] INVASIVE CV LAB;  Service: Cardiovascular;  Laterality: N/A;  . CARDIAC CATHETERIZATION  01/09/2015   Procedure: Coronary Stent Intervention;  Surgeon: Marykay Lex, MD;  Location: [facility] INVASIVE CV LAB;  Service: Cardiovascular;;  . COLONOSCOPY    . ESOPHAGOGASTRODUODENOSCOPY      There were no vitals filed for this visit.  Subjective Assessment - 01/22/18 1349    Subjective  I'm doing much better.    Currently in Pain?  Yes    Pain Score  2     Pain Location  Ankle    Pain Orientation  Left    Pain Onset  More than a month ago                       [facility] Adult PT Treatment/Exercise - 01/22/18 0001      Exercises   Exercises  Ankle      Knee/Hip Exercises: Aerobic   Stationary Bike  Level 4 x 20 minutes.      Programmer, applications Location  Left ankle    Electrical Stimulation Action  IFC    Electrical Stimulation Parameters  80-150 Hz x 20 minutes.    Electrical Stimulation Goals  Edema;Pain      Vasopneumatic   Number Minutes Vasopneumatic   20 minutes    Vasopnuematic Location   -- Left ankle.    Vasopneumatic Pressure  Medium      Manual Therapy   Soft tissue mobilization  In prone:  IASTM x 10 minutes to left calf and plantar fascia.               PT Short Term Goals - 11/11/17 1224      PT SHORT TERM GOAL #1   Title  STG's=LTG's.        PT Long Term Goals - 01/08/18 1353      PT LONG TERM GOAL #1   Title  Independent with a HEP.    Time  12    Period  Weeks    Status  On-going      PT LONG TERM GOAL #2   Title  Increase left ankle dorsiflexion to 6-  8 degrees to normalize the patient's gait pattern.    Time  12    Period  Weeks    Status  On-going      PT LONG TERM GOAL #3   Title  Increase left ankle strength to 4+ to 5/5 to increase stability for functional tasks.    Time  12    Period  Weeks    Status  On-going      PT LONG TERM GOAL #4   Title  Walk without deviation.    Baseline  Increased stance time over left LE now.    Time  12    Period  Weeks    Status  On-going      PT LONG TERM GOAL #5   Title  Perform ADL's with pain not > 3/10.    Time  12    Period  Weeks    Status  On-going            Plan - 01/22/18 1509    Clinical Impression Statement  Patient is making excellent progress toward goals.    PT Treatment/Interventions  ADLs/Self Care Home Management;Cryotherapy;Electrical Stimulation;Ultrasound;Moist Heat;Gait training;Stair training;Therapeutic activities;Therapeutic exercise;Neuromuscular re-education;Patient/family education;Passive range of motion;Manual techniques;Vasopneumatic Device    PT Next Visit Plan  Continue proprioceptive exercises modalities PRN as needed.    Consulted and Agree with Plan of  Care  Patient       Patient will benefit from skilled therapeutic intervention in order to improve the following deficits and impairments:  Abnormal gait, Decreased activity tolerance, Decreased range of motion, Increased edema, Pain  Visit Diagnosis: Pain in left ankle and joints of left foot  Stiffness of left ankle, not elsewhere classified  Localized edema     Problem List Patient Active Problem List   Diagnosis Date Noted  . Acute ST elevation myocardial infarction (STEMI) involving left anterior descending coronary artery (HCC) 01/09/2015  . ST elevation myocardial infarction (STEMI) involving other coronary artery of inferior wall (HCC) 01/09/2015  . STEMI (ST elevation myocardial infarction) (HCC) 01/09/2015  . Hypertension   . Non-insulin dependent type 2 diabetes mellitus (HCC)   . Essential hypertension   . Coronary artery disease involving native coronary artery of native heart with unstable angina pectoris (HCC)     Brentt Fread, [place] MPT 01/22/2018, 3:12 PM   [facility] [address] [place], [place], [address] Phone: [phone]   Fax:  [phone]  Name: Gary Wheeler MRN: [id number] Date of Birth: September 05, 1945

## 2018-01-26 ENCOUNTER — Ambulatory Visit: Payer: Medicare Other | Admitting: Physical Therapy

## 2018-01-28 ENCOUNTER — Encounter: Payer: Non-veteran care | Admitting: Physical Therapy

## 2018-01-30 ENCOUNTER — Telehealth: Payer: Self-pay | Admitting: General Practice

## 2018-01-30 ENCOUNTER — Encounter: Payer: Self-pay | Admitting: Internal Medicine

## 2018-01-30 NOTE — Telephone Encounter (Signed)
[facility] and the patient will need a new referral from them prior to his appt here in July.  He will need to [facility] and tell them he would like to reinitiate the referral to our practice.  She is going to call and make the patient aware of this.

## 2018-01-30 NOTE — Telephone Encounter (Signed)
I tried to call the patient and was unable to reach him  

## 2018-02-02 NOTE — Telephone Encounter (Signed)
Routing to Stacey as a FYI 

## 2018-02-03 ENCOUNTER — Ambulatory Visit: Payer: Medicare Other | Attending: Orthopedic Surgery | Admitting: Physical Therapy

## 2018-02-03 DIAGNOSIS — R600 Localized edema: Secondary | ICD-10-CM | POA: Diagnosis not present

## 2018-02-03 DIAGNOSIS — M25672 Stiffness of left ankle, not elsewhere classified: Secondary | ICD-10-CM | POA: Diagnosis not present

## 2018-02-03 DIAGNOSIS — M25572 Pain in left ankle and joints of left foot: Secondary | ICD-10-CM | POA: Diagnosis not present

## 2018-02-03 NOTE — Therapy (Addendum)
[facility] [address] [place], [place], [address] Phone: [phone]   Fax:  [phone]  Physical Therapy Treatment  Patient Details  Name: Gary Wheeler MRN: [id number] Date of Birth: 1946-07-25 Referring Provider: Fritzi MandesJustine Crowley MD   Encounter Date: 02/03/2018  PT End of Session - 02/03/18 1346    Visit Number  19    Number of Visits  24    Date for PT Re-Evaluation  03/06/18    PT Start Time  1345    PT Stop Time  1435    PT Time Calculation (min)  50 min    Activity Tolerance  Patient tolerated treatment well    Behavior During Therapy  [facility] for tasks assessed/performed       Past Medical History:  Diagnosis Date  . CAD (coronary artery disease) 01/09/15   a. cath 01/09/15 PCI with 2 DES stents in LAD/1 in Dx with comcomitant CAD in distal LAD, RI.   . Diabetes mellitus without complication (HCC)   . Hypertension   . STEMI (ST elevation myocardial infarction) (HCC) 01/09/15    Past Surgical History:  Procedure Laterality Date  . CARDIAC CATHETERIZATION N/A 01/09/2015   Procedure: Left Heart Cath and Coronary Angiography;  Surgeon: Marykay Lexavid W Harding, MD;  [facility];  Service: Cardiovascular;  Laterality: N/A;  . CARDIAC CATHETERIZATION  01/09/2015   Procedure: Coronary Stent Intervention;  Surgeon: Marykay Lexavid W Harding, MD;  [facility];  Service: Cardiovascular;;  . COLONOSCOPY    . ESOPHAGOGASTRODUODENOSCOPY      There were no vitals filed for this visit.  Subjective Assessment - 02/03/18 1347    Subjective  Patient reported doing alright but accidently dropped some tools on his left foot. but reported its fine now.     Pertinent History  DM    How long can you walk comfortably?  Short community distances.    Patient Stated Goals  Get back to normal.    Currently in Pain?  Yes    Pain Score  2     Pain Location  Ankle    Pain Orientation  Left    Pain Descriptors / Indicators  Aching     Pain Type  Surgical pain    Pain Onset  More than a month ago    Pain Frequency  Intermittent         [facility] PT Assessment - 02/03/18 0001      Assessment   Medical Diagnosis  S/p ORIF left medial malleolus.                   [facility] Adult PT Treatment/Exercise - 02/03/18 0001      Exercises   Exercises  Ankle      Knee/Hip Exercises: Aerobic   Stationary Bike  Level 4 x 15 minutes.      Programme researcher, broadcasting/film/videolectrical Stimulation   Electrical Stimulation Location  Left ankle    Electrical Stimulation Action  IFC    Electrical Stimulation Parameters  80-150 hz x15    Electrical Stimulation Goals  Edema;Pain      Ankle Exercises: Standing   Rocker Board  5 minutes    Heel Raises  Both;10 reps;20 reps heels together     Vasopneumatic device to L ankle, medium pressure x15 minutes     Balance Exercises - 02/03/18 1402      Balance Exercises: Standing   Other Standing Exercises  Inverted BOSU in parallel bars no UE support  x 5 minutes. with cognitive task          PT Short Term Goals - 11/11/17 1224      PT SHORT TERM GOAL #1   Title  STG's=LTG's.        PT Long Term Goals - 01/08/18 1353      PT LONG TERM GOAL #1   Title  Independent with a HEP.    Time  12    Period  Weeks    Status  On-going      PT LONG TERM GOAL #2   Title  Increase left ankle dorsiflexion to 6- 8 degrees to normalize the patient's gait pattern.    Time  12    Period  Weeks    Status  On-going      PT LONG TERM GOAL #3   Title  Increase left ankle strength to 4+ to 5/5 to increase stability for functional tasks.    Time  12    Period  Weeks    Status  On-going      PT LONG TERM GOAL #4   Title  Walk without deviation.    Baseline  Increased stance time over left LE now.    Time  12    Period  Weeks    Status  On-going      PT LONG TERM GOAL #5   Title  Perform ADL's with pain not > 3/10.    Time  12    Period  Weeks    Status  On-going            Plan - 02/03/18  1421    Clinical Impression Statement  Patient was able to tolerate treatment well with minimal reports of pain. Patient noted with improvements with balance as noted by ability to perform balance on bosu with intermittent UE support. Normal response to modalities upon removal. MD re-certification sent to extend date to complete remaining visits.    Clinical Presentation  Stable    Clinical Decision Making  Low    Rehab Potential  Excellent    PT Frequency  2x / week    PT Duration  12 weeks    PT Treatment/Interventions  ADLs/Self Care Home Management;Cryotherapy;Electrical Stimulation;Ultrasound;Moist Heat;Gait training;Stair training;Therapeutic activities;Therapeutic exercise;Neuromuscular re-education;Patient/family education;Passive range of motion;Manual techniques;Vasopneumatic Device    PT Next Visit Plan  Continue proprioceptive exercises modalities PRN as needed.    Consulted and Agree with Plan of Care  Patient       Patient will benefit from skilled therapeutic intervention in order to improve the following deficits and impairments:  Abnormal gait, Decreased activity tolerance, Decreased range of motion, Increased edema, Pain  Visit Diagnosis: Pain in left ankle and joints of left foot  Stiffness of left ankle, not elsewhere classified  Localized edema     Problem List Patient Active Problem List   Diagnosis Date Noted  . Acute ST elevation myocardial infarction (STEMI) involving left anterior descending coronary artery (HCC) 01/09/2015  . ST elevation myocardial infarction (STEMI) involving other coronary artery of inferior wall (HCC) 01/09/2015  . STEMI (ST elevation myocardial infarction) (HCC) 01/09/2015  . Hypertension   . Non-insulin dependent type 2 diabetes mellitus (HCC)   . Essential hypertension   . Coronary artery disease involving native coronary artery of native heart with unstable angina pectoris (HCC)     Guss Bunde, PT, DPT 02/03/2018, 6:14  PM  [facility] [address] [place], [place], [address] Phone:  [phone]   Fax:  [phone]  Name: Gary Wheeler MRN: [id number] Date of Birth: [birth year]

## 2018-02-05 ENCOUNTER — Encounter: Payer: Non-veteran care | Admitting: Physical Therapy

## 2018-02-10 ENCOUNTER — Encounter: Payer: Self-pay | Admitting: Physical Therapy

## 2018-02-10 ENCOUNTER — Ambulatory Visit: Payer: Medicare Other | Admitting: Physical Therapy

## 2018-02-10 DIAGNOSIS — M25672 Stiffness of left ankle, not elsewhere classified: Secondary | ICD-10-CM | POA: Diagnosis not present

## 2018-02-10 DIAGNOSIS — R600 Localized edema: Secondary | ICD-10-CM

## 2018-02-10 DIAGNOSIS — M25572 Pain in left ankle and joints of left foot: Secondary | ICD-10-CM

## 2018-02-10 NOTE — Therapy (Signed)
[facility] [address] [place], [place], [address] Phone: [phone]   Fax:  [phone]  Physical Therapy Treatment  Patient Details  Name: Gary Wheeler MRN: [id number] Date of Birth: 1946-04-20 Referring Provider: Fritzi MandesJustine Crowley MD   Encounter Date: 02/10/2018  PT End of Session - 02/10/18 1305    Visit Number  20    Number of Visits  24    Date for PT Re-Evaluation  03/06/18    PT Start Time  1303    PT Stop Time  1356    PT Time Calculation (min)  53 min    Activity Tolerance  Patient tolerated treatment well    Behavior During Therapy  [facility] for tasks assessed/performed       Past Medical History:  Diagnosis Date  . CAD (coronary artery disease) 01/09/15   a. cath 01/09/15 PCI with 2 DES stents in LAD/1 in Dx with comcomitant CAD in distal LAD, RI.   . Diabetes mellitus without complication (HCC)   . Hypertension   . STEMI (ST elevation myocardial infarction) (HCC) 01/09/15    Past Surgical History:  Procedure Laterality Date  . CARDIAC CATHETERIZATION N/A 01/09/2015   Procedure: Left Heart Cath and Coronary Angiography;  Surgeon: Marykay Lexavid W Harding, MD;  [facility];  Service: Cardiovascular;  Laterality: N/A;  . CARDIAC CATHETERIZATION  01/09/2015   Procedure: Coronary Stent Intervention;  Surgeon: Marykay Lexavid W Harding, MD;  [facility];  Service: Cardiovascular;;  . COLONOSCOPY    . ESOPHAGOGASTRODUODENOSCOPY      There were no vitals filed for this visit.      [facility] PT Assessment - 02/10/18 0001      Assessment   Medical Diagnosis  S/p ORIF left medial malleolus.                   [facility]/Exercise - 02/10/18 0001      Exercises   Exercises  Ankle      Knee/Hip Exercises: Aerobic   Stationary Bike  Level 4 x 15 minutes.      Programme researcher, broadcasting/film/videolectrical Stimulation   Electrical Stimulation Location  Left ankle    Electrical Stimulation Action  IFC    Electrical  Stimulation Parameters  80-150 hz x15 min    Electrical Stimulation Goals  Edema;Pain      Vasopneumatic   Number Minutes Vasopneumatic   15 minutes    Vasopnuematic Location   Ankle    Vasopneumatic Pressure  Medium      Manual Therapy   Soft tissue mobilization  In prone:  IASTM x 8 minutes to left calf to decrease trigger points          Balance Exercises - 02/10/18 1352      Balance Exercises: Standing   Other Standing Exercises  Inverted BOSU in parallel bars no UE support x 5 minutes. followed by 2x15 mini squats on inverted bosu          PT Short Term Goals - 11/11/17 1224      PT SHORT TERM GOAL #1   Title  STG's=LTG's.        PT Long Term Goals - 01/08/18 1353      PT LONG TERM GOAL #1   Title  Independent with a HEP.    Time  12    Period  Weeks    Status  On-going      PT LONG TERM GOAL #2   Title  Increase left ankle dorsiflexion to 6- 8 degrees to normalize the patient's gait pattern.    Time  12    Period  Weeks    Status  On-going      PT LONG TERM GOAL #3   Title  Increase left ankle strength to 4+ to 5/5 to increase stability for functional tasks.    Time  12    Period  Weeks    Status  On-going      PT LONG TERM GOAL #4   Title  Walk without deviation.    Baseline  Increased stance time over left LE now.    Time  12    Period  Weeks    Status  On-going      PT LONG TERM GOAL #5   Title  Perform ADL's with pain not > 3/10.    Time  12    Period  Weeks    Status  On-going            Plan - 02/10/18 1357    Clinical Impression Statement  Patient was able to tolerate treatment well with no reports of pain and no loss of balance on inverted bosu. Patient and PT discussed plan for remaining 4 sessions. 1x this week, 2x next week and 1x the following week. Emphasis of calf strengthening for remaining visits.     Clinical Presentation  Stable    Clinical Decision Making  Low    Rehab Potential  Excellent    PT Frequency  2x /  week    PT Duration  12 weeks    PT Treatment/Interventions  ADLs/Self Care Home Management;Cryotherapy;Electrical Stimulation;Ultrasound;Moist Heat;Gait training;Stair training;Therapeutic activities;Therapeutic exercise;Neuromuscular re-education;Patient/family education;Passive range of motion;Manual techniques;Vasopneumatic Device    PT Next Visit Plan  Continue proprioceptive exercises; emphasis on calf strengthening; modalities PRN as needed.    Consulted and Agree with Plan of Care  Patient       Patient will benefit from skilled therapeutic intervention in order to improve the following deficits and impairments:  Abnormal gait, Decreased activity tolerance, Decreased range of motion, Increased edema, Pain  Visit Diagnosis: Pain in left ankle and joints of left foot  Stiffness of left ankle, not elsewhere classified  Localized edema     Problem List Patient Active Problem List   Diagnosis Date Noted  . Acute ST elevation myocardial infarction (STEMI) involving left anterior descending coronary artery (HCC) 01/09/2015  . ST elevation myocardial infarction (STEMI) involving other coronary artery of inferior wall (HCC) 01/09/2015  . STEMI (ST elevation myocardial infarction) (HCC) 01/09/2015  . Hypertension   . Non-insulin dependent type 2 diabetes mellitus (HCC)   . Essential hypertension   . Coronary artery disease involving native coronary artery of native heart with unstable angina pectoris [facility])    Guss Bunde, PT, DPT 02/10/2018, 2:02 PM  [facility] [address] [place], [place], [address] Phone: [phone]   Fax:  [phone]  Name: Gary Wheeler MRN: [id number] Date of Birth: [birth year]

## 2018-02-11 ENCOUNTER — Encounter: Payer: Self-pay | Admitting: Gastroenterology

## 2018-02-11 ENCOUNTER — Other Ambulatory Visit: Payer: Self-pay | Admitting: *Deleted

## 2018-02-11 ENCOUNTER — Telehealth: Payer: Self-pay | Admitting: *Deleted

## 2018-02-11 ENCOUNTER — Ambulatory Visit (INDEPENDENT_AMBULATORY_CARE_PROVIDER_SITE_OTHER): Payer: Medicare Other | Admitting: Gastroenterology

## 2018-02-11 ENCOUNTER — Encounter: Payer: Self-pay | Admitting: *Deleted

## 2018-02-11 VITALS — BP 131/85 | HR 61 | Temp 96.7°F | Ht 70.0 in | Wt 199.2 lb

## 2018-02-11 DIAGNOSIS — K2270 Barrett's esophagus without dysplasia: Secondary | ICD-10-CM | POA: Diagnosis not present

## 2018-02-11 DIAGNOSIS — Z1211 Encounter for screening for malignant neoplasm of colon: Secondary | ICD-10-CM | POA: Diagnosis not present

## 2018-02-11 MED ORDER — PEG 3350-KCL-NA BICARB-NACL 420 G PO SOLR: 4000.0000 mL | Freq: Once | ORAL | 0 refills | Status: AC

## 2018-02-11 NOTE — Patient Instructions (Signed)
We have scheduled you for a colonoscopy and upper endoscopy with Dr. Jena Gaussourk in the near future.  Continue taking Prilosec once each morning, 30 minutes before breakfast.  Further recommendations to follow!  It was a pleasure to see you today. I strive to create trusting relationships with patients to provide genuine, compassionate, and quality care. I value your feedback. If you receive a survey regarding your visit,  I greatly appreciate you taking time to fill this out.   Gelene MinkAnna W. Sylina Henion, PhD, ANP-BC [facility]

## 2018-02-11 NOTE — Assessment & Plan Note (Signed)
72 year old male due for routine screening colonoscopy now, with last in 2008 normal by Dr. Linna DarnerAnwar. No concerning lower GI symptoms. No FH of colorectal cancer or polyps.  Proceed with TCS with Dr. Jena Gaussourk in near future: the risks, benefits, and alternatives have been discussed with the patient in detail. The patient states understanding and desires to proceed.

## 2018-02-11 NOTE — Progress Notes (Addendum)
Primary Care Physician:  Grayland Jack, PA-C Primary Gastroenterologist:  Dr. Jena Gauss   Chief Complaint  Patient presents with  . Colonoscopy  . barrett's esophagus    HPI:   Gary Wheeler is a 72 y.o. male presenting today at the request of the [place] for routine screening colonoscopy and surveillance EGD due to history of Barrett's esophagus. Colonoscopy normal in 2008 by Dr. Linna Darner. Diagnosed with Barrett's in 2008 at time of EGD. Path noted a minute fragment of squamous cell epithelium was suggestive of dysplasia.   Taking Prilosec once every day. No GERD exacerbations. No dysphagia. No overt GI bleeding. Denies any abdominal pain, N/V, change in appetite, or unexplained weight loss. No GI complaints.   Past Medical History:  Diagnosis Date  . Barrett's esophagus 2008  . CAD (coronary artery disease) 01/09/15   a. cath 01/09/15 PCI with 2 DES stents in LAD/1 in Dx with comcomitant CAD in distal LAD, RI.   . Diabetes mellitus without complication (HCC)   . Hypercholesterolemia   . Hypertension   . STEMI (ST elevation myocardial infarction) (HCC) 01/09/15    Past Surgical History:  Procedure Laterality Date  . CARDIAC CATHETERIZATION N/A 01/09/2015   Procedure: Left Heart Cath and Coronary Angiography;  Surgeon: Marykay Lex, MD;  Location: [facility] INVASIVE CV LAB;  Service: Cardiovascular;  Laterality: N/A;  . CARDIAC CATHETERIZATION  01/09/2015   with 3 stents  . COLONOSCOPY  2008   Dr. Linna Darner: normal  . ESOPHAGOGASTRODUODENOSCOPY  2008   Dr. Linna Darner: Barrett's. Path with a minute fragment of squamous cell epithelium suggestive of dysplasia  . left ORIF ankle      Current Outpatient Medications  Medication Sig Dispense Refill  . amLODipine (NORVASC) 10 MG tablet Take 5 mg by mouth daily.     Marland Kitchen aspirin EC 81 MG tablet Take 81 mg by mouth daily.    . cholecalciferol (VITAMIN D) 1000 UNITS tablet Take 4,000 Units by mouth daily.     . insulin glargine (LANTUS) 100  UNIT/ML injection Inject 24 Units into the skin daily.    . isosorbide mononitrate (IMDUR) 30 MG 24 hr tablet Take 1 tablet (30 mg total) by mouth daily. 30 tablet 3  . lisinopril-hydrochlorothiazide (PRINZIDE,ZESTORETIC) 20-12.5 MG per tablet Take 1 tablet by mouth 2 (two) times daily.     . metoprolol tartrate (LOPRESSOR) 25 MG tablet Take 12.5 mg by mouth 2 (two) times daily.    . nitroGLYCERIN (NITROSTAT) 0.4 MG SL tablet Place 1 tablet (0.4 mg total) under the tongue every 5 (five) minutes x 3 doses as needed for chest pain. 25 tablet 12  . omeprazole (PRILOSEC) 20 MG capsule Take 20 mg by mouth daily.    . rosuvastatin (CRESTOR) 5 MG tablet Take 5 mg by mouth at bedtime.     . vitamin B-12 (CYANOCOBALAMIN) 1000 MCG tablet Take 2,000 mcg by mouth daily.     . insulin aspart (NOVOLOG FLEXPEN) 100 UNIT/ML FlexPen Inject 10-20 Units into the skin 3 (three) times daily with meals. Per sliding scale    . ketotifen (ZADITOR) 0.025 % ophthalmic solution Place 1 drop into both eyes 2 (two) times daily as needed (for dry eyes).    Marland Kitchen loratadine (CLARITIN) 10 MG tablet Take 10 mg by mouth daily as needed for allergies.    . metFORMIN (GLUCOPHAGE) 500 MG tablet Take 500 mg by mouth 2 (two) times daily with a meal.    . potassium chloride (  K-DUR,KLOR-CON) 10 MEQ tablet Take 10 mEq by mouth daily as needed (for cramping).     No current facility-administered medications for this visit.     Allergies as of 02/11/2018 - Review Complete 02/11/2018  Allergen Reaction Noted  . Lipitor [atorvastatin] Diarrhea 04/14/2015  . Pravastatin Diarrhea 04/14/2015    Family History  Problem Relation Age of Onset  . Coronary artery disease Father   . Coronary artery disease Paternal Grandfather   . Prostate cancer Brother   . Colon cancer Neg Hx   . Colon polyps Neg Hx     Social History   Socioeconomic History  . Marital status: Married    Spouse name: Not on file  . Number of children: Not on file  .  Years of education: Not on file  . Highest education level: Not on file  Occupational History  . Occupation: Retired, but works 2 days/week gas station  Social Needs  . Financial resource strain: Not on file  . Food insecurity:    Worry: Not on file    Inability: Not on file  . Transportation needs:    Medical: Not on file    Non-medical: Not on file  Tobacco Use  . Smoking status: Current Some Day Smoker    Types: Cigars    Last attempt to quit: 07/30/1999    Years since quitting: 18.6  . Smokeless tobacco: Never Used  Substance and Sexual Activity  . Alcohol use: Yes    Alcohol/week: 0.0 standard drinks    Comment: rare, occasional mixed drink   . Drug use: No  . Sexual activity: Not on file  Lifestyle  . Physical activity:    Days per week: Not on file    Minutes per session: Not on file  . Stress: Not on file  Relationships  . Social connections:    Talks on phone: Not on file    Gets together: Not on file    Attends religious service: Not on file    Active member of club or organization: Not on file    Attends meetings of clubs or organizations: Not on file    Relationship status: Not on file  . Intimate partner violence:    Fear of current or ex partner: Not on file    Emotionally abused: Not on file    Physically abused: Not on file    Forced sexual activity: Not on file  Other Topics Concern  . Not on file  Social History Narrative   Lives in [place], [place] with wife.    Review of Systems: Gen: Denies any fever, chills, fatigue, weight loss, lack of appetite.  CV: Denies chest pain, heart palpitations, peripheral edema, syncope.  Resp: Denies shortness of breath at rest or with exertion. Denies wheezing or cough.  GI: see HPI  GU : Denies urinary burning, urinary frequency, urinary hesitancy MS: Denies joint pain, muscle weakness, cramps, or limitation of movement.  Derm: Denies rash, itching, dry skin Psych: Denies depression, anxiety, memory loss, and  confusion Heme: Denies bruising, bleeding, and enlarged lymph nodes.  Physical Exam: BP 131/85   Pulse 61   Temp (!) 96.7 F (35.9 C) (Oral)   Ht 5\' 10"  (1.778 m)   Wt 199 lb 3.2 oz (90.4 kg)   BMI 28.58 kg/m  General:   Alert and oriented. Pleasant and cooperative. Well-nourished and well-developed.  Head:  Normocephalic and atraumatic. Eyes:  Without icterus, sclera clear and conjunctiva pink.  Ears:  Normal auditory  acuity. Nose:  No deformity, discharge,  or lesions. Mouth:  No deformity or lesions, oral mucosa pink.  Lungs:  Clear to auscultation bilaterally. No wheezes, rales, or rhonchi. No distress.  Heart:  S1, S2 present without murmurs appreciated.  Abdomen:  +BS, soft, non-tender and non-distended. No HSM noted. No guarding or rebound. No masses appreciated.  Rectal:  Deferred  Msk:  Symmetrical without gross deformities. Normal posture. Extremities:  Without  edema. Neurologic:  Alert and  oriented x4;  grossly normal neurologically. Psych:  Alert and cooperative. Normal mood and affect.

## 2018-02-11 NOTE — Assessment & Plan Note (Signed)
Initially diagnosed in 2008. No surveillance since that time. Remains on Prilosec without any alarm features. Esophageal biopsy at that time with a minute fragment of squamous epithelium suggestive of dysplasia.   Proceed with upper endoscopy in the near future with Dr. Jena Gaussourk. The risks, benefits, and alternatives have been discussed in detail with patient. They have stated understanding and desire to proceed.  Continue Prilosec indefinitely

## 2018-02-11 NOTE — Telephone Encounter (Signed)
Per AB, diabetic instructions for TCS are as follows: 1/2 dose insulin the evening prior to tcs, no metformin/insulin day of procedure.

## 2018-02-12 ENCOUNTER — Ambulatory Visit: Payer: Medicare Other | Admitting: Physical Therapy

## 2018-02-12 DIAGNOSIS — M25572 Pain in left ankle and joints of left foot: Secondary | ICD-10-CM

## 2018-02-12 DIAGNOSIS — M25672 Stiffness of left ankle, not elsewhere classified: Secondary | ICD-10-CM | POA: Diagnosis not present

## 2018-02-12 DIAGNOSIS — R600 Localized edema: Secondary | ICD-10-CM | POA: Diagnosis not present

## 2018-02-12 NOTE — Therapy (Signed)
[facility] [address] [place], [place], [address] Phone: [phone]   Fax:  [phone]  Physical Therapy Treatment  Patient Details  Name: Gary Wheeler MRN: [id number] Date of Birth: Feb 13, 1946 Referring Provider: Fritzi Mandes MD   Encounter Date: 02/12/2018  PT End of Session - 02/12/18 1527    Visit Number  21    Number of Visits  24    Date for PT Re-Evaluation  03/06/18    PT Start Time  1301    PT Stop Time  1359    PT Time Calculation (min)  58 min    Activity Tolerance  Patient tolerated treatment well    Behavior During Therapy  [facility] for tasks assessed/performed       Past Medical History:  Diagnosis Date  . Barrett's esophagus 2008  . CAD (coronary artery disease) 01/09/15   a. cath 01/09/15 PCI with 2 DES stents in LAD/1 in Dx with comcomitant CAD in distal LAD, [place].   . Diabetes mellitus without complication (HCC)   . Hypercholesterolemia   . Hypertension   . STEMI (ST elevation myocardial infarction) (HCC) 01/09/15    Past Surgical History:  Procedure Laterality Date  . CARDIAC CATHETERIZATION N/A 01/09/2015   Procedure: Left Heart Cath and Coronary Angiography;  Surgeon: Marykay Lex, MD;  Location: [facility] INVASIVE CV LAB;  Service: Cardiovascular;  Laterality: N/A;  . CARDIAC CATHETERIZATION  01/09/2015   with 3 stents  . COLONOSCOPY  2008   Dr. Linna Darner: normal  . ESOPHAGOGASTRODUODENOSCOPY  2008   Dr. Linna Darner: Barrett's. Path with a minute fragment of squamous cell epithelium suggestive of dysplasia  . left ORIF ankle      There were no vitals filed for this visit.  Subjective Assessment - 02/12/18 1527    Subjective  Patient reported feeling more sore today but does not know why.     Pertinent History  DM    How long can you walk comfortably?  Short community distances.    Patient Stated Goals  Get back to normal.    Currently in Pain?  Yes    Pain Score  2     Pain Location  Ankle    Pain  Orientation  Left    Pain Descriptors / Indicators  Sore    Pain Type  Surgical pain    Pain Onset  More than a month ago    Pain Frequency  Intermittent         [facility] PT Assessment - 02/12/18 0001      Assessment   Medical Diagnosis  S/p ORIF left medial malleolus.                   [facility] Adult PT Treatment/Exercise - 02/12/18 0001      Exercises   Exercises  Ankle      Knee/Hip Exercises: Aerobic   Stationary Bike  Level 4 x 15 minutes.      Programme researcher, broadcasting/film/video Location  Left ankle    Electrical Stimulation Action  IFC    Electrical Stimulation Parameters  80-150 hz x15 min    Electrical Stimulation Goals  Edema;Pain      Vasopneumatic   Number Minutes Vasopneumatic   15 minutes    Vasopnuematic Location   Ankle    Vasopneumatic Pressure  Medium      Ankle Exercises: Standing   Heel Raises  Both;10 reps;20 reps 3D  Balance Exercises - 02/12/18 1539      Balance Exercises: Standing   Standing Eyes Closed  Narrow base of support (BOS);Solid surface;1 rep;Time 1 min    SLS  Eyes open;Solid surface;Foam/compliant surface;Upper extremity support 1;2 reps;30 secs    Other Standing Exercises  Inverted BOSU in parallel bars no UE support x 4 minutes 1 min EC,  followed by 2x15 mini squats on inverted bosu          PT Short Term Goals - 11/11/17 1224      PT SHORT TERM GOAL #1   Title  STG's=LTG's.        PT Long Term Goals - 02/12/18 1525      PT LONG TERM GOAL #1   Title  Independent with a HEP.    Time  12    Period  Weeks    Status  Achieved      PT LONG TERM GOAL #2   Title  Increase left ankle dorsiflexion to 6- 8 degrees to normalize the patient's gait pattern.    Time  12    Period  Weeks    Status  Achieved 6 degrees knee extended      PT LONG TERM GOAL #3   Title  Increase left ankle strength to 4+ to 5/5 to increase stability for functional tasks.    Time  12    Period  Weeks    Status   On-going      PT LONG TERM GOAL #4   Title  Walk without deviation.    Baseline  Increased stance time over left LE now.    Time  12    Period  Weeks    Status  On-going slight decrease of left stance time      PT LONG TERM GOAL #5   Title  Perform ADL's with pain not > 3/10.    Time  12    Period  Weeks    Status  Achieved No pain with ADLs            Plan - 02/12/18 1528    Clinical Impression Statement  Patient was able to tolerate treatment well. Patient noted with great improvements with inverted bosu balance. Attempted 1 minute of EC on inverted bosu and required intermittent UE support to maintain balance. Patient able to perform narrow base of support EC with minimal AP sway and distant supervision. Patient educated to perform 3D heel raises to improve calf strength. Patient reported understanding. Normal response to modalities upon removal.     Clinical Presentation  Stable    Clinical Decision Making  Low    Rehab Potential  Excellent    PT Frequency  2x / week    PT Duration  12 weeks    PT Treatment/Interventions  ADLs/Self Care Home Management;Cryotherapy;Electrical Stimulation;Ultrasound;Moist Heat;Gait training;Stair training;Therapeutic activities;Therapeutic exercise;Neuromuscular re-education;Patient/family education;Passive range of motion;Manual techniques;Vasopneumatic Device    PT Next Visit Plan  Continue proprioceptive exercises; emphasis on calf strengthening; modalities PRN as needed.    Consulted and Agree with Plan of Care  Patient       Patient will benefit from skilled therapeutic intervention in order to improve the following deficits and impairments:  Abnormal gait, Decreased activity tolerance, Decreased range of motion, Increased edema, Pain  Visit Diagnosis: Pain in left ankle and joints of left foot  Stiffness of left ankle, not elsewhere classified  Localized edema     Problem List Patient Active Problem List  Diagnosis Date Noted   . Encounter for screening colonoscopy 02/11/2018  . Barrett's esophagus 02/11/2018  . Acute ST elevation myocardial infarction (STEMI) involving left anterior descending coronary artery (HCC) 01/09/2015  . ST elevation myocardial infarction (STEMI) involving other coronary artery of inferior wall (HCC) 01/09/2015  . STEMI (ST elevation myocardial infarction) (HCC) 01/09/2015  . Hypertension   . Non-insulin dependent type 2 diabetes mellitus (HCC)   . Essential hypertension   . Coronary artery disease involving native coronary artery of native heart with unstable angina pectoris (HCC)     Guss BundeKrystle Ree Alcalde, PT, DPT 02/12/2018, 3:40 PM  [facility] [address] [place], [place], [address] Phone: [phone]   Fax:  [phone]  Name: Sherril CroonFairley Bagot MRN: [id number] Date of Birth: July 22, 1946

## 2018-02-12 NOTE — Progress Notes (Signed)
cc'ed to pcp °

## 2018-02-17 ENCOUNTER — Ambulatory Visit: Payer: Medicare Other | Admitting: Physical Therapy

## 2018-02-17 DIAGNOSIS — R600 Localized edema: Secondary | ICD-10-CM | POA: Diagnosis not present

## 2018-02-17 DIAGNOSIS — M25572 Pain in left ankle and joints of left foot: Secondary | ICD-10-CM

## 2018-02-17 DIAGNOSIS — M25672 Stiffness of left ankle, not elsewhere classified: Secondary | ICD-10-CM

## 2018-02-17 NOTE — Therapy (Signed)
[facility] [address] [place], [place], [address] Phone: [phone]   Fax:  [phone]  Physical Therapy Treatment  Patient Details  Name: Gary CroonFairley Croghan MRN: [id number] Date of Birth: 08-15-1945 Referring Provider: Fritzi MandesJustine Crowley MD   Encounter Date: 02/17/2018  PT End of Session - 02/17/18 1304    Visit Number  22    Number of Visits  24    Date for PT Re-Evaluation  03/06/18    PT Start Time  1300    PT Stop Time  1402    PT Time Calculation (min)  62 min    Activity Tolerance  Patient tolerated treatment well    Behavior During Therapy  [facility] for tasks assessed/performed       Past Medical History:  Diagnosis Date  . Barrett's esophagus 2008  . CAD (coronary artery disease) 01/09/15   a. cath 01/09/15 PCI with 2 DES stents in LAD/1 in Dx with comcomitant CAD in distal LAD, [place].   . Diabetes mellitus without complication (HCC)   . Hypercholesterolemia   . Hypertension   . STEMI (ST elevation myocardial infarction) (HCC) 01/09/15    Past Surgical History:  Procedure Laterality Date  . CARDIAC CATHETERIZATION N/A 01/09/2015   Procedure: Left Heart Cath and Coronary Angiography;  Surgeon: Marykay Lexavid W Harding, MD;  [facility];  Service: Cardiovascular;  Laterality: N/A;  . CARDIAC CATHETERIZATION  01/09/2015   with 3 stents  . COLONOSCOPY  2008   Dr. Linna DarnerAnwar: normal  . ESOPHAGOGASTRODUODENOSCOPY  2008   Dr. Linna DarnerAnwar: Barrett's. Path with a minute fragment of squamous cell epithelium suggestive of dysplasia  . left ORIF ankle      There were no vitals filed for this visit.  Subjective Assessment - 02/17/18 1309    Subjective  Patient reports 2/10 pain in left ankle.    Pertinent History  DM    How long can you walk comfortably?  Short community distances.    Patient Stated Goals  Get back to normal.    Currently in Pain?  Yes    Pain Score  2     Pain Location  Ankle    Pain Orientation  Left    Pain  Descriptors / Indicators  Sore    Pain Type  Surgical pain    Pain Onset  More than a month ago    Pain Frequency  Intermittent         [facility] PT Assessment - 02/17/18 0001      Assessment   Medical Diagnosis  S/p ORIF left medial malleolus.                   [facility] Adult PT Treatment/Exercise - 02/17/18 0001      Exercises   Exercises  Ankle      Knee/Hip Exercises: Aerobic   Nustep  Level 5-6, x15 min      Knee/Hip Exercises: Machines for Strengthening   Cybex Leg Press  1 plate [id number] heel raises      Electrical Stimulation   Electrical Stimulation Location  Left ankle    Electrical Stimulation Action  IFC    Electrical Stimulation Parameters  80-150 hz x15 min    Electrical Stimulation Goals  Edema;Pain      Vasopneumatic   Number Minutes Vasopneumatic   15 minutes    Vasopnuematic Location   Ankle    Vasopneumatic Pressure  Medium      Ankle Exercises: Standing  Other Standing Ankle Exercises  lateral stepping with green tb at feet 1x in tiled hall way          Balance Exercises - 02/17/18 1306      Balance Exercises: Standing   SLS  Eyes open;Solid surface;Foam/compliant surface;Upper extremity support 1;2 reps;Time 2x1 min on airex    Balance Beam  tandem walking intermittent UE support x2 minutes    Other Standing Exercises  narrow base of support Inverted BOSU in parallel bars no UE support x 5 minutes          PT Short Term Goals - 11/11/17 1224      PT SHORT TERM GOAL #1   Title  STG's=LTG's.        PT Long Term Goals - 02/12/18 1525      PT LONG TERM GOAL #1   Title  Independent with a HEP.    Time  12    Period  Weeks    Status  Achieved      PT LONG TERM GOAL #2   Title  Increase left ankle dorsiflexion to 6- 8 degrees to normalize the patient's gait pattern.    Time  12    Period  Weeks    Status  Achieved 6 degrees knee extended      PT LONG TERM GOAL #3   Title  Increase left ankle strength to 4+ to 5/5 to  increase stability for functional tasks.    Time  12    Period  Weeks    Status  On-going      PT LONG TERM GOAL #4   Title  Walk without deviation.    Baseline  Increased stance time over left LE now.    Time  12    Period  Weeks    Status  On-going slight decrease of left stance time      PT LONG TERM GOAL #5   Title  Perform ADL's with pain not > 3/10.    Time  12    Period  Weeks    Status  Achieved No pain with ADLs            Plan - 02/17/18 1311    Clinical Impression Statement  Patient was able to tolerate treatment well but reported level 6 on the nustep was a little "too much." Nustep to level 5 with no reports of pain. Patient was able to tolerate progression of exercises with minimal pain. Patient and PT discussed gym program after discharge. Patient reported understanding. Normal response to modalities upon removal.     Clinical Presentation  Stable    Clinical Decision Making  Low    Rehab Potential  Excellent    PT Frequency  2x / week    PT Duration  12 weeks    PT Treatment/Interventions  ADLs/Self Care Home Management;Cryotherapy;Electrical Stimulation;Ultrasound;Moist Heat;Gait training;Stair training;Therapeutic activities;Therapeutic exercise;Neuromuscular re-education;Patient/family education;Passive range of motion;Manual techniques;Vasopneumatic Device    PT Next Visit Plan  Continue proprioceptive exercises; emphasis on calf strengthening; modalities PRN as needed.    Consulted and Agree with Plan of Care  Patient       Patient will benefit from skilled therapeutic intervention in order to improve the following deficits and impairments:  Abnormal gait, Decreased activity tolerance, Decreased range of motion, Increased edema, Pain  Visit Diagnosis: Pain in left ankle and joints of left foot  Stiffness of left ankle, not elsewhere classified  Localized edema     Problem List  Patient Active Problem List   Diagnosis Date Noted  . Encounter for  screening colonoscopy 02/11/2018  . Barrett's esophagus 02/11/2018  . Acute ST elevation myocardial infarction (STEMI) involving left anterior descending coronary artery (HCC) 01/09/2015  . ST elevation myocardial infarction (STEMI) involving other coronary artery of inferior wall (HCC) 01/09/2015  . STEMI (ST elevation myocardial infarction) (HCC) 01/09/2015  . Hypertension   . Non-insulin dependent type 2 diabetes mellitus (HCC)   . Essential hypertension   . Coronary artery disease involving native coronary artery of native heart with unstable angina pectoris (HCC)    Guss Bunde, PT, DPT 02/17/2018, 2:11 PM  [facility] [address] [place], [place], [address] Phone: [phone]   Fax:  [phone]  Name: Keatin Benham MRN: [id number] Date of Birth: May 28, 1946

## 2018-02-19 ENCOUNTER — Ambulatory Visit: Payer: Medicare Other | Admitting: Physical Therapy

## 2018-02-19 ENCOUNTER — Encounter: Payer: Self-pay | Admitting: Physical Therapy

## 2018-02-19 DIAGNOSIS — M25672 Stiffness of left ankle, not elsewhere classified: Secondary | ICD-10-CM | POA: Diagnosis not present

## 2018-02-19 DIAGNOSIS — R600 Localized edema: Secondary | ICD-10-CM | POA: Diagnosis not present

## 2018-02-19 DIAGNOSIS — M25572 Pain in left ankle and joints of left foot: Secondary | ICD-10-CM | POA: Diagnosis not present

## 2018-02-19 NOTE — Therapy (Signed)
[facility] [address] [place], [place], [address] Phone: [phone]   Fax:  [phone]  Physical Therapy Treatment  Patient Details  Name: Gary Wheeler MRN: [id number] Date of Birth: 04/14/1946 Referring Provider: Fritzi MandesJustine Crowley MD   Encounter Date: 02/19/2018  PT End of Session - 02/19/18 1416    Visit Number  23    Number of Visits  24    Date for PT Re-Evaluation  03/06/18    PT Start Time  0100    PT Stop Time  0150    PT Time Calculation (min)  50 min       Past Medical History:  Diagnosis Date  . Barrett's esophagus 2008  . CAD (coronary artery disease) 01/09/15   a. cath 01/09/15 PCI with 2 DES stents in LAD/1 in Dx with comcomitant CAD in distal LAD, [place].   . Diabetes mellitus without complication (HCC)   . Hypercholesterolemia   . Hypertension   . STEMI (ST elevation myocardial infarction) (HCC) 01/09/15    Past Surgical History:  Procedure Laterality Date  . CARDIAC CATHETERIZATION N/A 01/09/2015   Procedure: Left Heart Cath and Coronary Angiography;  Surgeon: Marykay Lexavid W Harding, MD;  [facility];  Service: Cardiovascular;  Laterality: N/A;  . CARDIAC CATHETERIZATION  01/09/2015   with 3 stents  . COLONOSCOPY  2008   Dr. Linna DarnerAnwar: normal  . ESOPHAGOGASTRODUODENOSCOPY  2008   Dr. Linna DarnerAnwar: Barrett's. Path with a minute fragment of squamous cell epithelium suggestive of dysplasia  . left ORIF ankle      There were no vitals filed for this visit.  Subjective Assessment - 02/19/18 1340    Subjective  That band exercise bothered my back.  Don't want to do that again.    Currently in Pain?  Yes    Pain Score  2     Pain Location  Ankle    Pain Orientation  Left    Pain Onset  More than a month ago                       [facility]ercise - 02/19/18 0001      Exercises   Exercises  Knee/Hip;Ankle      Knee/Hip Exercises: Aerobic   Stationary Bike  Level 4 x 20  minutes.      Knee/Hip Exercises: Machines for Strengthening   Cybex Leg Press  2 plate leg press x 2 minutes f/b heel raises with 2 plates.      Modalities   Modalities  Estate agentlectrical Stimulation;Vasopneumatic      Electrical Stimulation   Electrical Stimulation Location  Left ankle.    Electrical Stimulation Action  IFC    Electrical Stimulation Parameters  80-150 hz x 20 minutes.    Electrical Stimulation Goals  Edema;Pain      Vasopneumatic   Number Minutes Vasopneumatic   -- 20    Vasopnuematic Location   -- Left ankle.    Vasopneumatic Pressure  Medium               PT Short Term Goals - 11/11/17 1224      PT SHORT TERM GOAL #1   Title  STG's=LTG's.        PT Long Term Goals - 02/12/18 1525      PT LONG TERM GOAL #1   Title  Independent with a HEP.    Time  12    Period  Weeks  Status  Achieved      PT LONG TERM GOAL #2   Title  Increase left ankle dorsiflexion to 6- 8 degrees to normalize the patient's gait pattern.    Time  12    Period  Weeks    Status  Achieved 6 degrees knee extended      PT LONG TERM GOAL #3   Title  Increase left ankle strength to 4+ to 5/5 to increase stability for functional tasks.    Time  12    Period  Weeks    Status  On-going      PT LONG TERM GOAL #4   Title  Walk without deviation.    Baseline  Increased stance time over left LE now.    Time  12    Period  Weeks    Status  On-going slight decrease of left stance time      PT LONG TERM GOAL #5   Title  Perform ADL's with pain not > 3/10.    Time  12    Period  Weeks    Status  Achieved No pain with ADLs            Plan - 02/19/18 1416    Clinical Impression Statement  Excellent job today with increased resistance on leg press machine.  No pain reported after treatment.    PT Treatment/Interventions  ADLs/Self Care Home Management;Cryotherapy;Electrical Stimulation;Ultrasound;Moist Heat;Gait training;Stair training;Therapeutic activities;Therapeutic  exercise;Neuromuscular re-education;Patient/family education;Passive range of motion;Manual techniques;Vasopneumatic Device    PT Next Visit Plan  Continue proprioceptive exercises; emphasis on calf strengthening; modalities PRN as needed.    Consulted and Agree with Plan of Care  Patient       Patient will benefit from skilled therapeutic intervention in order to improve the following deficits and impairments:  Abnormal gait, Decreased activity tolerance, Decreased range of motion, Increased edema, Pain  Visit Diagnosis: Pain in left ankle and joints of left foot  Stiffness of left ankle, not elsewhere classified  Localized edema     Problem List Patient Active Problem List   Diagnosis Date Noted  . Encounter for screening colonoscopy 02/11/2018  . Barrett's esophagus 02/11/2018  . Acute ST elevation myocardial infarction (STEMI) involving left anterior descending coronary artery (HCC) 01/09/2015  . ST elevation myocardial infarction (STEMI) involving other coronary artery of inferior wall (HCC) 01/09/2015  . STEMI (ST elevation myocardial infarction) (HCC) 01/09/2015  . Hypertension   . Non-insulin dependent type 2 diabetes mellitus (HCC)   . Essential hypertension   . Coronary artery disease involving native coronary artery of native heart with unstable angina pectoris (HCC)     Gary Wheeler, [place] MPT 02/19/2018, 2:17 PM  [facility] [address] [place], [place], [address] Phone: [phone]   Fax:  [phone]  Name: Gary Wheeler MRN: [id number] Date of Birth: 1946/03/01

## 2018-02-23 ENCOUNTER — Ambulatory Visit: Payer: Medicare Other | Admitting: Physical Therapy

## 2018-02-23 DIAGNOSIS — M25672 Stiffness of left ankle, not elsewhere classified: Secondary | ICD-10-CM | POA: Diagnosis not present

## 2018-02-23 DIAGNOSIS — M25572 Pain in left ankle and joints of left foot: Secondary | ICD-10-CM | POA: Diagnosis not present

## 2018-02-23 DIAGNOSIS — R600 Localized edema: Secondary | ICD-10-CM | POA: Diagnosis not present

## 2018-02-23 NOTE — Therapy (Signed)
[facility] [place], [place], [address] Phone: [phone]   Fax:  [phone]  Physical Therapy Treatment/Discharge  Patient Details  Name: Gary Wheeler MRN: [id number] Date of Birth: 12-Feb-1946 Referring Provider: Wynona Meals MD   Encounter Date: 02/23/2018  PT End of Session - 02/23/18 1315    Visit Number  24    Number of Visits  24    Date for PT Re-Evaluation  03/06/18    PT Start Time  1300    PT Stop Time  1358    PT Time Calculation (min)  58 min    Activity Tolerance  Patient tolerated treatment well    Behavior During Therapy  [facility] for tasks assessed/performed       Past Medical History:  Diagnosis Date  . Barrett's esophagus 2008  . CAD (coronary artery disease) 01/09/15   a. cath 01/09/15 PCI with 2 DES stents in LAD/1 in Dx with comcomitant CAD in distal LAD, [place].   . Diabetes mellitus without complication ([place])   . Hypercholesterolemia   . Hypertension   . STEMI (ST elevation myocardial infarction) ([place]) 01/09/15    Past Surgical History:  Procedure Laterality Date  . CARDIAC CATHETERIZATION N/A 01/09/2015   Procedure: Left Heart Cath and Coronary Angiography;  Surgeon: Leonie Man, MD;  Location: [facility] CV LAB;  Service: Cardiovascular;  Laterality: N/A;  . CARDIAC CATHETERIZATION  01/09/2015   with 3 stents  . COLONOSCOPY  2008   Dr. Rowe Pavy: normal  . ESOPHAGOGASTRODUODENOSCOPY  2008   Dr. Rowe Pavy: Barrett's. Path with a minute fragment of squamous cell epithelium suggestive of dysplasia  . left ORIF ankle      There were no vitals filed for this visit.  Subjective Assessment - 02/23/18 1311    Subjective  Patient reported no pain at the moment.    Pertinent History  DM    How long can you walk comfortably?  Short community distances.    Patient Stated Goals  Get back to normal.    Currently in Pain?  No/denies         [facility] PT Assessment - 02/23/18 0001      Assessment   Medical Diagnosis  S/p ORIF left medial malleolus.      ROM / Strength   AROM / PROM / Strength  Strength      Strength   Strength Assessment Site  Ankle    Right/Left Ankle  Left                   [facility] Adult PT Treatment/Exercise - 02/23/18 0001      Exercises   Exercises  Knee/Hip;Ankle      Knee/Hip Exercises: Aerobic   Stationary Bike  Level 4 x 15 minutes.      Knee/Hip Exercises: Machines for Strengthening   Cybex Leg Press  2 plate leg press x 2 minutes f/b heel raises with 2 plates.      Knee/Hip Exercises: Standing   Rocker Board  5 minutes      Modalities   Modalities  Psychologist, educational Location  Left ankle    Electrical Stimulation Action  IFC    Electrical Stimulation Parameters  80-150 hz x15 min    Electrical Stimulation Goals  Edema;Pain      Vasopneumatic   Number Minutes Vasopneumatic   15 minutes    Vasopnuematic Location   Ankle  Vasopneumatic Pressure  Medium    Vasopneumatic Temperature   36               PT Short Term Goals - 11/11/17 1224      PT SHORT TERM GOAL #1   Title  STG's=LTG's.        PT Long Term Goals - 02/23/18 1818      PT LONG TERM GOAL #1   Title  Independent with a HEP.    Time  12    Period  Weeks    Status  Achieved      PT LONG TERM GOAL #2   Title  Increase left ankle dorsiflexion to 6- 8 degrees to normalize the patient's gait pattern.    Time  12    Period  Weeks    Status  Achieved      PT LONG TERM GOAL #3   Title  Increase left ankle strength to 4+ to 5/5 to increase stability for functional tasks.    Time  12    Period  Weeks    Status  Achieved      PT LONG TERM GOAL #4   Title  Walk without deviation.    Time  12    Period  Weeks    Status  Achieved      PT LONG TERM GOAL #5   Title  Perform ADL's with pain not > 3/10.    Time  12    Period  Weeks    Status  Achieved            Plan -  02/23/18 1817    Clinical Impression Statement  Patient was able to tolerate treatment well with no reports of increased pain. Patient has met all goals at this time. Patient and PT discussed the gym program to maintain gains of therapy. Patient reported understanding. Normal response to modalities at end of session.     Clinical Presentation  Stable    Clinical Decision Making  Low    Rehab Potential  Excellent    PT Frequency  2x / week    PT Duration  12 weeks    PT Treatment/Interventions  ADLs/Self Care Home Management;Cryotherapy;Electrical Stimulation;Ultrasound;Moist Heat;Gait training;Stair training;Therapeutic activities;Therapeutic exercise;Neuromuscular re-education;Patient/family education;Passive range of motion;Manual techniques;Vasopneumatic Device    PT Next Visit Plan  Continue proprioceptive exercises; emphasis on calf strengthening; modalities PRN as needed.    Consulted and Agree with Plan of Care  Patient       Patient will benefit from skilled therapeutic intervention in order to improve the following deficits and impairments:  Abnormal gait, Decreased activity tolerance, Decreased range of motion, Increased edema, Pain  Visit Diagnosis: Pain in left ankle and joints of left foot  Stiffness of left ankle, not elsewhere classified  Localized edema     Problem List Patient Active Problem List   Diagnosis Date Noted  . Encounter for screening colonoscopy 02/11/2018  . Barrett's esophagus 02/11/2018  . Acute ST elevation myocardial infarction (STEMI) involving left anterior descending coronary artery ([place]) 01/09/2015  . ST elevation myocardial infarction (STEMI) involving other coronary artery of inferior wall ([place]) 01/09/2015  . STEMI (ST elevation myocardial infarction) ([place]) 01/09/2015  . Hypertension   . Non-insulin dependent type 2 diabetes mellitus ([place])   . Essential hypertension   . Coronary artery disease involving native coronary artery of native heart  with unstable angina pectoris ([place])    PHYSICAL THERAPY DISCHARGE SUMMARY  Visits from Start of  Care:  24  Current functional level related to goals / functional outcomes: See above   Remaining deficits: All goals met   Education / Equipment: HEP Plan: Patient agrees to discharge.  Patient goals were met. Patient is being discharged due to meeting the stated rehab goals.  ?????      Gabriela Eves, PT, DPT 02/23/2018, 6:30 PM  [facility] [place], [place], [address] Phone: [phone]   Fax:  [phone]  Name: Gary Wheeler MRN: [id number] Date of Birth: Jun 22, 1946

## 2018-03-18 ENCOUNTER — Other Ambulatory Visit: Payer: Self-pay

## 2018-03-18 ENCOUNTER — Encounter (HOSPITAL_COMMUNITY): Payer: Self-pay | Admitting: *Deleted

## 2018-03-18 ENCOUNTER — Ambulatory Visit (HOSPITAL_COMMUNITY)
Admission: RE | Admit: 2018-03-18 | Discharge: 2018-03-18 | Disposition: A | Discharge status: Home / Self Care | Payer: No Typology Code available for payment source | Source: Ambulatory Visit | Attending: Internal Medicine | Admitting: Internal Medicine

## 2018-03-18 ENCOUNTER — Encounter (HOSPITAL_COMMUNITY)
Admission: RE | Disposition: A | Discharge status: Home / Self Care | Payer: Self-pay | Source: Ambulatory Visit | Attending: Internal Medicine

## 2018-03-18 DIAGNOSIS — Z955 Presence of coronary angioplasty implant and graft: Secondary | ICD-10-CM | POA: Diagnosis not present

## 2018-03-18 DIAGNOSIS — K449 Diaphragmatic hernia without obstruction or gangrene: Secondary | ICD-10-CM | POA: Insufficient documentation

## 2018-03-18 DIAGNOSIS — I252 Old myocardial infarction: Secondary | ICD-10-CM | POA: Insufficient documentation

## 2018-03-18 DIAGNOSIS — F17290 Nicotine dependence, other tobacco product, uncomplicated: Secondary | ICD-10-CM | POA: Insufficient documentation

## 2018-03-18 DIAGNOSIS — Z794 Long term (current) use of insulin: Secondary | ICD-10-CM | POA: Diagnosis not present

## 2018-03-18 DIAGNOSIS — K5730 Diverticulosis of large intestine without perforation or abscess without bleeding: Secondary | ICD-10-CM | POA: Insufficient documentation

## 2018-03-18 DIAGNOSIS — Z1212 Encounter for screening for malignant neoplasm of rectum: Secondary | ICD-10-CM | POA: Diagnosis not present

## 2018-03-18 DIAGNOSIS — Z888 Allergy status to other drugs, medicaments and biological substances status: Secondary | ICD-10-CM | POA: Insufficient documentation

## 2018-03-18 DIAGNOSIS — Z8249 Family history of ischemic heart disease and other diseases of the circulatory system: Secondary | ICD-10-CM | POA: Insufficient documentation

## 2018-03-18 DIAGNOSIS — Z7982 Long term (current) use of aspirin: Secondary | ICD-10-CM | POA: Diagnosis not present

## 2018-03-18 DIAGNOSIS — Z1211 Encounter for screening for malignant neoplasm of colon: Secondary | ICD-10-CM | POA: Diagnosis not present

## 2018-03-18 DIAGNOSIS — I2510 Atherosclerotic heart disease of native coronary artery without angina pectoris: Secondary | ICD-10-CM | POA: Insufficient documentation

## 2018-03-18 DIAGNOSIS — K2270 Barrett's esophagus without dysplasia: Secondary | ICD-10-CM | POA: Insufficient documentation

## 2018-03-18 DIAGNOSIS — E7800 Pure hypercholesterolemia, unspecified: Secondary | ICD-10-CM | POA: Insufficient documentation

## 2018-03-18 DIAGNOSIS — E119 Type 2 diabetes mellitus without complications: Secondary | ICD-10-CM | POA: Insufficient documentation

## 2018-03-18 DIAGNOSIS — Z79899 Other long term (current) drug therapy: Secondary | ICD-10-CM | POA: Insufficient documentation

## 2018-03-18 DIAGNOSIS — I10 Essential (primary) hypertension: Secondary | ICD-10-CM | POA: Insufficient documentation

## 2018-03-18 HISTORY — PX: ESOPHAGOGASTRODUODENOSCOPY: SHX5428

## 2018-03-18 HISTORY — PX: COLONOSCOPY: SHX5424

## 2018-03-18 HISTORY — PX: BIOPSY: SHX5522

## 2018-03-18 LAB — GLUCOSE, CAPILLARY: GLUCOSE-CAPILLARY: 140 mg/dL — AB (ref 70–99)

## 2018-03-18 SURGERY — COLONOSCOPY
Anesthesia: Moderate Sedation

## 2018-03-18 MED ORDER — MIDAZOLAM HCL 5 MG/5ML IJ SOLN
INTRAMUSCULAR | Status: DC | PRN
  Administered 2018-03-18: 2 mg via INTRAVENOUS
  Administered 2018-03-18 (×4): 1 mg via INTRAVENOUS

## 2018-03-18 MED ORDER — MIDAZOLAM HCL 5 MG/5ML IJ SOLN
INTRAMUSCULAR | Status: AC
  Filled 2018-03-18: qty 5

## 2018-03-18 MED ORDER — MEPERIDINE HCL 100 MG/ML IJ SOLN
INTRAMUSCULAR | Status: DC | PRN
  Administered 2018-03-18 (×2): 25 mg via INTRAVENOUS

## 2018-03-18 MED ORDER — LIDOCAINE VISCOUS HCL 2 % MT SOLN
OROMUCOSAL | Status: DC | PRN
  Administered 2018-03-18: 1 via OROMUCOSAL

## 2018-03-18 MED ORDER — LIDOCAINE VISCOUS HCL 2 % MT SOLN
OROMUCOSAL | Status: AC
  Filled 2018-03-18: qty 15

## 2018-03-18 MED ORDER — ONDANSETRON HCL 4 MG/2ML IJ SOLN
INTRAMUSCULAR | Status: AC
  Filled 2018-03-18: qty 2

## 2018-03-18 MED ORDER — STERILE WATER FOR IRRIGATION IR SOLN
Status: DC | PRN
  Administered 2018-03-18: 1.5 mL

## 2018-03-18 MED ORDER — ONDANSETRON HCL 4 MG/2ML IJ SOLN
INTRAMUSCULAR | Status: DC | PRN
  Administered 2018-03-18: 4 mg via INTRAVENOUS

## 2018-03-18 MED ORDER — SODIUM CHLORIDE 0.9 % IV SOLN
INTRAVENOUS | Status: DC
  Administered 2018-03-18: 1000 mL via INTRAVENOUS

## 2018-03-18 MED ORDER — MEPERIDINE HCL 50 MG/ML IJ SOLN
INTRAMUSCULAR | Status: DC
Start: 2018-03-18 — End: 2018-03-18
  Filled 2018-03-18: qty 1

## 2018-03-18 NOTE — Op Note (Signed)
[facility] Patient Name: Gary Wheeler Procedure Date: 03/18/2018 12:36 PM MRN: [id number] Date of Birth: 1946/07/02 Attending MD: Gennette Pacobert Michael Chanci Ojala , MD CSN: [id number] Age: [age] Admit Type: Inpatient Procedure:                Upper GI endoscopy Indications:              Surveillance for malignancy due to personal history                            of Barrett's esophagus Providers:                Gennette Pacobert Michael Alonnah Lampkins, MD, Nena PolioLisa Moore, RN, Dyann Ruddleonya                            Wilson Referring MD:              Medicines:                Midazolam 4 mg IV, Meperidine 25 mg IV Complications:            No immediate complications. Estimated Blood Loss:     Estimated blood loss was minimal. Procedure:                Pre-Anesthesia Assessment:                           - Prior to the procedure, a History and Physical                            was performed, and patient medications and                            allergies were reviewed. The patient's tolerance of                            previous anesthesia was also reviewed. The risks                            and benefits of the procedure and the sedation                            options and risks were discussed with the patient.                            All questions were answered, and informed consent                            was obtained. Prior Anticoagulants: The patient has                            taken no previous anticoagulant or antiplatelet                            agents. ASA Grade Assessment: II - A patient with  mild systemic disease. After reviewing the risks                            and benefits, the patient was deemed in                            satisfactory condition to undergo the procedure.                           After obtaining informed consent, the endoscope was                            passed under direct vision. Throughout the                            procedure,  the patient's blood pressure, pulse, and                            oxygen saturations were monitored continuously. The                            [id number]-[id number] ([id number]) scope was introduced through the                            mouth, and advanced to the second part of duodenum.                            The upper GI endoscopy was accomplished without                            difficulty. The patient tolerated the procedure                            well. Scope In: 1:05:44 PM Scope Out: 1:12:04 PM Total Procedure Duration: 0 hours 6 minutes 20 seconds  Findings:      Salmon-colored mucosa was present. undulating "tongues" of       salmon-colored epithelium extending up from the GE junction (35 cm) to       32 cm. No esophagitis. No nodularity. Circumferential biopsies at 34 and       32 cm taken for histologic study.      A small hiatal hernia was present. Minimally polypoid gastric mucosa. No       ulcer or infiltrating process.      The duodenal bulb and second portion of the duodenum were normal. Impression:               - Salmon-colored mucosa - highly suspicious for                            Barrett's esophagus?"status post biopsy..                           - Small hiatal hernia. minimal polypoid gastric  mucosa.                           - Normal duodenal bulb and second portion of the                            duodenum.                           - No specimens collected. Moderate Sedation:      Moderate (conscious) sedation was administered by the endoscopy nurse       and supervised by the endoscopist. The following parameters were       monitored: oxygen saturation, heart rate, blood pressure, respiratory       rate, EKG, adequacy of pulmonary ventilation, and response to care.       Total physician intraservice time was 16 minutes. Recommendation:           - Patient has a contact number available for                            emergencies.  The signs and symptoms of potential                            delayed complications were discussed with the                            patient. Return to normal activities tomorrow.                            Written discharge instructions were provided to the                            patient.                           - Advance diet as tolerated.                           - Continue present medications.                           - Repeat upper endoscopy after studies are complete                            for surveillance based on pathology results.                           - Return to GI clinic in 3 months. See colonoscopy                            report. Procedure Code(s):        --- Professional ---                           [id number], Esophagogastroduodenoscopy, flexible,  transoral; diagnostic, including collection of                            specimen(s) by brushing or washing, when performed                            (separate procedure)                           G0500, Moderate sedation services provided by the                            same physician or other qualified health care                            professional performing a gastrointestinal                            endoscopic service that sedation supports,                            requiring the presence of an independent trained                            observer to assist in the monitoring of the                            patient's level of consciousness and physiological                            status; initial 15 minutes of intra-service time;                            patient age 15 years or older (additional time may                            be reported with 40981, as appropriate) Diagnosis Code(s):        --- Professional ---                           K22.70, Barrett's esophagus without dysplasia                           K44.9, Diaphragmatic hernia without obstruction or                             gangrene CPT copyright 2017 [employer] Medical Association. All rights reserved. The codes documented in this report are preliminary and upon coder review may  be revised to meet current compliance requirements. [name]. Tiffancy Moger, MD Gennette Pac, MD 03/18/2018 1:34:27 PM This report has been signed electronically. Number of Addenda: 0

## 2018-03-18 NOTE — Discharge Instructions (Signed)
°Colonoscopy °Discharge Instructions ° °Read the instructions outlined below and refer to this sheet in the next few weeks. These discharge instructions provide you with general information on caring for yourself after you leave the hospital. Your doctor may also give you specific instructions. While your treatment has been planned according to the most current medical practices available, unavoidable complications occasionally occur. If you have any problems or questions after discharge, call Dr. Rourk at [phone]. °ACTIVITY °· You may resume your regular activity, but move at a slower pace for the next 24 hours.  °· Take frequent rest periods for the next 24 hours.  °· Walking will help get rid of the air and reduce the bloated feeling in your belly (abdomen).  °· No driving for 24 hours (because of the medicine (anesthesia) used during the test).   °· Do not sign any important legal documents or operate any machinery for 24 hours (because of the anesthesia used during the test).  °NUTRITION °· Drink plenty of fluids.  °· You may resume your normal diet as instructed by your doctor.  °· Begin with a light meal and progress to your normal diet. Heavy or fried foods are harder to digest and may make you feel sick to your stomach (nauseated).  °· Avoid alcoholic beverages for 24 hours or as instructed.  °MEDICATIONS °· You may resume your normal medications unless your doctor tells you otherwise.  °WHAT YOU CAN EXPECT TODAY °· Some feelings of bloating in the abdomen.  °· Passage of more gas than usual.  °· Spotting of blood in your stool or on the toilet paper.  °IF YOU HAD POLYPS REMOVED DURING THE COLONOSCOPY: °· No aspirin products for 7 days or as instructed.  °· No alcohol for 7 days or as instructed.  °· Eat a soft diet for the next 24 hours.  °FINDING OUT THE RESULTS OF YOUR TEST °Not all test results are available during your visit. If your test results are not back during the visit, make an appointment  with your caregiver to find out the results. Do not assume everything is normal if you have not heard from your caregiver or the medical facility. It is important for you to follow up on all of your test results.  °SEEK IMMEDIATE MEDICAL ATTENTION IF: °· You have more than a spotting of blood in your stool.  °· Your belly is swollen (abdominal distention).  °· You are nauseated or vomiting.  °· You have a temperature over 101.  °· You have abdominal pain or discomfort that is severe or gets worse throughout the day.  °EGD °Discharge instructions °Please read the instructions outlined below and refer to this sheet in the next few weeks. These discharge instructions provide you with general information on caring for yourself after you leave the hospital. Your doctor may also give you specific instructions. While your treatment has been planned according to the most current medical practices available, unavoidable complications occasionally occur. If you have any problems or questions after discharge, please call your doctor. °ACTIVITY °· You may resume your regular activity but move at a slower pace for the next 24 hours.  °· Take frequent rest periods for the next 24 hours.  °· Walking will help expel (get rid of) the air and reduce the bloated feeling in your abdomen.  °· No driving for 24 hours (because of the anesthesia (medicine) used during the test).  °· You may shower.  °· Do not sign any important   legal documents or operate any machinery for 24 hours (because of the anesthesia used during the test).  NUTRITION  Drink plenty of fluids.   You may resume your normal diet.   Begin with a light meal and progress to your normal diet.   Avoid alcoholic beverages for 24 hours or as instructed by your caregiver.  MEDICATIONS  You may resume your normal medications unless your caregiver tells you otherwise.  WHAT YOU CAN EXPECT TODAY  You may experience abdominal discomfort such as a feeling of fullness  or gas pains.  FOLLOW-UP  Your doctor will discuss the results of your test with you.  SEEK IMMEDIATE MEDICAL ATTENTION IF ANY OF THE FOLLOWING OCCUR:  Excessive nausea (feeling sick to your stomach) and/or vomiting.   Severe abdominal pain and distention (swelling).   Trouble swallowing.   Temperature over 101 F (37.8 C).   Rectal bleeding or vomiting of blood.   I do not recommend future  Colonoscopy unless new symptoms develop  Further recommendations to follow  Pending review of pathology report  Information on Barrett's esophagus And GERD provided.   Barrett Esophagus Barrett esophagus occurs when the tissue that lines the esophagus changes or becomes damaged. The esophagus is the tube that carries food from the throat to the stomach. With Barrett esophagus, the cells that line the esophagus are replaced by cells that are similar to the lining of the intestines (intestinal metaplasia). Barrett esophagus itself may not cause any symptoms. However, many people who have Barrett esophagus also have gastroesophageal reflux disease (GERD), which may cause symptoms such as heartburn. Treatment may include medicines, procedures to destroy the abnormal cells, or surgery. Over time, a few people with this condition may develop cancer of the esophagus. What are the causes? The exact cause of this condition is not known. In some cases, the condition develops from damage to the lining of the esophagus caused by GERD. GERD occurs when stomach acids flow up from the stomach into the esophagus. Frequent symptoms of GERD may cause intestinal metaplasia or cause cell changes (dysplasia). What increases the risk? The following factors may make you more likely to develop this condition:  Having GERD.  Being any of the following: ? Male. ? White (Caucasian). ? Obese. ? Older than 50.  Having a hiatal hernia.  Smoking.  What are the signs or symptoms? People with Barrett esophagus  often have no symptoms. However, many people with this condition also have GERD. Symptoms of GERD may include:  Heartburn.  Difficulty swallowing.  Dry cough.  How is this diagnosed? Barrett esophagus may be diagnosed with an exam called an upper gastrointestinal endoscopy. During this exam, a thin, flexible tube (endoscope) is passed down your esophagus. The endoscope has a light and camera on the end of it. Your health care provider uses the endoscope to view the inside of your esophagus. During the exam, several tissue samples will be removed (biopsy) from your esophagus so they can be checked for intestinal metaplasia or dysplasia. How is this treated? Treatment for this condition may include:  Medicines (proton pump inhibitors, or PPIs) to decrease or stop GERD.  Periodic endoscopic exams to make sure that cancer is not developing.  A procedure or surgery for dysplasia. This may include: ? Endoscopic removal or destruction of abnormal cells. ? Removal of part of the esophagus (esophagectomy).  Follow these instructions at home: Eating and drinking  Eat more fruits and vegetables.  Avoid fatty foods.  Eat small,  frequent meals instead of large meals.  Avoid foods that cause heartburn. These foods include: ? Coffee and alcoholic drinks. ? Tomatoes and foods made with tomatoes. ? Greasy or spicy foods. ? Chocolate and peppermint. General instructions   Take over-the-counter and prescription medicines only as told by your health care provider.  Do not use any tobacco products, such as cigarettes, chewing tobacco, and e-cigarettes. If you need help quitting, ask your health care provider.  If your health care provider is treating you for GERD, make sure you follow all instructions and take medicines as directed.  Keep all follow-up visits as told by your health care provider. This is important. Contact a health care provider if:  You have heartburn or GERD  symptoms.  You have difficulty swallowing. Get help right away if:  You have chest pain.  You are unable to swallow.  You vomit blood or material that looks like coffee grounds.  Your stool (feces) is bright red or dark. This information is not intended to replace advice given to you by your health care provider. Make sure you discuss any questions you have with your health care provider.   Gastroesophageal Reflux Disease, Adult Normally, food travels down the esophagus and stays in the stomach to be digested. However, when a person has gastroesophageal reflux disease (GERD), food and stomach acid move back up into the esophagus. When this happens, the esophagus becomes sore and inflamed. Over time, GERD can create small holes (ulcers) in the lining of the esophagus. What are the causes? This condition is caused by a problem with the muscle between the esophagus and the stomach (lower esophageal sphincter, or LES). Normally, the LES muscle closes after food passes through the esophagus to the stomach. When the LES is weakened or abnormal, it does not close properly, and that allows food and stomach acid to go back up into the esophagus. The LES can be weakened by certain dietary substances, medicines, and medical conditions, including:  Tobacco use.  Pregnancy.  Having a hiatal hernia.  Heavy alcohol use.  Certain foods and beverages, such as coffee, chocolate, onions, and peppermint.  What increases the risk? This condition is more likely to develop in:  People who have an increased body weight.  People who have connective tissue disorders.  People who use NSAID medicines.  What are the signs or symptoms? Symptoms of this condition include:  Heartburn.  Difficult or painful swallowing.  The feeling of having a lump in the throat.  Abitter taste in the mouth.  Bad breath.  Having a large amount of saliva.  Having an upset or bloated  stomach.  Belching.  Chest pain.  Shortness of breath or wheezing.  Ongoing (chronic) cough or a night-time cough.  Wearing away of tooth enamel.  Weight loss.  Different conditions can cause chest pain. Make sure to see your health care provider if you experience chest pain. How is this diagnosed? Your health care provider will take a medical history and perform a physical exam. To determine if you have mild or severe GERD, your health care provider may also monitor how you respond to treatment. You may also have other tests, including:  An endoscopy toexamine your stomach and esophagus with a small camera.  A test thatmeasures the acidity level in your esophagus.  A test thatmeasures how much pressure is on your esophagus.  A barium swallow or modified barium swallow to show the shape, size, and functioning of your esophagus.  How is  this treated? The goal of treatment is to help relieve your symptoms and to prevent complications. Treatment for this condition may vary depending on how severe your symptoms are. Your health care provider may recommend:  Changes to your diet.  Medicine.  Surgery.  Follow these instructions at home: Diet  Follow a diet as recommended by your health care provider. This may involve avoiding foods and drinks such as: ? Coffee and tea (with or without caffeine). ? Drinks that containalcohol. ? Energy drinks and sports drinks. ? Carbonated drinks or sodas. ? Chocolate and cocoa. ? Peppermint and mint flavorings. ? Garlic and onions. ? Horseradish. ? Spicy and acidic foods, including peppers, chili powder, curry powder, vinegar, hot sauces, and barbecue sauce. ? Citrus fruit juices and citrus fruits, such as oranges, lemons, and limes. ? Tomato-based foods, such as red sauce, chili, salsa, and pizza with red sauce. ? Fried and fatty foods, such as donuts, french fries, potato chips, and high-fat dressings. ? High-fat meats, such as hot  dogs and fatty cuts of red and white meats, such as rib eye steak, sausage, ham, and bacon. ? High-fat dairy items, such as whole milk, butter, and cream cheese.  Eat small, frequent meals instead of large meals.  Avoid drinking large amounts of liquid with your meals.  Avoid eating meals during the 2-3 hours before bedtime.  Avoid lying down right after you eat.  Do not exercise right after you eat. General instructions  Pay attention to any changes in your symptoms.  Take over-the-counter and prescription medicines only as told by your health care provider. Do not take aspirin, ibuprofen, or other NSAIDs unless your health care provider told you to do so.  Do not use any tobacco products, including cigarettes, chewing tobacco, and e-cigarettes. If you need help quitting, ask your health care provider.  Wear loose-fitting clothing. Do not wear anything tight around your waist that causes pressure on your abdomen.  Raise (elevate) the head of your bed 6 inches (15cm).  Try to reduce your stress, such as with yoga or meditation. If you need help reducing stress, ask your health care provider.  If you are overweight, reduce your weight to an amount that is healthy for you. Ask your health care provider for guidance about a safe weight loss goal.  Keep all follow-up visits as told by your health care provider. This is important. Contact a health care provider if:  You have new symptoms.  You have unexplained weight loss.  You have difficulty swallowing, or it hurts to swallow.  You have wheezing or a persistent cough.  Your symptoms do not improve with treatment.  You have a hoarse voice. Get help right away if:  You have pain in your arms, neck, jaw, teeth, or back.  You feel sweaty, dizzy, or light-headed.  You have chest pain or shortness of breath.  You vomit and your vomit looks like blood or coffee grounds.  You faint.  Your stool is bloody or black.  You  cannot swallow, drink, or eat. This information is not intended to replace advice given to you by your health care provider. Make sure you discuss any questions you have with your health care provider. Document Released: 04/24/2005 Document Revised: 12/13/2015 Document Reviewed: 11/09/2014 Elsevier Interactive Patient Education  [employer]2018 [employer]

## 2018-03-18 NOTE — Addendum Note (Signed)
Addended by: Gelene MinkBOONE, Asaph Serena W on: 03/18/2018 08:39 AM   Modules accepted: Orders

## 2018-03-18 NOTE — Op Note (Addendum)
[facility] Patient Name: Gary CroonFairley Wheeler Procedure Date: 03/18/2018 12:29 PM MRN: [id number] Date of Birth: 1946/01/04 Attending MD: Gennette Pacobert Michael Deyton Ellenbecker , MD CSN: [id number] Age: [age] Admit Type: Outpatient Procedure:                Colonoscopy Indications:              Screening for colorectal malignant neoplasm Providers:                Gennette Pacobert Michael Myanna Ziesmer, MD, Nena PolioLisa Moore, RN, Dyann Ruddleonya                            Wilson Referring MD:              Medicines:                Midazolam 6 mg IV, Meperidine 50 mg IV, Ondansetron                            4 mg IV Complications:            No immediate complications. Estimated Blood Loss:     Estimated blood loss: none. Procedure:                Pre-Anesthesia Assessment:                           - Prior to the procedure, a History and Physical                            was performed, and patient medications and                            allergies were reviewed. The patient's tolerance of                            previous anesthesia was also reviewed. The risks                            and benefits of the procedure and the sedation                            options and risks were discussed with the patient.                            All questions were answered, and informed consent                            was obtained. Prior Anticoagulants: The patient has                            taken no previous anticoagulant or antiplatelet                            agents. ASA Grade Assessment: II - A patient with  mild systemic disease. After reviewing the risks                            and benefits, the patient was deemed in                            satisfactory condition to undergo the procedure.                           After obtaining informed consent, the colonoscope                            was passed under direct vision. Throughout the                            procedure, the patient's blood  pressure, pulse, and                            oxygen saturations were monitored continuously. The                            CF-[id number] ([id number] scope was introduced through                            the anus and advanced to the the cecum, identified                            by appendiceal orifice and ileocecal valve. The                            colonoscopy was performed without difficulty. The                            patient tolerated the procedure well. The quality                            of the bowel preparation was adequate. The entire                            colon was well visualized. Scope In: 1:18:43 PM Scope Out: 1:31:29 PM Scope Withdrawal Time: 0 hours 6 minutes 24 seconds  Total Procedure Duration: 0 hours 12 minutes 46 seconds  Findings:      The perianal and digital rectal examinations were normal.      Scattered diverticula were found in the sigmoid colon and descending       colon.      The exam was otherwise without abnormality on direct and retroflexion       views. Impression:               - Diverticulosis in the sigmoid colon and in the                            descending colon.                           -  The examination was otherwise normal on direct                            and retroflexion views.                           - No specimens collected. Moderate Sedation:      Moderate (conscious) sedation was administered by the endoscopy nurse       and supervised by the endoscopist. The following parameters were       monitored: oxygen saturation, heart rate, blood pressure, respiratory       rate, EKG, adequacy of pulmonary ventilation, and response to care.       Total physician intraservice time was 35 minutes. Recommendation:           - Patient has a contact number available for                            emergencies. The signs and symptoms of potential                            delayed complications were discussed with the                             patient. Return to normal activities tomorrow.                            Written discharge instructions were provided to the                            patient.                           - Resume previous diet.                           - Continue present medications.                           - No repeat colonoscopy due to age. Procedure Code(s):        --- Professional ---                           [phone], Colonoscopy, flexible; diagnostic, including                            collection of specimen(s) by brushing or washing,                            when performed (separate procedure)                           99153, Moderate sedation; each additional 15                            minutes intraservice time  G0500, Moderate sedation services provided by the                            same physician or other qualified health care                            professional performing a gastrointestinal                            endoscopic service that sedation supports,                            requiring the presence of an independent trained                            observer to assist in the monitoring of the                            patient's level of consciousness and physiological                            status; initial 15 minutes of intra-service time;                            patient age 42 years or older (additional time may                            be reported with 16109, as appropriate) Diagnosis Code(s):        --- Professional ---                           Z12.11, Encounter for screening for malignant                            neoplasm of colon                           K57.30, Diverticulosis of large intestine without                            perforation or abscess without bleeding CPT copyright 2018 [employer]. All rights reserved. The codes documented in this report are preliminary and upon coder review  may  be revised to meet current compliance requirements. [name]. [name], MD Gennette Pac, MD 03/18/2018 1:42:51 PM This report has been signed electronically. Number of Addenda: 0

## 2018-03-18 NOTE — H&P (Signed)
 @LOGO @   Primary Care Physician:  Grayland Jackichardson, Jennifer, PA-C Primary Gastroenterologist:  Dr. Jena Gaussourk  Pre-Procedure History & Physical: HPI:  Gary Wheeler is a 72 y.o. male here for surveillance EGD given reported hx of Barretts esophagus.  Average risk screening colonoscopy. Past Medical History:  Diagnosis Date  . Barrett's esophagus 2008  . CAD (coronary artery disease) 01/09/15   a. cath 01/09/15 PCI with 2 DES stents in LAD/1 in Dx with comcomitant CAD in distal LAD, [place].   . Diabetes mellitus without complication (HCC)   . Hypercholesterolemia   . Hypertension   . STEMI (ST elevation myocardial infarction) (HCC) 01/09/15    Past Surgical History:  Procedure Laterality Date  . CARDIAC CATHETERIZATION N/A 01/09/2015   Procedure: Left Heart Cath and Coronary Angiography;  Surgeon: Marykay Lexavid W Harding, MD;  [facility];  Service: Cardiovascular;  Laterality: N/A;  . CARDIAC CATHETERIZATION  01/09/2015   with 3 stents  . COLONOSCOPY  2008   Dr. Linna DarnerAnwar: normal  . ESOPHAGOGASTRODUODENOSCOPY  2008   Dr. Linna DarnerAnwar: Barrett's. Path with a minute fragment of squamous cell epithelium suggestive of dysplasia  . left ORIF ankle      Prior to Admission medications   Medication Sig Start Date End Date Taking? Authorizing Provider  amLODipine (NORVASC) 10 MG tablet Take 5 mg by mouth daily.    Yes [provider]  aspirin EC 81 MG tablet Take 81 mg by mouth daily.   Yes [provider]  cholecalciferol (VITAMIN D) 1000 UNITS tablet Take 4,000 Units by mouth daily.    Yes [provider]  insulin aspart (NOVOLOG FLEXPEN) 100 UNIT/ML FlexPen Inject 10-20 Units into the skin 3 (three) times daily with meals. Per sliding scale   Yes [provider]  insulin glargine (LANTUS) 100 UNIT/ML injection Inject 24 Units into the skin daily.   Yes [provider]  isosorbide mononitrate (IMDUR) 30 MG 24 hr tablet Take 1 tablet (30 mg total) by mouth  daily. 01/11/15  Yes Bhagat, Bhavinkumar, PA  ketotifen (ZADITOR) 0.025 % ophthalmic solution Place 1 drop into both eyes 2 (two) times daily as needed (for dry eyes).   Yes [provider]  lisinopril-hydrochlorothiazide (PRINZIDE,ZESTORETIC) 20-12.5 MG per tablet Take 1 tablet by mouth 2 (two) times daily.    Yes [provider]  loratadine (CLARITIN) 10 MG tablet Take 10 mg by mouth daily as needed for allergies.   Yes [provider]  metFORMIN (GLUCOPHAGE) 500 MG tablet Take 500 mg by mouth 2 (two) times daily with a meal.   Yes [provider]  metoprolol tartrate (LOPRESSOR) 25 MG tablet Take 12.5 mg by mouth 2 (two) times daily.   Yes [provider]  omeprazole (PRILOSEC) 20 MG capsule Take 20 mg by mouth daily.   Yes [provider]  potassium chloride (K-DUR,KLOR-CON) 10 MEQ tablet Take 10 mEq by mouth daily as needed (for cramping).   Yes [provider]  rosuvastatin (CRESTOR) 5 MG tablet Take 5 mg by mouth at bedtime.    Yes [provider]  vitamin B-12 (CYANOCOBALAMIN) 1000 MCG tablet Take 2,000 mcg by mouth daily.    Yes [provider]  nitroGLYCERIN (NITROSTAT) 0.4 MG SL tablet Place 1 tablet (0.4 mg total) under the tongue every 5 (five) minutes x 3 doses as needed for chest pain. 01/11/15   Manson PasseyBhagat, Bhavinkumar, PA    Allergies as of 02/11/2018 - Review Complete 02/11/2018  Allergen Reaction  Noted  . Lipitor [atorvastatin] Diarrhea 04/14/2015  . Pravastatin Diarrhea 04/14/2015    Family History  Problem Relation Age of Onset  . Coronary artery disease Father   . Coronary artery disease Paternal Grandfather   . Prostate cancer Brother   . Colon cancer Neg Hx   . Colon polyps Neg Hx     Social History   Socioeconomic History  . Marital status: Married    Spouse name: Not on file  . Number of children: Not on file  . Years of education: Not on file  . Highest education level: Not on file   Occupational History  . Occupation: Retired, but works 2 days/week gas station  Social Needs  . Financial resource strain: Not on file  . Food insecurity:    Worry: Not on file    Inability: Not on file  . Transportation needs:    Medical: Not on file    Non-medical: Not on file  Tobacco Use  . Smoking status: Current Some Day Smoker    Types: Cigars    Last attempt to quit: 07/30/1999    Years since quitting: 18.6  . Smokeless tobacco: Never Used  Substance and Sexual Activity  . Alcohol use: Yes    Alcohol/week: 0.0 standard drinks    Comment: rare, occasional mixed drink   . Drug use: No  . Sexual activity: Not on file  Lifestyle  . Physical activity:    Days per week: Not on file    Minutes per session: Not on file  . Stress: Not on file  Relationships  . Social connections:    Talks on phone: Not on file    Gets together: Not on file    Attends religious service: Not on file    Active member of club or organization: Not on file    Attends meetings of clubs or organizations: Not on file    Relationship status: Not on file  . Intimate partner violence:    Fear of current or ex partner: Not on file    Emotionally abused: Not on file    Physically abused: Not on file    Forced sexual activity: Not on file  Other Topics Concern  . Not on file  Social History Narrative   [place] in [place], [place].    Review of Systems: See HPI, otherwise negative ROS  Physical Exam: BP (!) 154/102   Pulse 62   Temp 97.7 F (36.5 C) (Oral)   Resp 18   Ht 5\' 10"  (1.778 m)   Wt 88.2 kg   SpO2 94%   BMI 27.91 kg/m  General:   Alert,  Well-developed, well-nourished, pleasant and cooperative in NAD Neck:  Supple; no masses or thyromegaly. No significant cervical adenopathy. Lungs:  Clear throughout to auscultation.   No wheezes, crackles, or rhonchi. No acute distress. Heart:  Regular rate and rhythm; no murmurs, clicks, rubs,  or gallops. Abdomen: Non-distended,  normal bowel sounds.  Soft and nontender without appreciable mass or hepatosplenomegaly.  Pulses:  Normal pulses noted. Extremities:  Without clubbing or edema.  Impression/Plan:  Reported history of Barrett's esophagus;  Due for screening colonoscopy. No dysphagia. The risks, benefits, limitations, imponderables and alternatives regarding both EGD and colonoscopy have been reviewed with the patient. Questions have been answered. All parties agreeable.      Notice: This dictation was prepared with Dragon dictation along with smaller phrase technology. Any transcriptional errors that result from this process are unintentional and  may not be corrected upon review.

## 2018-03-23 ENCOUNTER — Encounter (HOSPITAL_COMMUNITY): Payer: Self-pay | Admitting: Internal Medicine

## 2018-03-23 ENCOUNTER — Encounter: Payer: Self-pay | Admitting: Internal Medicine

## 2018-03-26 ENCOUNTER — Encounter: Payer: Self-pay | Admitting: Internal Medicine

## 2018-07-09 ENCOUNTER — Encounter: Payer: Self-pay | Admitting: Gastroenterology

## 2018-07-09 ENCOUNTER — Ambulatory Visit (INDEPENDENT_AMBULATORY_CARE_PROVIDER_SITE_OTHER): Payer: Medicare Other | Admitting: Gastroenterology

## 2018-07-09 VITALS — BP 130/82 | HR 71 | Temp 97.0°F | Ht 70.0 in | Wt 200.6 lb

## 2018-07-09 DIAGNOSIS — K2270 Barrett's esophagus without dysplasia: Secondary | ICD-10-CM

## 2018-07-09 NOTE — Progress Notes (Signed)
Referring Provider: Grayland Jackichardson, Jennifer, Demetrius CharityP* Primary Care Physician:  Grayland Jackichardson, Jennifer, PA-C Primary GI: Dr. Jena Gaussourk   Chief Complaint  Patient presents with  . barrett's    pp f/u doing fine    HPI:   Gary Wheeler is a 72 y.o. male presenting today for routine follow-up with a history of Barrett's diagnose in 2008, with recent EGD Aug 2019 without dysplasia. He will be due for surveillance in 3 years. Colonoscopy normal at time of EGD.   Continues with omeprazole daily. No dysphagia. No abdominal pain. No changes in bowel habits. NO GI concerns.      Past Medical History:  Diagnosis Date  . Barrett's esophagus 2008  . CAD (coronary artery disease) 01/09/15   a. cath 01/09/15 PCI with 2 DES stents in LAD/1 in Dx with comcomitant CAD in distal LAD, [place].   . Diabetes mellitus without complication (HCC)   . Hypercholesterolemia   . Hypertension   . STEMI (ST elevation myocardial infarction) (HCC) 01/09/15    Past Surgical History:  Procedure Laterality Date  . BIOPSY  03/18/2018   Procedure: BIOPSY;  Surgeon: Corbin Adeourk, Robert M, MD;  Location: [facility];  Service: Endoscopy;;  esophageal biopsies  . CARDIAC CATHETERIZATION N/A 01/09/2015   Procedure: Left Heart Cath and Coronary Angiography;  Surgeon: Marykay Lexavid W Harding, MD;  [facility];  Service: Cardiovascular;  Laterality: N/A;  . CARDIAC CATHETERIZATION  01/09/2015   with 3 stents  . COLONOSCOPY  2008   Dr. Linna DarnerAnwar: normal  . COLONOSCOPY N/A 03/18/2018   diverticulosis in sigmoid and descending  . ESOPHAGOGASTRODUODENOSCOPY  2008   Dr. Linna DarnerAnwar: Barrett's. Path with a minute fragment of squamous cell epithelium suggestive of dysplasia  . ESOPHAGOGASTRODUODENOSCOPY N/A 03/18/2018   DR. Rourk: Barrett's esophagus, small hiatal hernia, minimal polypoid gastric mucosa,normal duodenum, surveillance in 3 years  . left ORIF ankle      Current Outpatient Medications  Medication Sig Dispense Refill  .  amLODipine (NORVASC) 10 MG tablet Take 5 mg by mouth daily.     Marland Kitchen. aspirin EC 81 MG tablet Take 81 mg by mouth daily.    . cholecalciferol (VITAMIN D) 1000 UNITS tablet Take 4,000 Units by mouth daily.     . insulin aspart (NOVOLOG FLEXPEN) 100 UNIT/ML FlexPen Inject 10-20 Units into the skin 3 (three) times daily with meals. Per sliding scale    . insulin glargine (LANTUS) 100 UNIT/ML injection Inject 24 Units into the skin daily.    . isosorbide mononitrate (IMDUR) 30 MG 24 hr tablet Take 1 tablet (30 mg total) by mouth daily. 30 tablet 3  . ketotifen (ZADITOR) 0.025 % ophthalmic solution Place 1 drop into both eyes 2 (two) times daily as needed (for dry eyes).    Marland Kitchen. lisinopril-hydrochlorothiazide (PRINZIDE,ZESTORETIC) 20-12.5 MG per tablet Take 1 tablet by mouth 2 (two) times daily.     Marland Kitchen. loratadine (CLARITIN) 10 MG tablet Take 10 mg by mouth daily as needed for allergies.    . metFORMIN (GLUCOPHAGE) 500 MG tablet Take 500 mg by mouth 2 (two) times daily with a meal.    . metoprolol tartrate (LOPRESSOR) 25 MG tablet Take 12.5 mg by mouth 2 (two) times daily.    . nitroGLYCERIN (NITROSTAT) 0.4 MG SL tablet Place 1 tablet (0.4 mg total) under the tongue every 5 (five) minutes x 3 doses as needed for chest pain. 25 tablet 12  . omeprazole (PRILOSEC) 20 MG capsule Take 20 mg  by mouth daily.    . potassium chloride (K-DUR,KLOR-CON) 10 MEQ tablet Take 10 mEq by mouth daily as needed (for cramping).    . rosuvastatin (CRESTOR) 5 MG tablet Take 5 mg by mouth at bedtime.     . vitamin B-12 (CYANOCOBALAMIN) 1000 MCG tablet Take 2,000 mcg by mouth daily.      No current facility-administered medications for this visit.     Allergies as of 07/09/2018 - Review Complete 07/09/2018  Allergen Reaction Noted  . Lipitor [atorvastatin] Diarrhea 04/14/2015  . Pravastatin Diarrhea 04/14/2015    Family History  Problem Relation Age of Onset  . Coronary artery disease Father   . Coronary artery disease  Paternal Grandfather   . Prostate cancer Brother   . Colon cancer Neg Hx   . Colon polyps Neg Hx     Social History   Socioeconomic History  . Marital status: Married    Spouse name: Not on file  . Number of children: Not on file  . Years of education: Not on file  . Highest education level: Not on file  Occupational History  . Occupation: Retired, but works 2 days/week gas station  Social Needs  . Financial resource strain: Not on file  . Food insecurity:    Worry: Not on file    Inability: Not on file  . Transportation needs:    Medical: Not on file    Non-medical: Not on file  Tobacco Use  . Smoking status: Current Some Day Smoker    Types: Cigars    Last attempt to quit: 07/30/1999    Years since quitting: 18.9  . Smokeless tobacco: Never Used  Substance and Sexual Activity  . Alcohol use: Yes    Alcohol/week: 0.0 standard drinks    Comment: rare, occasional mixed drink   . Drug use: No  . Sexual activity: Not on file  Lifestyle  . Physical activity:    Days per week: Not on file    Minutes per session: Not on file  . Stress: Not on file  Relationships  . Social connections:    Talks on phone: Not on file    Gets together: Not on file    Attends religious service: Not on file    Active member of club or organization: Not on file    Attends meetings of clubs or organizations: Not on file    Relationship status: Not on file  Other Topics Concern  . Not on file  Social History Narrative   Lives in [place], [place] with wife.    Review of Systems: Gen: Denies fever, chills, anorexia. Denies fatigue, weakness, weight loss.  CV: Denies chest pain, palpitations, syncope, peripheral edema, and claudication. Resp: Denies dyspnea at rest, cough, wheezing, coughing up blood, and pleurisy. GI: see HPI  Derm: Denies rash, itching, dry skin Psych: Denies depression, anxiety, memory loss, confusion. No homicidal or suicidal ideation.  Heme: Denies bruising, bleeding,  and enlarged lymph nodes.  Physical Exam: BP 130/82   Pulse 71   Temp (!) 97 F (36.1 C) (Oral)   Ht 5\' 10"  (1.778 m)   Wt 200 lb 9.6 oz (91 kg)   BMI 28.78 kg/m  General:   Alert and oriented. No distress noted. Pleasant and cooperative.  Head:  Normocephalic and atraumatic. Eyes:  Conjuctiva clear without scleral icterus. Mouth:  Oral mucosa pink and moist.  Abdomen:  +BS, soft, non-tender and non-distended. No rebound or guarding. No HSM or masses noted. Msk:  Symmetrical without gross deformities. Normal posture. Extremities:  Without edema. Neurologic:  Alert and  oriented x4 Psych:  Alert and cooperative. Normal mood and affect.

## 2018-07-09 NOTE — Patient Instructions (Addendum)
Continue Prilosec once each morning, 30 minutes before breakfast.  We will see you in 1 year!  Have a wonderful Christmas!  I enjoyed seeing you again today! As you know, I value our relationship and want to provide genuine, compassionate, and quality care. I welcome your feedback. If you receive a survey regarding your visit,  I greatly appreciate you taking time to fill this out. See you next time!  Gelene MinkAnna W. Key Cen, PhD, ANP-BC [facility]

## 2018-07-09 NOTE — Progress Notes (Signed)
cc'd to pcp 

## 2018-07-09 NOTE — Assessment & Plan Note (Signed)
Diagnosed in 2008, recent surveillance completed this year without dysplasia. Needs surveillance in 3 years. Continue on PPI indefinitely. Return in 1 year for routine follow-up.

## 2018-08-24 ENCOUNTER — Encounter (HOSPITAL_COMMUNITY): Payer: Self-pay

## 2018-08-24 ENCOUNTER — Other Ambulatory Visit: Payer: Self-pay

## 2018-08-24 ENCOUNTER — Emergency Department (HOSPITAL_COMMUNITY): Payer: Medicare Other

## 2018-08-24 ENCOUNTER — Observation Stay (HOSPITAL_COMMUNITY)
Admission: EM | Admit: 2018-08-24 | Discharge: 2018-08-25 | Disposition: A | Discharge status: Home / Self Care | Payer: Medicare Other | Attending: Internal Medicine | Admitting: Internal Medicine

## 2018-08-24 DIAGNOSIS — Z794 Long term (current) use of insulin: Secondary | ICD-10-CM | POA: Insufficient documentation

## 2018-08-24 DIAGNOSIS — K219 Gastro-esophageal reflux disease without esophagitis: Secondary | ICD-10-CM | POA: Diagnosis not present

## 2018-08-24 DIAGNOSIS — F17290 Nicotine dependence, other tobacco product, uncomplicated: Secondary | ICD-10-CM | POA: Insufficient documentation

## 2018-08-24 DIAGNOSIS — I2510 Atherosclerotic heart disease of native coronary artery without angina pectoris: Secondary | ICD-10-CM | POA: Diagnosis not present

## 2018-08-24 DIAGNOSIS — Z7982 Long term (current) use of aspirin: Secondary | ICD-10-CM | POA: Insufficient documentation

## 2018-08-24 DIAGNOSIS — E785 Hyperlipidemia, unspecified: Secondary | ICD-10-CM

## 2018-08-24 DIAGNOSIS — R079 Chest pain, unspecified: Secondary | ICD-10-CM | POA: Diagnosis present

## 2018-08-24 DIAGNOSIS — Z955 Presence of coronary angioplasty implant and graft: Secondary | ICD-10-CM

## 2018-08-24 DIAGNOSIS — I119 Hypertensive heart disease without heart failure: Secondary | ICD-10-CM | POA: Diagnosis not present

## 2018-08-24 DIAGNOSIS — Z888 Allergy status to other drugs, medicaments and biological substances status: Secondary | ICD-10-CM | POA: Insufficient documentation

## 2018-08-24 DIAGNOSIS — I25119 Atherosclerotic heart disease of native coronary artery with unspecified angina pectoris: Secondary | ICD-10-CM

## 2018-08-24 DIAGNOSIS — Z79899 Other long term (current) drug therapy: Secondary | ICD-10-CM | POA: Insufficient documentation

## 2018-08-24 DIAGNOSIS — R072 Precordial pain: Secondary | ICD-10-CM | POA: Insufficient documentation

## 2018-08-24 DIAGNOSIS — I10 Essential (primary) hypertension: Secondary | ICD-10-CM | POA: Diagnosis present

## 2018-08-24 DIAGNOSIS — I252 Old myocardial infarction: Secondary | ICD-10-CM | POA: Insufficient documentation

## 2018-08-24 DIAGNOSIS — E119 Type 2 diabetes mellitus without complications: Principal | ICD-10-CM | POA: Insufficient documentation

## 2018-08-24 DIAGNOSIS — IMO0001 Reserved for inherently not codable concepts without codable children: Secondary | ICD-10-CM

## 2018-08-24 DIAGNOSIS — I255 Ischemic cardiomyopathy: Secondary | ICD-10-CM | POA: Diagnosis not present

## 2018-08-24 DIAGNOSIS — I25118 Atherosclerotic heart disease of native coronary artery with other forms of angina pectoris: Secondary | ICD-10-CM | POA: Diagnosis not present

## 2018-08-24 LAB — CBC WITH DIFFERENTIAL/PLATELET
ABS IMMATURE GRANULOCYTES: 0.03 10*3/uL (ref 0.00–0.07)
BASOS PCT: 0 %
Basophils Absolute: 0 10*3/uL (ref 0.0–0.1)
Eosinophils Absolute: 0.2 10*3/uL (ref 0.0–0.5)
Eosinophils Relative: 2 %
HCT: 42.9 % (ref 39.0–52.0)
Hemoglobin: 14.5 g/dL (ref 13.0–17.0)
Immature Granulocytes: 0 %
Lymphocytes Relative: 25 %
Lymphs Abs: 1.7 10*3/uL (ref 0.7–4.0)
MCH: 28.7 pg (ref 26.0–34.0)
MCHC: 33.8 g/dL (ref 30.0–36.0)
MCV: 85 fL (ref 80.0–100.0)
MONO ABS: 0.6 10*3/uL (ref 0.1–1.0)
Monocytes Relative: 8 %
NEUTROS ABS: 4.6 10*3/uL (ref 1.7–7.7)
NEUTROS PCT: 65 %
NRBC: 0 % (ref 0.0–0.2)
PLATELETS: 268 10*3/uL (ref 150–400)
RBC: 5.05 MIL/uL (ref 4.22–5.81)
RDW: 12.4 % (ref 11.5–15.5)
WBC: 7.1 10*3/uL (ref 4.0–10.5)

## 2018-08-24 LAB — CBG MONITORING, ED: Glucose-Capillary: 249 mg/dL — ABNORMAL HIGH (ref 70–99)

## 2018-08-24 LAB — TROPONIN I
Troponin I: <0.03 ng/mL (ref <0.03)
Troponin I: <0.03 ng/mL (ref <0.03)

## 2018-08-24 LAB — BASIC METABOLIC PANEL
Anion gap: 9 (ref 5–15)
BUN: 17 mg/dL (ref 8–23)
CO2: 25 mmol/L (ref 22–32)
CREATININE: 0.74 mg/dL (ref 0.61–1.24)
Calcium: 9.1 mg/dL (ref 8.9–10.3)
Chloride: 103 mmol/L (ref 98–111)
GFR calc Af Amer: >60 mL/min (ref >60)
Glucose, Bld: 238 mg/dL — ABNORMAL HIGH (ref 70–99)
POTASSIUM: 3.8 mmol/L (ref 3.5–5.1)
SODIUM: 137 mmol/L (ref 135–145)

## 2018-08-24 LAB — GLUCOSE, CAPILLARY
Glucose-Capillary: 202 mg/dL — ABNORMAL HIGH (ref 70–99)
Glucose-Capillary: 203 mg/dL — ABNORMAL HIGH (ref 70–99)

## 2018-08-24 LAB — LIPID PANEL
CHOL/HDL RATIO: 3 ratio
Cholesterol: 111 mg/dL (ref 0–200)
HDL: 37 mg/dL — AB (ref >40)
LDL Cholesterol: 60 mg/dL (ref 0–99)
Triglycerides: 68 mg/dL (ref <150)
VLDL: 14 mg/dL (ref 0–40)

## 2018-08-24 MED ORDER — HYDROCHLOROTHIAZIDE 12.5 MG PO CAPS
12.5000 mg | ORAL_CAPSULE | Freq: Two times a day (BID) | ORAL | Status: DC
  Administered 2018-08-24 – 2018-08-25 (×2): 12.5 mg via ORAL
  Filled 2018-08-24 (×6): qty 1

## 2018-08-24 MED ORDER — METOPROLOL TARTRATE 25 MG PO TABS
12.5000 mg | ORAL_TABLET | Freq: Two times a day (BID) | ORAL | Status: DC
  Administered 2018-08-24 – 2018-08-25 (×3): 12.5 mg via ORAL
  Filled 2018-08-24 (×3): qty 1

## 2018-08-24 MED ORDER — ACETAMINOPHEN 325 MG PO TABS: 650.0000 mg | ORAL_TABLET | ORAL | Status: DC | PRN

## 2018-08-24 MED ORDER — INSULIN ASPART 100 UNIT/ML ~~LOC~~ SOLN
0.0000 [IU] | Freq: Three times a day (TID) | SUBCUTANEOUS | Status: DC
  Administered 2018-08-24: 5 [IU] via SUBCUTANEOUS
  Administered 2018-08-25 (×2): 3 [IU] via SUBCUTANEOUS

## 2018-08-24 MED ORDER — LISINOPRIL 10 MG PO TABS
20.0000 mg | ORAL_TABLET | Freq: Two times a day (BID) | ORAL | Status: DC
  Administered 2018-08-24 – 2018-08-25 (×2): 20 mg via ORAL
  Filled 2018-08-24 (×3): qty 2

## 2018-08-24 MED ORDER — MORPHINE SULFATE (PF) 2 MG/ML IV SOLN: 2.0000 mg | INTRAVENOUS | Status: DC | PRN

## 2018-08-24 MED ORDER — ALUM & MAG HYDROXIDE-SIMETH 200-200-20 MG/5ML PO SUSP
30.0000 mL | Freq: Once | ORAL | Status: AC
  Administered 2018-08-24: 30 mL via ORAL
  Filled 2018-08-24: qty 30

## 2018-08-24 MED ORDER — INFLUENZA VAC SPLIT HIGH-DOSE 0.5 ML IM SUSY: 0.5000 mL | PREFILLED_SYRINGE | INTRAMUSCULAR | Status: DC

## 2018-08-24 MED ORDER — ASPIRIN EC 325 MG PO TBEC
325.0000 mg | DELAYED_RELEASE_TABLET | Freq: Every day | ORAL | Status: DC
  Administered 2018-08-24 – 2018-08-25 (×2): 325 mg via ORAL
  Filled 2018-08-24 (×2): qty 1

## 2018-08-24 MED ORDER — INSULIN ASPART 100 UNIT/ML ~~LOC~~ SOLN
0.0000 [IU] | Freq: Every day | SUBCUTANEOUS | Status: DC
  Administered 2018-08-24: 2 [IU] via SUBCUTANEOUS

## 2018-08-24 MED ORDER — LIDOCAINE VISCOUS HCL 2 % MT SOLN
15.0000 mL | Freq: Once | OROMUCOSAL | Status: AC
  Administered 2018-08-24: 15 mL via ORAL
  Filled 2018-08-24: qty 15

## 2018-08-24 MED ORDER — ONDANSETRON HCL 4 MG/2ML IJ SOLN: 4.0000 mg | Freq: Four times a day (QID) | INTRAMUSCULAR | Status: DC | PRN

## 2018-08-24 MED ORDER — NITROGLYCERIN 0.4 MG SL SUBL
0.4000 mg | SUBLINGUAL_TABLET | Freq: Once | SUBLINGUAL | Status: AC
  Administered 2018-08-24: 0.4 mg via SUBLINGUAL
  Filled 2018-08-24: qty 1

## 2018-08-24 MED ORDER — INSULIN GLARGINE 100 UNIT/ML ~~LOC~~ SOLN
24.0000 [IU] | Freq: Every day | SUBCUTANEOUS | Status: DC
  Administered 2018-08-24 – 2018-08-25 (×2): 24 [IU] via SUBCUTANEOUS
  Filled 2018-08-24 (×3): qty 0.24

## 2018-08-24 MED ORDER — NITROGLYCERIN 0.4 MG SL SUBL: 0.4000 mg | SUBLINGUAL_TABLET | SUBLINGUAL | Status: DC | PRN

## 2018-08-24 MED ORDER — ROSUVASTATIN CALCIUM 10 MG PO TABS
5.0000 mg | ORAL_TABLET | Freq: Every day | ORAL | Status: DC
  Administered 2018-08-24: 5 mg via ORAL
  Filled 2018-08-24 (×3): qty 1

## 2018-08-24 MED ORDER — ENOXAPARIN SODIUM 40 MG/0.4ML ~~LOC~~ SOLN
40.0000 mg | SUBCUTANEOUS | Status: DC
  Administered 2018-08-24: 40 mg via SUBCUTANEOUS
  Filled 2018-08-24: qty 0.4

## 2018-08-24 MED ORDER — LISINOPRIL-HYDROCHLOROTHIAZIDE 20-12.5 MG PO TABS: 1.0000 | ORAL_TABLET | Freq: Two times a day (BID) | ORAL | Status: DC

## 2018-08-24 MED ORDER — ISOSORBIDE MONONITRATE ER 60 MG PO TB24
30.0000 mg | ORAL_TABLET | Freq: Every day | ORAL | Status: DC
  Administered 2018-08-24 – 2018-08-25 (×2): 30 mg via ORAL
  Filled 2018-08-24 (×5): qty 1

## 2018-08-24 MED ORDER — PANTOPRAZOLE SODIUM 40 MG PO TBEC
40.0000 mg | DELAYED_RELEASE_TABLET | Freq: Every day | ORAL | Status: DC
  Administered 2018-08-24 – 2018-08-25 (×2): 40 mg via ORAL
  Filled 2018-08-24 (×2): qty 1

## 2018-08-24 MED ORDER — AMLODIPINE BESYLATE 5 MG PO TABS
5.0000 mg | ORAL_TABLET | Freq: Every day | ORAL | Status: DC
  Administered 2018-08-24 – 2018-08-25 (×2): 5 mg via ORAL
  Filled 2018-08-24 (×2): qty 1

## 2018-08-24 MED ORDER — ASPIRIN 81 MG PO CHEW
324.0000 mg | CHEWABLE_TABLET | Freq: Once | ORAL | Status: AC
  Administered 2018-08-24: 324 mg via ORAL
  Filled 2018-08-24: qty 4

## 2018-08-24 NOTE — Consult Note (Signed)
Cardiology Consultation:   Patient ID: Gary AquasFairley W Sachdeva MRN: [id number]; DOB: 08-25-1945  Admit date: 08/24/2018 Date of Consult: 08/24/2018  Primary Care Provider: [place] Primary Cardiologist: [facility] Primary Electrophysiologist:  None    Patient Profile:   Gary Wheeler is a 73 y.o. male with a hx of coronary artery disease, hypertension, type 2 diabetes mellitus, and hyperlipidemia who is being seen today for the evaluation of chest pain at the request of Dr. Jacqulyn BathLong.  History of Present Illness:   Gary Wheeler is a 73 year old gentleman with a history of coronary disease who was hospitalized in June 2016 with STEMI and underwent drug-eluting stent placement x2 to the LAD and a drug-eluting stent to the second diagonal branch.  A 70% ramus lesion was medically managed and a 90% third RPL B lesion was thought to distal for stenting and manage medically at that time.  Cardiac catheterization results and echocardiogram reviewed below.  LVEF was 45 to 50% in June 2016 by echocardiogram.  He was last seen by Dr. Wyline MoodBranch in the office on 04/14/2015.  He was supposed to follow-up 6 months later but did not.  At the end of last week, he began experiencing a left-sided chest tightness primarily at night alleviated with belching. This morning, he developed left sided chest discomfort. He would belch and symptoms would subside to some degree but then quickly recur. He took a nitroglycerin tablet prior to leaving his home in [place] to come to the [facility] ED.  Here he received 4 ASA 81 mg followed by one nitroglycerin tablet at which point the pain resolved.  He denies associated shortness of breath, nausea, dizziness, palpitations, and lightheadedness. He also denies abdominal pain, constipation, and diarrhea.  He has recently gotten over an upper respiratory tract infection. His wife is now getting over one as well.  Initial troponin is normal.  ECG reviewed below without acute  ischemic abnormalities.  CBC and basic metabolic panel also unremarkable other than hyperglycemia.  Chest x-ray is unremarkable.  He sees his cardiologist at the [place] annually but hasn't seen him in about a year.  He had to use nitro once before about 3 months ago.  Past Medical History:  Diagnosis Date  . Barrett's esophagus 2008  . CAD (coronary artery disease) 01/09/15   a. cath 01/09/15 PCI with 2 DES stents in LAD/1 in Dx with comcomitant CAD in distal LAD, RI.   . Diabetes mellitus without complication (HCC)   . Hypercholesterolemia   . Hypertension   . STEMI (ST elevation myocardial infarction) (HCC) 01/09/15    Past Surgical History:  Procedure Laterality Date  . BIOPSY  03/18/2018   Procedure: BIOPSY;  Surgeon: Corbin Adeourk, Robert M, MD;  Location: [facility];  Service: Endoscopy;;  esophageal biopsies  . CARDIAC CATHETERIZATION N/A 01/09/2015   Procedure: Left Heart Cath and Coronary Angiography;  Surgeon: Marykay Lexavid W Harding, MD;  [facility];  Service: Cardiovascular;  Laterality: N/A;  . CARDIAC CATHETERIZATION  01/09/2015   with 3 stents  . COLONOSCOPY  2008   Dr. Linna DarnerAnwar: normal  . COLONOSCOPY N/A 03/18/2018   diverticulosis in sigmoid and descending  . ESOPHAGOGASTRODUODENOSCOPY  2008   Dr. Linna DarnerAnwar: Barrett's. Path with a minute fragment of squamous cell epithelium suggestive of dysplasia  . ESOPHAGOGASTRODUODENOSCOPY N/A 03/18/2018   DR. Rourk: Barrett's esophagus, small hiatal hernia, minimal polypoid gastric mucosa,normal duodenum, surveillance in 3 years  . left ORIF ankle  Inpatient Medications: Scheduled Meds:  Continuous Infusions:  PRN Meds:   Allergies:    Allergies  Allergen Reactions  . Lipitor [Atorvastatin] Diarrhea  . Pravastatin Diarrhea    Social History:   Social History   Socioeconomic History  . Marital status: Married    Spouse name: Not on file  . Number of children: Not on file  . Years of education: Not on file    . Highest education level: Not on file  Occupational History  . Occupation: Retired, but works 2 days/week gas station  Social Needs  . Financial resource strain: Not on file  . Food insecurity:    Worry: Not on file    Inability: Not on file  . Transportation needs:    Medical: Not on file    Non-medical: Not on file  Tobacco Use  . Smoking status: Current Some Day Smoker    Types: Cigars    Last attempt to quit: 07/30/1999    Years since quitting: 19.0  . Smokeless tobacco: Never Used  Substance and Sexual Activity  . Alcohol use: Yes    Alcohol/week: 0.0 standard drinks    Comment: rare, occasional mixed drink   . Drug use: No  . Sexual activity: Not on file  Lifestyle  . Physical activity:    Days per week: Not on file    Minutes per session: Not on file  . Stress: Not on file  Relationships  . Social connections:    Talks on phone: Not on file    Gets together: Not on file    Attends religious service: Not on file    Active member of club or organization: Not on file    Attends meetings of clubs or organizations: Not on file    Relationship status: Not on file  . Intimate partner violence:    Fear of current or ex partner: Not on file    Emotionally abused: Not on file    Physically abused: Not on file    Forced sexual activity: Not on file  Other Topics Concern  . Not on file  Social History Narrative   Lives in [place], [place] with [place].    Family History:    Family History  Problem Relation Age of Onset  . Coronary artery disease Father   . Coronary artery disease Paternal Grandfather   . Prostate cancer Brother   . Colon cancer Neg Hx   . Colon polyps Neg Hx      ROS:  Please see the history of present illness.   All other ROS reviewed and negative.     Physical Exam/Data:   Vitals:   08/24/18 0939 08/24/18 0942 08/24/18 1000 08/24/18 1030  BP:  (!) 170/110 (!) 146/99 (!) 128/98  Pulse:  66 63 (!) 59  Resp:  20 18 16   Temp:  97.7 F (36.5  C)    TempSrc:  Oral    SpO2:  97% 96% 96%  Weight: 90.7 kg     Height: 5\' 10"  (1.778 m)      No intake or output data in the 24 hours ending 08/24/18 1143 Last 3 Weights 08/24/2018 07/09/2018 03/18/2018  Weight (lbs) 200 lb 200 lb 9.6 oz 194 lb 8 oz  Weight (kg) 90.719 kg 90.992 kg 88.225 kg     Body mass index is 28.7 kg/m.  General:  Well nourished, well developed, in no acute distress HEENT: normal Lymph: no adenopathy Neck: no JVD Endocrine:  No thryomegaly Vascular:  No carotid bruits; FA pulses 2+ bilaterally without bruits  Cardiac:  normal S1, S2; RRR; no murmur  Lungs:  clear to auscultation bilaterally, no wheezing, rhonchi or rales  Abd: soft, nontender, no hepatomegaly  Ext: no edema Musculoskeletal:  No deformities, BUE and BLE strength normal and equal Skin: warm and dry  Neuro:  CNs 2-12 intact, no focal abnormalities noted Psych:  Normal affect   EKG:  The EKG was personally reviewed and demonstrates:  normal sinus rhythm with no ischemic ST segment or T-wave abnormalities, nor any arrhythmias.  Telemetry:  Telemetry was personally reviewed and demonstrates: Sinus rhythm  Relevant CV Studies:  Cardiac catheterization 01/09/2015: 1. Mid LAD to Dist LAD lesion, 95% stenosed. 2 overlapping drug-eluting stents (Xience Alpine 2.5 mm x 18 mm, 2.5 mm x 8 mm - postdilated to 3.25 mm proximally and 2.7 mm distally) were placed. There is a 0% residual stenosis post intervention. 2. Ost 2nd Diag to 2nd Diag lesion, 90% stenosed. A Xience Alpine 2.25 mm x 12 mm drug-eluting stent was placed - mini crush technique with the LAD stent. There is a 0% residual stenosis post intervention. 3. Dist LAD lesion, 99% stenosed due to distal thrombus embolization. Also mild residual wire related dissection in the distal D2 with distal embolus of thrombus 4. Ramus lesion, 70% stenosed. Consider medical management for now. 5. Proximal 3rd RPLB-1 lesion, 65% stenosed. Inferior branch of  distal 3rd RPLB-2 lesion, 90% stenosed. Likely too far distal for PCI. Consider medical management for now. 6. Mid LAD lesion, 40% stenosed. 7. There is mild left ventricular systolic dysfunction. With distal anterior and inferoapical hypokinesis. This would likely explain the inferior ST elevations.   Multivessel disease with downstream disease in the distal LAD and RPL system as well as moderate caliber ramus intermedius lesion. Successful complex bifurcation stenting of the mid LAD/D2 with mini crush technique and kissing balloon stent post-dilation.  Recommendations:  Standard radial band removal post PCI  Will continue Aggrastat for 12 hours post PCI for the distal thrombus embolization and D2 distal dissection  Will run IV nitroglycerin for blood pressure and post PCI residual angina  Restart home blood pressure medications  Sliding scale insulin with glipizide. Hold metformin 48 hours.  He will likely need aggressive glycemic control.  Echocardiogram 01/10/2015:  Study Conclusions  - Left ventricle: The cavity size was normal. Systolic function was   mildly reduced. The estimated ejection fraction was in the range   of 45% to 50%. There is akinesis of the apicalinferior and apical   myocardium. Left ventricular diastolic function parameters were   normal. No evidence of thrombus.   Laboratory Data:  Chemistry Recent Labs  Lab 08/24/18 0946  NA 137  K 3.8  CL 103  CO2 25  GLUCOSE 238*  BUN 17  CREATININE 0.74  CALCIUM 9.1  GFRNONAA >60  GFRAA >60  ANIONGAP 9    No results for input(s): PROT, ALBUMIN, AST, ALT, ALKPHOS, BILITOT in the last 168 hours. Hematology Recent Labs  Lab 08/24/18 0946  WBC 7.1  RBC 5.05  HGB 14.5  HCT 42.9  MCV 85.0  MCH 28.7  MCHC 33.8  RDW 12.4  PLT 268   Cardiac Enzymes Recent Labs  Lab 08/24/18 0946  TROPONINI <0.03   No results for input(s): TROPIPOC in the last 168 hours.  BNPNo results for input(s): BNP, PROBNP  in the last 168 hours.  DDimer No results for input(s): DDIMER in the last 168 hours.  Radiology/Studies:  Dg Chest 2 View  Result Date: 08/24/2018 CLINICAL DATA:  Chest pain and indigestion for 3 days EXAM: CHEST - 2 VIEW COMPARISON:  None. FINDINGS: Normal heart size. Lungs clear. No pneumothorax. No pleural effusion. IMPRESSION: No active cardiopulmonary disease. Electronically Signed   By: Jolaine Click M.D.   On: 08/24/2018 10:06    Assessment and Plan:   1.  Chest pain: Symptoms have resolved after 2 nitroglycerin tablets and 4 ASA 81 mg. Symptoms certainly sound potentially anginal in etiology. Initial troponin is normal.  ECG is without acute ischemic changes.  I recommend checking serial troponins.  If troponin significantly rise he will require coronary angiography.  If they remain normal, I would recommend inpatient stress testing tomorrow.  I would keep him n.p.o. at midnight for this reason. I will order a 2-D echocardiogram with Doppler to evaluate cardiac structure, function, and regional wall motion. Continue home medications which include aspirin 81 mg, Imdur 30 mg, metoprolol 12.5 mg twice daily, and rosuvastatin 5 mg.  2.  Coronary disease: See discussion in #1. Cath results reviewed above.   3.  Hypertension: BP mildly elevated at present, normal earlier this morning. Continue to monitor.  4.  Hyperlipidemia: Currently on rosuvastatin 5 mg.  I will obtain a lipid panel.  5.  Ischemic cardiomyopathy: LVEF 45 to 50% in June 2016.  I will obtain a follow-up echocardiogram to assess for interval changes in cardiac structure and function.    For questions or updates, please contact [facility] Please consult [email or web address] for contact info under     Signed, Prentice Docker, MD  08/24/2018 11:43 AM

## 2018-08-24 NOTE — ED Notes (Signed)
Pt given lunch tray.

## 2018-08-24 NOTE — ED Notes (Signed)
ED TO INPATIENT HANDOFF REPORT  Name/Age/Gender Gary Wheeler 73 y.o. male  Code Status    Code Status Orders  (From admission, onward)         Start     Ordered   08/24/18 1502  Full code  Continuous     08/24/18 1501        Code Status History    Date Active Date Inactive Code Status Order ID Comments User Context   01/09/2015 1604 01/11/2015 1741 Full Code [id number]  Jacinta Shoe Inpatient   01/09/2015 1544 01/09/2015 1604 Full Code [id number]  Marykay Lex, MD Inpatient      Home/SNF/Other Home  Chief Complaint CHEST PAIN  Level of Care/Admitting Diagnosis ED Disposition    ED Disposition Condition Comment   Admit  Hospital Area: [facility] [[id number]]  Level of Care: Telemetry [5]  Diagnosis: Chest pain [826415]  Admitting Physician: Erick Blinks [[id number]]  Attending Physician: Erick Blinks [[id number]]  PT Class (Do Not Modify): Observation [104]  PT Acc Code (Do Not Modify): Observation [10022]       Medical History Past Medical History:  Diagnosis Date  . Barrett's esophagus 2008  . CAD (coronary artery disease) 01/09/15   a. cath 01/09/15 PCI with 2 DES stents in LAD/1 in Dx with comcomitant CAD in distal LAD, RI.   . Diabetes mellitus without complication (HCC)   . Hypercholesterolemia   . Hypertension   . STEMI (ST elevation myocardial infarction) (HCC) 01/09/15    Allergies Allergies  Allergen Reactions  . Lipitor [Atorvastatin] Diarrhea  . Pravastatin Diarrhea    IV Location/Drains/Wounds Patient Lines/Drains/Airways Status   Active Line/Drains/Airways    Name:   Placement date:   Placement time:   Site:   Days:   Peripheral IV 08/24/18 Right Antecubital   08/24/18    0945    Antecubital   less than 1          Labs/Imaging Results for orders placed or performed during the hospital encounter of 08/24/18 (from the past 48 hour(s))  CBC with Differential     Status: None   Collection Time: 08/24/18  9:46 AM   Result Value Ref Range   WBC 7.1 4.0 - 10.5 K/uL   RBC 5.05 4.22 - 5.81 MIL/uL   Hemoglobin 14.5 13.0 - 17.0 g/dL   HCT 83.0 94.0 - 76.8 %   MCV 85.0 80.0 - 100.0 fL   MCH 28.7 26.0 - 34.0 pg   MCHC 33.8 30.0 - 36.0 g/dL   RDW 08.8 11.0 - 31.5 %   Platelets 268 150 - 400 K/uL   nRBC 0.0 0.0 - 0.2 %   Neutrophils Relative % 65 %   Neutro Abs 4.6 1.7 - 7.7 K/uL   Lymphocytes Relative 25 %   Lymphs Abs 1.7 0.7 - 4.0 K/uL   Monocytes Relative 8 %   Monocytes Absolute 0.6 0.1 - 1.0 K/uL   Eosinophils Relative 2 %   Eosinophils Absolute 0.2 0.0 - 0.5 K/uL   Basophils Relative 0 %   Basophils Absolute 0.0 0.0 - 0.1 K/uL   Immature Granulocytes 0 %   Abs Immature Granulocytes 0.03 0.00 - 0.07 K/uL    Comment: Performed at [facility], [address], [place], [place] [address]  Basic metabolic panel     Status: Abnormal   Collection Time: 08/24/18  9:46 AM  Result Value Ref Range   Sodium 137 135 - 145 mmol/L   Potassium  3.8 3.5 - 5.1 mmol/L   Chloride 103 98 - 111 mmol/L   CO2 25 22 - 32 mmol/L   Glucose, Bld 238 (H) 70 - 99 mg/dL   BUN 17 8 - 23 mg/dL   Creatinine, Ser 1.75 0.61 - 1.24 mg/dL   Calcium 9.1 8.9 - 10.2 mg/dL   GFR calc non Af Amer >60 >60 mL/min   GFR calc Af Amer >60 >60 mL/min   Anion gap 9 5 - 15    Comment: Performed at [facility], [address] [place], [place] [address]  Troponin I - ONCE - STAT     Status: None   Collection Time: 08/24/18  9:46 AM  Result Value Ref Range   Troponin I <0.03 <0.03 ng/mL    Comment: Performed at [facility], [address], [place], [place] [address]  Lipid panel     Status: Abnormal   Collection Time: 08/24/18  9:46 AM  Result Value Ref Range   Cholesterol 111 0 - 200 mg/dL   Triglycerides 68 <353 mg/dL   HDL 37 (L) >61 mg/dL   Total CHOL/HDL Ratio 3.0 RATIO   VLDL 14 0 - 40 mg/dL   LDL Cholesterol 60 0 - 99 mg/dL    Comment:        Total Cholesterol/HDL:CHD Risk Coronary Heart Disease Risk Table                      Men   Women  1/2 Average Risk   3.4   3.3  Average Risk       5.0   4.4  2 X Average Risk   9.6   7.1  3 X Average Risk  23.4   11.0        Use the calculated Patient Ratio above and the CHD Risk Table to determine the patient's CHD Risk.        ATP III CLASSIFICATION (LDL):  <100     mg/dL   Optimal  443-154  mg/dL   Near or Above                    Optimal  130-159  mg/dL   Borderline  008-676  mg/dL   High  >195     mg/dL   Very High Performed at [facility], [address], [place], [place] [address]    Dg Chest 2 View  Result Date: 08/24/2018 CLINICAL DATA:  Chest pain and indigestion for 3 days EXAM: CHEST - 2 VIEW COMPARISON:  None. FINDINGS: Normal heart size. Lungs clear. No pneumothorax. No pleural effusion. IMPRESSION: No active cardiopulmonary disease. Electronically Signed   By: Jolaine Click M.D.   On: 08/24/2018 10:06    Pending Labs Unresulted Labs (From admission, onward)    Start     Ordered   08/31/18 0500  Creatinine, serum  (enoxaparin (LOVENOX)    CrCl >/= 30 ml/min)  Weekly,   R    Comments:  while on enoxaparin therapy    08/24/18 1501   08/24/18 1502  Troponin I - Now Then Q6H  Now then every 6 hours,   R     08/24/18 1501   08/24/18 1502  CBC  (enoxaparin (LOVENOX)    CrCl >/= 30 ml/min)  Once,   R    Comments:  Baseline for enoxaparin therapy IF NOT ALREADY DRAWN.  Notify MD if PLT < 100 K.    08/24/18 1501  08/24/18 1502  Creatinine, serum  (enoxaparin (LOVENOX)    CrCl >/= 30 ml/min)  Once,   R    Comments:  Baseline for enoxaparin therapy IF NOT ALREADY DRAWN.    08/24/18 1501          Vitals/Pain Today's Vitals   08/24/18 1300 08/24/18 1330 08/24/18 1400 08/24/18 1430  BP: (!) 136/98 (!) 137/97 (!) 149/103 (!) 147/101  Pulse: (!) 57 (!) 56  63  Resp: 17 15 20 14   Temp:      TempSrc:      SpO2: 96% 98%  97%  Weight:      Height:      PainSc:        Isolation Precautions No active isolations  Medications Medications   amLODipine (NORVASC) tablet 5 mg (has no administration in time range)  isosorbide mononitrate (IMDUR) 24 hr tablet 30 mg (has no administration in time range)  lisinopril-hydrochlorothiazide (PRINZIDE,ZESTORETIC) 20-12.5 MG per tablet 1 tablet (has no administration in time range)  metoprolol tartrate (LOPRESSOR) tablet 12.5 mg (has no administration in time range)  nitroGLYCERIN (NITROSTAT) SL tablet 0.4 mg (has no administration in time range)  rosuvastatin (CRESTOR) tablet 5 mg (has no administration in time range)  insulin glargine (LANTUS) injection 24 Units (has no administration in time range)  pantoprazole (PROTONIX) EC tablet 40 mg (has no administration in time range)  acetaminophen (TYLENOL) tablet 650 mg (has no administration in time range)  ondansetron (ZOFRAN) injection 4 mg (has no administration in time range)  insulin aspart (novoLOG) injection 0-15 Units (has no administration in time range)  insulin aspart (novoLOG) injection 0-5 Units (has no administration in time range)  enoxaparin (LOVENOX) injection 40 mg (has no administration in time range)  morphine 2 MG/ML injection 2 mg (has no administration in time range)  alum & mag hydroxide-simeth (MAALOX/MYLANTA) 200-200-20 MG/5ML suspension 30 mL (has no administration in time range)    And  lidocaine (XYLOCAINE) 2 % viscous mouth solution 15 mL (has no administration in time range)  aspirin EC tablet 325 mg (has no administration in time range)  nitroGLYCERIN (NITROSTAT) SL tablet 0.4 mg (0.4 mg Sublingual Given 08/24/18 1034)  aspirin chewable tablet 324 mg (324 mg Oral Given 08/24/18 1034)    Mobility walks

## 2018-08-24 NOTE — ED Provider Notes (Signed)
Emergency Department Provider Note   I have reviewed the triage vital signs and the nursing notes.   HISTORY  Chief Complaint Chest Pain   HPI Gary Wheeler is a 73 y.o. male with PMH of CAD s/p PCI, HLD, and HTN presents to the emergency department with persistent left-sided chest tightness starting this morning.  Patient notes intermittent tightness over the past several days which seemed to resolve with belching.  The patient awoke this morning with left-sided chest tightness which was more intense and persistent.  It did not resolve with his sublingual nitroglycerin at home but notes that his tablets are over a year old.  He is followed primarily by the [place] in [place], [place].  He states that this chest tightness is somewhat similar to cardiac related pain that he has had in the past.  He required left heart cath and 3 stents to be placed in 2016.   Past Medical History:  Diagnosis Date  . Barrett's esophagus 2008  . CAD (coronary artery disease) 01/09/15   a. cath 01/09/15 PCI with 2 DES stents in LAD/1 in Dx with comcomitant CAD in distal LAD, [place].   . Diabetes mellitus without complication (HCC)   . Hypercholesterolemia   . Hypertension   . STEMI (ST elevation myocardial infarction) (HCC) 01/09/15    Patient Active Problem List   Diagnosis Date Noted  . Chest pain 08/24/2018  . CAD (coronary artery disease), native coronary artery 08/24/2018  . Ischemic cardiomyopathy 08/24/2018  . HLD (hyperlipidemia) 08/24/2018  . GERD (gastroesophageal reflux disease) 08/24/2018  . Encounter for screening colonoscopy 02/11/2018  . Barrett's esophagus 02/11/2018  . Acute ST elevation myocardial infarction (STEMI) involving left anterior descending coronary artery (HCC) 01/09/2015  . ST elevation myocardial infarction (STEMI) involving other coronary artery of inferior wall (HCC) 01/09/2015  . STEMI (ST elevation myocardial infarction) (HCC) 01/09/2015  . Hypertension   . IDDM  (insulin dependent diabetes mellitus) (HCC)   . Essential hypertension   . Coronary artery disease involving native coronary artery of native heart with unstable angina pectoris [facility])     Past Surgical History:  Procedure Laterality Date  . BIOPSY  03/18/2018   Procedure: BIOPSY;  Surgeon: Corbin Ade, MD;  [facility];  Service: Endoscopy;;  esophageal biopsies  . CARDIAC CATHETERIZATION N/A 01/09/2015   Procedure: Left Heart Cath and Coronary Angiography;  Surgeon: Marykay Lex, MD;  Location: [facility] INVASIVE CV LAB;  Service: Cardiovascular;  Laterality: N/A;  . CARDIAC CATHETERIZATION  01/09/2015   with 3 stents  . COLONOSCOPY  2008   Dr. Linna Darner: normal  . COLONOSCOPY N/A 03/18/2018   diverticulosis in sigmoid and descending  . ESOPHAGOGASTRODUODENOSCOPY  2008   Dr. Linna Darner: Barrett's. Path with a minute fragment of squamous cell epithelium suggestive of dysplasia  . ESOPHAGOGASTRODUODENOSCOPY N/A 03/18/2018   DR. Rourk: Barrett's esophagus, small hiatal hernia, minimal polypoid gastric mucosa,normal duodenum, surveillance in 3 years  . left ORIF ankle      Allergies Lipitor [atorvastatin] and Pravastatin  Family History  Problem Relation Age of Onset  . Coronary artery disease Father   . Coronary artery disease Paternal Grandfather   . Prostate cancer Brother   . Colon cancer Neg Hx   . Colon polyps Neg Hx     Social History Social History   Tobacco Use  . Smoking status: Current Some Day Smoker    Types: Cigars    Last attempt to quit: 07/30/1999  Years since quitting: 19.0  . Smokeless tobacco: Never Used  Substance Use Topics  . Alcohol use: Yes    Alcohol/week: 0.0 standard drinks    Comment: rare, occasional mixed drink   . Drug use: No    Review of Systems  Constitutional: No fever/chills Eyes: No visual changes. ENT: No sore throat. Cardiovascular: Positive chest pain. Respiratory: Denies shortness of breath. Gastrointestinal: No abdominal  pain.  No nausea, no vomiting.  No diarrhea.  No constipation. Genitourinary: Negative for dysuria. Musculoskeletal: Negative for back pain. Skin: Negative for rash. Neurological: Negative for headaches, focal weakness or numbness.  10-point ROS otherwise negative.  ____________________________________________   PHYSICAL EXAM:  VITAL SIGNS: ED Triage Vitals  Enc Vitals Group     BP 08/24/18 0942 (!) 170/110     Pulse Rate 08/24/18 0942 66     Resp 08/24/18 0942 20     Temp 08/24/18 0942 97.7 F (36.5 C)     Temp Source 08/24/18 0942 Oral     SpO2 08/24/18 0942 97 %     Weight 08/24/18 0939 200 lb (90.7 kg)     Height 08/24/18 0939 5\' 10"  (1.778 m)     Pain Score 08/24/18 0939 0   Constitutional: Alert and oriented. Well appearing and in no acute distress. Eyes: Conjunctivae are normal.  Head: Atraumatic. Nose: No congestion/rhinnorhea. Mouth/Throat: Mucous membranes are moist. Neck: No stridor.  Cardiovascular: Normal rate, regular rhythm. Good peripheral circulation. Grossly normal heart sounds.   Respiratory: Normal respiratory effort.  No retractions. Lungs CTAB. Gastrointestinal: Soft and nontender. No distention.  Musculoskeletal: No lower extremity tenderness nor edema. No gross deformities of extremities. Neurologic:  Normal speech and language. No gross focal neurologic deficits are appreciated.  Skin:  Skin is warm, dry and intact. No rash noted.  ____________________________________________   LABS (all labs ordered are listed, but only abnormal results are displayed)  Labs Reviewed  BASIC METABOLIC PANEL - Abnormal; Notable for the following components:      Result Value   Glucose, Bld 238 (*)    All other components within normal limits  LIPID PANEL - Abnormal; Notable for the following components:   HDL 37 (*)    All other components within normal limits  CBC WITH DIFFERENTIAL/PLATELET  TROPONIN I    ____________________________________________  EKG   EKG Interpretation  Date/Time:  Monday August 24 2018 09:43:15 EST Ventricular Rate:  64 PR Interval:    QRS Duration: 99 QT Interval:  403 QTC Calculation: 416 R Axis:   27 Text Interpretation:  Sinus rhythm Low voltage, precordial leads Abnormal R-wave progression, early transition No STEMI.  Confirmed by Alona Bene [phone]) on 08/24/2018 9:48:01 AM       ____________________________________________  RADIOLOGY  Dg Chest 2 View  Result Date: 08/24/2018 CLINICAL DATA:  Chest pain and indigestion for 3 days EXAM: CHEST - 2 VIEW COMPARISON:  None. FINDINGS: Normal heart size. Lungs clear. No pneumothorax. No pleural effusion. IMPRESSION: No active cardiopulmonary disease. Electronically Signed   By: Jolaine Click M.D.   On: 08/24/2018 10:06    ____________________________________________   PROCEDURES  Procedure(s) performed:   Procedures  None ____________________________________________   INITIAL IMPRESSION / ASSESSMENT AND PLAN / ED COURSE  Pertinent labs & imaging results that were available during my care of the patient were reviewed by me and considered in my medical decision making (see chart for details).  Patient with known history of CAD presents to the emergency department with persistent chest  tightness.  No resolution of pain with his nitroglycerin at home.  He states that his tightness here has basically resolved.  His EKG shows no acute ischemia.  Reviewed his last heart cath from 2016 which required 3 stents to be placed at that time.  He had additional areas of narrowing which could not be stented.  Plan for screening labs, chest x-ray and likely observational admission given his prior history.   Patient high risk for ACS. Labs including troponin are negative. CXR negative. Plan for admit for further risk stratification. Consulted Cardiology.   Discussed patient's case with Hospitalist to request  admission. Patient and family (if present) updated with plan. [facility].  I reviewed all nursing notes, vitals, pertinent old records, EKGs, labs, imaging (as available).   ____________________________________________  FINAL CLINICAL IMPRESSION(S) / ED DIAGNOSES  Final diagnoses:  Precordial chest pain     MEDICATIONS GIVEN DURING THIS VISIT:  Medications  nitroGLYCERIN (NITROSTAT) SL tablet 0.4 mg (0.4 mg Sublingual Given 08/24/18 1034)  aspirin chewable tablet 324 mg (324 mg Oral Given 08/24/18 1034)    Note:  This document was prepared using Dragon voice recognition software and may include unintentional dictation errors.  Alona BeneJoshua Arvo Ealy, MD Emergency Medicine    Erienne Spelman, Arlyss RepressJoshua G, MD 08/24/18 1435

## 2018-08-24 NOTE — H&P (Signed)
History and Physical    Gary AquasFairley W Rosemeyer ZOX:[id number] DOB: 11-06-1945 DOA: 08/24/2018  [facility]  Patient coming from: Home  I have personally briefly reviewed [facility]  Chief Complaint: Chest discomfort  HPI: Gary Wheeler is a 73 y.o. male with medical history significant of coronary artery disease with MI in June 2016 where he underwent stent placement.  EF at that time was noted to be 45 to 50%.  Patient presents to the hospital with complaints of left-sided chest discomfort, which initially alleviated with belching.  This occurred at night prior to him sleeping.  This morning, he had continued left-sided chest discomfort.  He took aspirin and nitroglycerin which helped improved his pain.  He did not have any shortness of breath, nausea, dizziness, palpitations, lightheadedness.  ED Course: EKG did not show any acute findings.  Initial troponin was negative.  With patient's significant cardiac history, there was concern for underlying angina.  He has been referred for admission.  Review of Systems: As per HPI otherwise 10 point review of systems negative.    Past Medical History:  Diagnosis Date  . Barrett's esophagus 2008  . CAD (coronary artery disease) 01/09/15   a. cath 01/09/15 PCI with 2 DES stents in LAD/1 in Dx with comcomitant CAD in distal LAD, [place].   . Diabetes mellitus without complication (HCC)   . Hypercholesterolemia   . Hypertension   . STEMI (ST elevation myocardial infarction) (HCC) 01/09/15    Past Surgical History:  Procedure Laterality Date  . BIOPSY  03/18/2018   Procedure: BIOPSY;  Surgeon: Corbin Adeourk, Robert M, MD;  [facility];  Service: Endoscopy;;  esophageal biopsies  . CARDIAC CATHETERIZATION N/A 01/09/2015   Procedure: Left Heart Cath and Coronary Angiography;  Surgeon: Marykay Lexavid W Harding, MD;  [facility];  Service: Cardiovascular;  Laterality: N/A;  . CARDIAC  CATHETERIZATION  01/09/2015   with 3 stents  . COLONOSCOPY  2008   Dr. Linna DarnerAnwar: normal  . COLONOSCOPY N/A 03/18/2018   diverticulosis in sigmoid and descending  . ESOPHAGOGASTRODUODENOSCOPY  2008   Dr. Linna DarnerAnwar: Barrett's. Path with a minute fragment of squamous cell epithelium suggestive of dysplasia  . ESOPHAGOGASTRODUODENOSCOPY N/A 03/18/2018   DR. Rourk: Barrett's esophagus, small hiatal hernia, minimal polypoid gastric mucosa,normal duodenum, surveillance in 3 years  . left ORIF ankle      Social History:  reports that he has been smoking cigars. He has never used smokeless tobacco. He reports current alcohol use. He reports that he does not use drugs.  Allergies  Allergen Reactions  . Lipitor [Atorvastatin] Diarrhea  . Pravastatin Diarrhea    Family History  Problem Relation Age of Onset  . Coronary artery disease Father   . Coronary artery disease Paternal Grandfather   . Prostate cancer Brother   . Colon cancer Neg Hx   . Colon polyps Neg Hx      Prior to Admission medications   Medication Sig Start Date End Date Taking? Authorizing Provider  amLODipine (NORVASC) 10 MG tablet Take 5 mg by mouth daily.    Yes [provider]  aspirin EC 81 MG tablet Take 81 mg by mouth daily.   Yes [provider]  cholecalciferol (VITAMIN D) 1000 UNITS tablet Take 4,000 Units by mouth daily.    Yes [provider]  insulin aspart (NOVOLOG FLEXPEN) 100 UNIT/ML FlexPen Inject 10-20 Units into the skin 3 (three) times daily  with meals. Per sliding scale   Yes [provider]  insulin glargine (LANTUS) 100 UNIT/ML injection Inject 24 Units into the skin daily.   Yes [provider]  isosorbide mononitrate (IMDUR) 30 MG 24 hr tablet Take 1 tablet (30 mg total) by mouth daily. 01/11/15  Yes Bhagat, Bhavinkumar, PA  ketotifen (ZADITOR) 0.025 % ophthalmic solution Place 1 drop into both eyes 2 (two) times daily as needed (for dry eyes).   Yes [provider]  lisinopril-hydrochlorothiazide (PRINZIDE,ZESTORETIC) 20-12.5 MG per tablet Take 1 tablet by mouth 2 (two) times daily.    Yes [provider]  loratadine (CLARITIN) 10 MG tablet Take 10 mg by mouth daily as needed for allergies.   Yes [provider]  metFORMIN (GLUCOPHAGE) 500 MG tablet Take 500 mg by mouth 2 (two) times daily with a meal.   Yes [provider]  metoprolol tartrate (LOPRESSOR) 25 MG tablet Take 12.5 mg by mouth 2 (two) times daily.   Yes [provider]  nitroGLYCERIN (NITROSTAT) 0.4 MG SL tablet Place 1 tablet (0.4 mg total) under the tongue every 5 (five) minutes x 3 doses as needed for chest pain. 01/11/15  Yes Bhagat, Bhavinkumar, PA  omeprazole (PRILOSEC) 20 MG capsule Take 20 mg by mouth daily.   Yes [provider]  potassium chloride (K-DUR,KLOR-CON) 10 MEQ tablet Take 10 mEq by mouth daily as needed (for cramping).   Yes [provider]  rosuvastatin (CRESTOR) 5 MG tablet Take 5 mg by mouth at bedtime.    Yes [provider]  vitamin B-12 (CYANOCOBALAMIN) 1000 MCG tablet Take 2,000 mcg by mouth daily.    Yes [provider]    Physical Exam: Vitals:   08/24/18 1130 08/24/18 1200 08/24/18 1230 08/24/18 1300  BP: (!) 148/94 (!) 145/99 (!) 140/98 (!) 136/98  Pulse: 62 (!) 56 70 (!) 57  Resp: 17 13 20 17   Temp:      TempSrc:      SpO2: 95% 100% 97% 96%  Weight:      Height:        Constitutional: NAD, calm, comfortable Eyes: PERRL, lids and conjunctivae normal ENMT: Mucous membranes are moist. Posterior pharynx clear of any exudate or lesions.Normal dentition.  Neck: normal, supple, no masses, no thyromegaly Respiratory: clear to auscultation bilaterally, no wheezing, no crackles. Normal respiratory effort. No accessory muscle use.  Cardiovascular: Regular rate and rhythm, no murmurs / rubs / gallops. No extremity edema. 2+ pedal pulses. No carotid bruits.  Abdomen: no  tenderness, no masses palpated. No hepatosplenomegaly. Bowel sounds positive.  Musculoskeletal: no clubbing / cyanosis. No joint deformity upper and lower extremities. Good ROM, no contractures. Normal muscle tone.  Skin: no rashes, lesions, ulcers. No induration Neurologic: CN 2-12 grossly intact. Sensation intact, DTR normal. Strength 5/5 in all 4.  Psychiatric: Normal judgment and insight. Alert and oriented x 3. Normal mood.    Labs on Admission: I have personally reviewed following labs and imaging studies  CBC: Recent Labs  Lab 08/24/18 0946  WBC 7.1  NEUTROABS 4.6  HGB 14.5  HCT 42.9  MCV 85.0  PLT 268   Basic Metabolic Panel: Recent Labs  Lab 08/24/18 0946  NA 137  K 3.8  CL 103  CO2 25  GLUCOSE 238*  BUN 17  CREATININE 0.74  CALCIUM 9.1   GFR: Estimated Creatinine Clearance: 94.6 mL/min (by C-G formula based on SCr of 0.74 mg/dL). Liver Function Tests: No results for input(s):  AST, ALT, ALKPHOS, BILITOT, PROT, ALBUMIN in the last 168 hours. No results for input(s): LIPASE, AMYLASE in the last 168 hours. No results for input(s): AMMONIA in the last 168 hours. Coagulation Profile: No results for input(s): INR, PROTIME in the last 168 hours. Cardiac Enzymes: Recent Labs  Lab 08/24/18 0946  TROPONINI <0.03   BNP (last 3 results) No results for input(s): PROBNP in the last 8760 hours. HbA1C: No results for input(s): HGBA1C in the last 72 hours. CBG: No results for input(s): GLUCAP in the last 168 hours. Lipid Profile: Recent Labs    08/24/18 0946  CHOL 111  HDL 37*  LDLCALC 60  TRIG 68  CHOLHDL 3.0   Thyroid Function Tests: No results for input(s): TSH, T4TOTAL, FREET4, T3FREE, THYROIDAB in the last 72 hours. Anemia Panel: No results for input(s): VITAMINB12, FOLATE, FERRITIN, TIBC, IRON, RETICCTPCT in the last 72 hours. Urine analysis: No results found for: COLORURINE, APPEARANCEUR, LABSPEC, PHURINE, GLUCOSEU, HGBUR, BILIRUBINUR, KETONESUR,  PROTEINUR, UROBILINOGEN, NITRITE, LEUKOCYTESUR  Radiological Exams on Admission: Dg Chest 2 View  Result Date: 08/24/2018 CLINICAL DATA:  Chest pain and indigestion for 3 days EXAM: CHEST - 2 VIEW COMPARISON:  None. FINDINGS: Normal heart size. Lungs clear. No pneumothorax. No pleural effusion. IMPRESSION: No active cardiopulmonary disease. Electronically Signed   By: Jolaine ClickArthur  Hoss M.D.   On: 08/24/2018 10:06    EKG: Independently reviewed.  Sinus rhythm without acute ST-3 changes  Assessment/Plan Active Problems:   IDDM (insulin dependent diabetes mellitus) (HCC)   Essential hypertension   Chest pain   CAD (coronary artery disease), native coronary artery   Ischemic cardiomyopathy   HLD (hyperlipidemia)   GERD (gastroesophageal reflux disease)     1. Chest pain with concern for underlying angina.  Seen by cardiology.  Patient will be admitted to the hospital overnight for observation.  Will monitor on telemetry.  Cycle cardiac markers.  If patient rules out for ACS, will likely get a stress test in the morning.  If troponins become elevated, will need cardiac catheterization.  Continue on aspirin.  Continue on beta-blockers and statin. 2. Ischemic cardiomyopathy.  Last EF of 45 to 50% on cardiac cath.  Echo will be repeated during this hospitalization. 3. Insulin-dependent diabetes.  Continue on Lantus.  Will supplement with sliding scale insulin. 4. Hypertension.  Continue on home regimen of beta-blockers, amlodipine and lisinopril/hydrochlorothiazide.  He is also on Imdur which will be continued. 5. Hyperlipidemia.  Continue statin 6. GERD.  Continue on PPI  DVT prophylaxis: Lovenox Code Status: Full code Family Communication: No family present Disposition Plan: Pending further cardiac work-up, likely discharge home in the next 24 hours Consults called: Cardiology Admission status: Observation, telemetry  Erick BlinksJehanzeb Otho Michalik MD [facility]   If 7PM-7AM, please contact  night-coverage [email or web address]   08/24/2018, 1:50 PM

## 2018-08-24 NOTE — ED Triage Notes (Signed)
Pt reports woke up with left sided chest pain.  Reports pain comes and goes.  Pt says took 1 nitro without relief this morning.

## 2018-08-25 ENCOUNTER — Observation Stay (HOSPITAL_BASED_OUTPATIENT_CLINIC_OR_DEPARTMENT_OTHER): Payer: Medicare Other

## 2018-08-25 DIAGNOSIS — E785 Hyperlipidemia, unspecified: Secondary | ICD-10-CM | POA: Diagnosis not present

## 2018-08-25 DIAGNOSIS — I255 Ischemic cardiomyopathy: Secondary | ICD-10-CM | POA: Diagnosis not present

## 2018-08-25 DIAGNOSIS — I10 Essential (primary) hypertension: Secondary | ICD-10-CM

## 2018-08-25 DIAGNOSIS — I25118 Atherosclerotic heart disease of native coronary artery with other forms of angina pectoris: Secondary | ICD-10-CM

## 2018-08-25 DIAGNOSIS — R072 Precordial pain: Secondary | ICD-10-CM

## 2018-08-25 DIAGNOSIS — R079 Chest pain, unspecified: Secondary | ICD-10-CM | POA: Diagnosis not present

## 2018-08-25 DIAGNOSIS — K219 Gastro-esophageal reflux disease without esophagitis: Secondary | ICD-10-CM | POA: Diagnosis not present

## 2018-08-25 DIAGNOSIS — E119 Type 2 diabetes mellitus without complications: Secondary | ICD-10-CM

## 2018-08-25 DIAGNOSIS — Z794 Long term (current) use of insulin: Secondary | ICD-10-CM

## 2018-08-25 LAB — ECHOCARDIOGRAM COMPLETE
Height: 70 in
Weight: 3200 oz

## 2018-08-25 LAB — GLUCOSE, CAPILLARY
Glucose-Capillary: 168 mg/dL — ABNORMAL HIGH (ref 70–99)
Glucose-Capillary: 161 mg/dL — ABNORMAL HIGH (ref 70–99)

## 2018-08-25 LAB — TROPONIN I: Troponin I: <0.03 ng/mL (ref <0.03)

## 2018-08-25 NOTE — Care Management Obs Status (Signed)
MEDICARE OBSERVATION STATUS NOTIFICATION   Patient Details  Name: XSAVIOR AUPPERLE MRN: [id number] Date of Birth: 05/17/1946   Medicare Observation Status Notification Given:  Yes    Malcolm Metro, RN 08/25/2018, 9:14 AM

## 2018-08-25 NOTE — Discharge Summary (Signed)
Physician Discharge Summary  Gary Wheeler W Beckner ZOX:[id number] DOB: 08/09/1945 DOA: 08/24/2018  [facility]  Admit date: 08/24/2018 Discharge date: 08/25/2018  Admitted From: Home Disposition: Home  Recommendations for Outpatient Follow-up:  1. Follow up with PCP in 1-2 weeks 2. Please obtain BMP/CBC in one week 3. [place] [facility] [place] 4. Can consider increasing Imdur to 60 mg daily if chest pain recurs.  Discharge Condition: Stable CODE STATUS: Full code Diet recommendation: Heart healthy, carb modified  Brief/Interim Summary: 73 year old male with a history of coronary artery disease, hypertension, diabetes, presents to the hospital with complaints of left-sided chest discomfort.  He was admitted to the hospital and ruled out for ACS with negative cardiac markers.  He did not have any acute EKG changes.  Patient did not have any recurrence of pain since admission.  He is feeling better.  Echocardiogram was done and was found to be unremarkable.  He was seen by cardiology who felt it was appropriate for patient to follow-up with his primary cardiologist as an outpatient to discuss further stress testing.  Patient is agreeable to this plan.  No changes in medication made at this time.  Potentially, his Imdur can be increased to 60 mg daily if chest pain recurs.  Patient is feeling ready for discharge home.  Discharge Diagnoses:  Active Problems:   IDDM (insulin dependent diabetes mellitus) (HCC)   Essential hypertension   Chest pain   CAD (coronary artery disease), native coronary artery   Ischemic cardiomyopathy   HLD (hyperlipidemia)   GERD (gastroesophageal reflux disease)    Discharge Instructions  Discharge Instructions    Diet - low sodium heart healthy   Complete by:  As directed    Increase activity slowly   Complete by:  As directed      Allergies as of 08/25/2018      Reactions   Lipitor [atorvastatin] Diarrhea   Pravastatin Diarrhea      Medication List    TAKE these medications   amLODipine 10 MG tablet Commonly known as:  NORVASC Take 5 mg by mouth daily.   aspirin EC 81 MG tablet Take 81 mg by mouth daily.   cholecalciferol 1000 units tablet Commonly known as:  VITAMIN D Take 4,000 Units by mouth daily.   insulin glargine 100 UNIT/ML injection Commonly known as:  LANTUS Inject 24 Units into the skin daily.   isosorbide mononitrate 30 MG 24 hr tablet Commonly known as:  IMDUR Take 1 tablet (30 mg total) by mouth daily.   ketotifen 0.025 % ophthalmic solution Commonly known as:  ZADITOR Place 1 drop into both eyes 2 (two) times daily as needed (for dry eyes).   lisinopril-hydrochlorothiazide 20-12.5 MG tablet Commonly known as:  PRINZIDE,ZESTORETIC Take 1 tablet by mouth 2 (two) times daily.   loratadine 10 MG tablet Commonly known as:  CLARITIN Take 10 mg by mouth daily as needed for allergies.   metFORMIN 500 MG tablet Commonly known as:  GLUCOPHAGE Take 500 mg by mouth 2 (two) times daily with a meal.   metoprolol tartrate 25 MG tablet Commonly known as:  LOPRESSOR Take 12.5 mg by mouth 2 (two) times daily.   nitroGLYCERIN 0.4 MG SL tablet Commonly known as:  NITROSTAT Place 1 tablet (0.4 mg total) under the tongue every 5 (five) minutes x 3 doses as needed for chest pain.   NOVOLOG FLEXPEN 100 UNIT/ML FlexPen Generic drug:  insulin aspart Inject 10-20 Units into the skin  3 (three) times daily with meals. Per sliding scale   omeprazole 20 MG capsule Commonly known as:  PRILOSEC Take 20 mg by mouth daily.   potassium chloride 10 MEQ tablet Commonly known as:  K-DUR,KLOR-CON Take 10 mEq by mouth daily as needed (for cramping).   rosuvastatin 5 MG tablet Commonly known as:  CRESTOR Take 5 mg by mouth at bedtime.   vitamin B-12 1000 MCG tablet Commonly known as:  CYANOCOBALAMIN Take 2,000 mcg by mouth daily.       Allergies  Allergen Reactions  .  Lipitor [Atorvastatin] Diarrhea  . Pravastatin Diarrhea    Consultations:  Cardiology   Procedures/Studies: Dg Chest 2 View  Result Date: 08/24/2018 CLINICAL DATA:  Chest pain and indigestion for 3 days EXAM: CHEST - 2 VIEW COMPARISON:  None. FINDINGS: Normal heart size. Lungs clear. No pneumothorax. No pleural effusion. IMPRESSION: No active cardiopulmonary disease. Electronically Signed   By: Jolaine ClickArthur  Hoss M.D.   On: 08/24/2018 10:06      Subjective: No further chest pain, no shortness of breath, wants to go home  Discharge Exam: Vitals:   08/24/18 2126 08/25/18 0617 08/25/18 0947 08/25/18 1000  BP: (!) 143/103 (!) 120/96  (!) 133/93  Pulse: 64 (!) 58 62 (!) 54  Resp:    18  Temp: 98 F (36.7 C) 97.8 F (36.6 C)  (!) 97.5 F (36.4 C)  TempSrc: Oral Oral  Oral  SpO2: 96% 96%  97%  Weight:      Height:        General: Pt is alert, awake, not in acute distress Cardiovascular: RRR, S1/S2 +, no rubs, no gallops Respiratory: CTA bilaterally, no wheezing, no rhonchi Abdominal: Soft, NT, ND, bowel sounds + Extremities: no edema, no cyanosis    The results of significant diagnostics from this hospitalization (including imaging, microbiology, ancillary and laboratory) are listed below for reference.     Microbiology: No results found for this or any previous visit (from the past 240 hour(s)).   Labs: BNP (last 3 results) No results for input(s): BNP in the last 8760 hours. Basic Metabolic Panel: Recent Labs  Lab 08/24/18 0946  NA 137  K 3.8  CL 103  CO2 25  GLUCOSE 238*  BUN 17  CREATININE 0.74  CALCIUM 9.1   Liver Function Tests: No results for input(s): AST, ALT, ALKPHOS, BILITOT, PROT, ALBUMIN in the last 168 hours. No results for input(s): LIPASE, AMYLASE in the last 168 hours. No results for input(s): AMMONIA in the last 168 hours. CBC: Recent Labs  Lab 08/24/18 0946  WBC 7.1  NEUTROABS 4.6  HGB 14.5  HCT 42.9  MCV 85.0  PLT 268   Cardiac  Enzymes: Recent Labs  Lab 08/24/18 0946 08/24/18 1536 08/24/18 2120 08/25/18 0316  TROPONINI <0.03 <0.03 <0.03 <0.03   BNP: Invalid input(s): POCBNP CBG: Recent Labs  Lab 08/24/18 1545 08/24/18 1810 08/24/18 2127 08/25/18 0717 08/25/18 1119  GLUCAP 249* 202* 203* 168* 161*   D-Dimer No results for input(s): DDIMER in the last 72 hours. Hgb A1c No results for input(s): HGBA1C in the last 72 hours. Lipid Profile Recent Labs    08/24/18 0946  CHOL 111  HDL 37*  LDLCALC 60  TRIG 68  CHOLHDL 3.0   Thyroid function studies No results for input(s): TSH, T4TOTAL, T3FREE, THYROIDAB in the last 72 hours.  Invalid input(s): FREET3 Anemia work up No results for input(s): VITAMINB12, FOLATE, FERRITIN, TIBC, IRON, RETICCTPCT in the last 72 hours.  Urinalysis No results found for: COLORURINE, APPEARANCEUR, LABSPEC, PHURINE, GLUCOSEU, HGBUR, BILIRUBINUR, KETONESUR, PROTEINUR, UROBILINOGEN, NITRITE, LEUKOCYTESUR Sepsis Labs Invalid input(s): PROCALCITONIN,  WBC,  LACTICIDVEN Microbiology No results found for this or any previous visit (from the past 240 hour(s)).   Time coordinating discharge:  SIGNED:   Erick Blinks, MD  [facility] 08/25/2018, 12:18 PM   If 7PM-7AM, please contact night-coverage [email or web address]

## 2018-08-25 NOTE — Progress Notes (Signed)
Pt discharged to home in stable condition.  All discharge instructions reviewed with and given to pt.  RAC PIV removed with catheter tip intact, without difficulty.  Pt transported off unit via wheelchair with transporters x1 at chair side.  [id number]

## 2018-08-25 NOTE — Progress Notes (Signed)
Progress Note  Patient Name: Gary Wheeler Date of Encounter: 08/25/2018  Primary Cardiologist: [facility]  Subjective   He is feeling well this morning and denies any recurrence of chest pain.  He also denies shortness of breath or palpitations.  Inpatient Medications    Scheduled Meds: . amLODipine  5 mg Oral Daily  . aspirin EC  325 mg Oral Daily  . enoxaparin (LOVENOX) injection  40 mg Subcutaneous Q24H  . lisinopril  20 mg Oral BID   And  . hydrochlorothiazide  12.5 mg Oral BID  . Influenza vac split quadrivalent PF  0.5 mL Intramuscular Tomorrow-1000  . insulin aspart  0-15 Units Subcutaneous TID WC  . insulin aspart  0-5 Units Subcutaneous QHS  . insulin glargine  24 Units Subcutaneous Daily  . isosorbide mononitrate  30 mg Oral Daily  . metoprolol tartrate  12.5 mg Oral BID  . pantoprazole  40 mg Oral Daily  . rosuvastatin  5 mg Oral QHS   Continuous Infusions:  PRN Meds: acetaminophen, morphine injection, nitroGLYCERIN, ondansetron (ZOFRAN) IV   Vital Signs    Vitals:   08/24/18 2100 08/24/18 2126 08/25/18 0617 08/25/18 0947  BP:  (!) 143/103 (!) 120/96   Pulse:  64 (!) 58 62  Resp:      Temp:  98 F (36.7 C) 97.8 F (36.6 C)   TempSrc:  Oral Oral   SpO2: 93% 96% 96%   Weight:      Height:       No intake or output data in the 24 hours ending 08/25/18 1022 Filed Weights   08/24/18 0939  Weight: 90.7 kg    Telemetry    Sinus bradycardia- Personally Reviewed  ECG    No new tracings- Personally Reviewed  Physical Exam   GEN: No acute distress.   Neck: No JVD Cardiac:  Bradycardic, regular rhythm, no murmurs, rubs, or gallops.  Respiratory: Clear to auscultation bilaterally. GI: Soft, nontender, non-distended  MS: No edema; No deformity. Neuro:  Nonfocal  Psych: Normal affect   Labs    Chemistry Recent Labs  Lab 08/24/18 0946  NA 137  K 3.8  CL 103  CO2 25  GLUCOSE 238*  BUN 17  CREATININE 0.74  CALCIUM 9.1    GFRNONAA >60  GFRAA >60  ANIONGAP 9     Hematology Recent Labs  Lab 08/24/18 0946  WBC 7.1  RBC 5.05  HGB 14.5  HCT 42.9  MCV 85.0  MCH 28.7  MCHC 33.8  RDW 12.4  PLT 268    Cardiac Enzymes Recent Labs  Lab 08/24/18 0946 08/24/18 1536 08/24/18 2120 08/25/18 0316  TROPONINI <0.03 <0.03 <0.03 <0.03   No results for input(s): TROPIPOC in the last 168 hours.   BNPNo results for input(s): BNP, PROBNP in the last 168 hours.   DDimer No results for input(s): DDIMER in the last 168 hours.   Radiology    Dg Chest 2 View  Result Date: 08/24/2018 CLINICAL DATA:  Chest pain and indigestion for 3 days EXAM: CHEST - 2 VIEW COMPARISON:  None. FINDINGS: Normal heart size. Lungs clear. No pneumothorax. No pleural effusion. IMPRESSION: No active cardiopulmonary disease. Electronically Signed   By: Jolaine Click M.D.   On: 08/24/2018 10:06    Cardiac Studies   Echocardiogram 08/25/2018:  Study Conclusions  - Left ventricle: Mild basal septal hypertrophy. Systolic function   was normal. The estimated ejection fraction was in the range of  55% to 60%. Doppler parameters are consistent with abnormal left   ventricular relaxation (grade 1 diastolic dysfunction). - Regional wall motion abnormality: Mild hypokinesis of the basal   inferior myocardium.  Impressions:  - LVEF has normalized since prior study in 2016.    Cardiac catheterization 01/09/2015: 1. Mid LAD to Dist LAD lesion, 95% stenosed. 2 overlapping drug-eluting stents (Xience Alpine 2.5 mm x 18 mm, 2.5 mm x 8 mm - postdilated to 3.25 mm proximally and 2.7 mm distally) were placed. There is a 0% residual stenosis post intervention. 2. Ost 2nd Diag to 2nd Diag lesion, 90% stenosed. A Xience Alpine 2.25 mm x 12 mm drug-eluting stent was placed - mini crush technique with the LAD stent. There is a 0% residual stenosis post intervention. 3. Dist LAD lesion, 99% stenosed due to distal thrombus embolization. Also mild  residual wire related dissection in the distal D2 with distal embolus of thrombus 4. Ramus lesion, 70% stenosed. Consider medical management for now. 5. Proximal 3rd RPLB-1 lesion, 65% stenosed. Inferior branch of distal 3rd RPLB-2 lesion, 90% stenosed. Likely too far distal for PCI. Consider medical management for now. 6. Mid LAD lesion, 40% stenosed. 7. There is mild left ventricular systolic dysfunction. With distal anterior and inferoapical hypokinesis. This would likely explain the inferior ST elevations.  Multivessel disease with downstream disease in the distal LAD and RPL system as well as moderate caliber ramus intermedius lesion. Successful complex bifurcation stenting of the mid LAD/D2 with mini crush technique and kissing balloon stent post-dilation.  Recommendations:  Standard radial band removal post PCI  Will continue Aggrastat for 12 hours post PCI for the distal thrombus embolization and D2 distal dissection  Will run IV nitroglycerin for blood pressure and post PCI residual angina  Restart home blood pressure medications  Sliding scale insulin with glipizide. Hold metformin 48 hours. He will likely need aggressive glycemic control.  Patient Profile     73 y.o. male with a hx of coronary artery disease, hypertension, type 2 diabetes mellitus, and hyperlipidemia who is being seen today for the evaluation of chest pain at the request of Dr. Jacqulyn Bath.  Assessment & Plan    1.  Chest pain: Symptoms have not recurred since taking 2 nitroglycerin tablets and 4 ASA 81 mg on 08/24/2018.  He ruled out for an acute coronary syndrome with normal serial troponins.  ECG is without acute ischemic changes.   Echocardiogram performed today reviewed above with normal left ventricular systolic function, EF 55 to 60%. Continue home medications which include aspirin 81 mg, Imdur 30 mg, metoprolol 12.5 mg twice daily, and rosuvastatin 5 mg.  If he has frequent recurrence of chest pain, Imdur  can always be increased to 60 mg daily.  2.  Coronary artery disease: See discussion in #1. Cath results reviewed above.   Left ventricular systolic function has normalized by echocardiogram performed today.  3.  Hypertension: Diastolic blood pressure is mildly elevated.  He will need to follow-up with his PCP for consideration of advancement of antihypertensive therapy.  4.  Hyperlipidemia: Currently on rosuvastatin 5 mg.  LDL 60 on 08/24/2018.  5.  Ischemic cardiomyopathy: LVEF 45 to 50% in June 2016.  It has since normalized as demonstrated by echocardiogram performed today, LVEF now 55 to 60%.   [facility] will sign off.   Medication Recommendations:  As above Other recommendations (labs, testing, etc):  None Follow up as an outpatient:  With cardiologist in [place], [place].  For questions or  updates, please contact [facility] Please consult [email or web address] for contact info under Cardiology/STEMI.      Signed, Prentice DockerSuresh Nehemiah Mcfarren, MD  08/25/2018, 10:22 AM

## 2018-08-25 NOTE — Progress Notes (Signed)
*  PRELIMINARY RESULTS* Echocardiogram 2D Echocardiogram has been performed.  [name] 08/25/2018, 10:10 AM

## 2018-08-30 ENCOUNTER — Other Ambulatory Visit: Payer: Self-pay

## 2018-08-30 ENCOUNTER — Inpatient Hospital Stay (HOSPITAL_COMMUNITY)
Admission: EM | Admit: 2018-08-30 | Discharge: 2018-09-02 | DRG: 247 | Disposition: A | Discharge status: Home / Self Care | Payer: No Typology Code available for payment source | Attending: Internal Medicine | Admitting: Internal Medicine

## 2018-08-30 ENCOUNTER — Encounter (HOSPITAL_COMMUNITY): Payer: Self-pay | Admitting: Emergency Medicine

## 2018-08-30 DIAGNOSIS — E1165 Type 2 diabetes mellitus with hyperglycemia: Secondary | ICD-10-CM | POA: Diagnosis not present

## 2018-08-30 DIAGNOSIS — E782 Mixed hyperlipidemia: Secondary | ICD-10-CM | POA: Diagnosis not present

## 2018-08-30 DIAGNOSIS — I252 Old myocardial infarction: Secondary | ICD-10-CM

## 2018-08-30 DIAGNOSIS — Z955 Presence of coronary angioplasty implant and graft: Secondary | ICD-10-CM

## 2018-08-30 DIAGNOSIS — I2510 Atherosclerotic heart disease of native coronary artery without angina pectoris: Secondary | ICD-10-CM

## 2018-08-30 DIAGNOSIS — E785 Hyperlipidemia, unspecified: Secondary | ICD-10-CM | POA: Diagnosis present

## 2018-08-30 DIAGNOSIS — Z8249 Family history of ischemic heart disease and other diseases of the circulatory system: Secondary | ICD-10-CM

## 2018-08-30 DIAGNOSIS — I214 Non-ST elevation (NSTEMI) myocardial infarction: Principal | ICD-10-CM | POA: Diagnosis present

## 2018-08-30 DIAGNOSIS — F17290 Nicotine dependence, other tobacco product, uncomplicated: Secondary | ICD-10-CM | POA: Diagnosis present

## 2018-08-30 DIAGNOSIS — K2270 Barrett's esophagus without dysplasia: Secondary | ICD-10-CM | POA: Diagnosis not present

## 2018-08-30 DIAGNOSIS — K219 Gastro-esophageal reflux disease without esophagitis: Secondary | ICD-10-CM | POA: Diagnosis present

## 2018-08-30 DIAGNOSIS — R0789 Other chest pain: Secondary | ICD-10-CM | POA: Diagnosis not present

## 2018-08-30 DIAGNOSIS — I255 Ischemic cardiomyopathy: Secondary | ICD-10-CM | POA: Diagnosis not present

## 2018-08-30 DIAGNOSIS — I209 Angina pectoris, unspecified: Secondary | ICD-10-CM

## 2018-08-30 DIAGNOSIS — Z794 Long term (current) use of insulin: Secondary | ICD-10-CM | POA: Diagnosis not present

## 2018-08-30 DIAGNOSIS — Z888 Allergy status to other drugs, medicaments and biological substances status: Secondary | ICD-10-CM | POA: Diagnosis not present

## 2018-08-30 DIAGNOSIS — I25110 Atherosclerotic heart disease of native coronary artery with unstable angina pectoris: Secondary | ICD-10-CM | POA: Diagnosis present

## 2018-08-30 DIAGNOSIS — I10 Essential (primary) hypertension: Secondary | ICD-10-CM | POA: Diagnosis present

## 2018-08-30 DIAGNOSIS — Z7982 Long term (current) use of aspirin: Secondary | ICD-10-CM | POA: Diagnosis not present

## 2018-08-30 DIAGNOSIS — I200 Unstable angina: Secondary | ICD-10-CM | POA: Diagnosis not present

## 2018-08-30 DIAGNOSIS — R079 Chest pain, unspecified: Secondary | ICD-10-CM | POA: Diagnosis not present

## 2018-08-30 DIAGNOSIS — I25119 Atherosclerotic heart disease of native coronary artery with unspecified angina pectoris: Secondary | ICD-10-CM | POA: Diagnosis not present

## 2018-08-30 DIAGNOSIS — Z79899 Other long term (current) drug therapy: Secondary | ICD-10-CM

## 2018-08-30 DIAGNOSIS — E119 Type 2 diabetes mellitus without complications: Secondary | ICD-10-CM

## 2018-08-30 DIAGNOSIS — I119 Hypertensive heart disease without heart failure: Secondary | ICD-10-CM | POA: Diagnosis not present

## 2018-08-30 HISTORY — DX: Other complications of anesthesia, initial encounter: T88.59XA

## 2018-08-30 HISTORY — DX: Adverse effect of unspecified anesthetic, initial encounter: T41.45XA

## 2018-08-30 LAB — CBC
HCT: 41.4 % (ref 39.0–52.0)
Hemoglobin: 13.6 g/dL (ref 13.0–17.0)
MCH: 28 pg (ref 26.0–34.0)
MCHC: 32.9 g/dL (ref 30.0–36.0)
MCV: 85.2 fL (ref 80.0–100.0)
Platelets: 241 10*3/uL (ref 150–400)
RBC: 4.86 MIL/uL (ref 4.22–5.81)
RDW: 12.7 % (ref 11.5–15.5)
WBC: 9.6 10*3/uL (ref 4.0–10.5)
nRBC: 0 % (ref 0.0–0.2)

## 2018-08-30 LAB — GLUCOSE, CAPILLARY
GLUCOSE-CAPILLARY: 237 mg/dL — AB (ref 70–99)
Glucose-Capillary: 244 mg/dL — ABNORMAL HIGH (ref 70–99)

## 2018-08-30 LAB — TROPONIN I
TROPONIN I: 0.03 ng/mL — AB (ref <0.03)
Troponin I: 0.1 ng/mL (ref <0.03)
Troponin I: 0.14 ng/mL (ref <0.03)
Troponin I: 0.14 ng/mL (ref <0.03)
Troponin I: 0.11 ng/mL (ref <0.03)

## 2018-08-30 LAB — CBG MONITORING, ED
Glucose-Capillary: 302 mg/dL — ABNORMAL HIGH (ref 70–99)
Glucose-Capillary: 274 mg/dL — ABNORMAL HIGH (ref 70–99)
Glucose-Capillary: 260 mg/dL — ABNORMAL HIGH (ref 70–99)

## 2018-08-30 LAB — BASIC METABOLIC PANEL
Anion gap: 9 (ref 5–15)
BUN: 16 mg/dL (ref 8–23)
CO2: 27 mmol/L (ref 22–32)
CREATININE: 0.78 mg/dL (ref 0.61–1.24)
Calcium: 9.4 mg/dL (ref 8.9–10.3)
Chloride: 99 mmol/L (ref 98–111)
GFR calc Af Amer: >60 mL/min (ref >60)
GFR calc non Af Amer: >60 mL/min (ref >60)
Glucose, Bld: 241 mg/dL — ABNORMAL HIGH (ref 70–99)
Potassium: 4 mmol/L (ref 3.5–5.1)
Sodium: 135 mmol/L (ref 135–145)

## 2018-08-30 LAB — HEMOGLOBIN A1C
Hgb A1c MFr Bld: 8.7 % — ABNORMAL HIGH (ref 4.8–5.6)
Mean Plasma Glucose: 202.99 mg/dL

## 2018-08-30 LAB — APTT: aPTT: 28 seconds (ref 24–36)

## 2018-08-30 LAB — MAGNESIUM: Magnesium: 1.8 mg/dL (ref 1.7–2.4)

## 2018-08-30 LAB — PROTIME-INR
INR: 1.14
Prothrombin Time: 14.5 seconds (ref 11.4–15.2)

## 2018-08-30 LAB — HEPARIN LEVEL (UNFRACTIONATED): Heparin Unfractionated: 0.51 IU/mL (ref 0.30–0.70)

## 2018-08-30 MED ORDER — ROSUVASTATIN CALCIUM 5 MG PO TABS
5.0000 mg | ORAL_TABLET | Freq: Every day | ORAL | Status: DC
  Administered 2018-08-30 – 2018-09-01 (×3): 5 mg via ORAL
  Filled 2018-08-30 (×4): qty 1

## 2018-08-30 MED ORDER — INSULIN ASPART 100 UNIT/ML ~~LOC~~ SOLN
0.0000 [IU] | Freq: Three times a day (TID) | SUBCUTANEOUS | Status: DC
  Administered 2018-08-30: 8 [IU] via SUBCUTANEOUS
  Administered 2018-08-30: 5 [IU] via SUBCUTANEOUS
  Administered 2018-08-31: 3 [IU] via SUBCUTANEOUS
  Administered 2018-08-31: 8 [IU] via SUBCUTANEOUS
  Administered 2018-09-01: 11 [IU] via SUBCUTANEOUS
  Administered 2018-09-02: 5 [IU] via SUBCUTANEOUS
  Administered 2018-09-02: 2 [IU] via SUBCUTANEOUS
  Filled 2018-08-30: qty 1

## 2018-08-30 MED ORDER — KETOTIFEN FUMARATE 0.025 % OP SOLN
1.0000 [drp] | Freq: Two times a day (BID) | OPHTHALMIC | Status: DC | PRN
  Filled 2018-08-30: qty 5

## 2018-08-30 MED ORDER — ASPIRIN EC 81 MG PO TBEC
81.0000 mg | DELAYED_RELEASE_TABLET | Freq: Every day | ORAL | Status: DC
  Administered 2018-08-30 – 2018-09-02 (×4): 81 mg via ORAL
  Filled 2018-08-30 (×4): qty 1

## 2018-08-30 MED ORDER — METOPROLOL TARTRATE 12.5 MG HALF TABLET
12.5000 mg | ORAL_TABLET | Freq: Two times a day (BID) | ORAL | Status: DC
  Administered 2018-08-30 – 2018-09-01 (×6): 12.5 mg via ORAL
  Filled 2018-08-30 (×6): qty 1

## 2018-08-30 MED ORDER — ACETAMINOPHEN 325 MG PO TABS: 650.0000 mg | ORAL_TABLET | Freq: Four times a day (QID) | ORAL | Status: DC | PRN

## 2018-08-30 MED ORDER — ONDANSETRON HCL 4 MG/2ML IJ SOLN: 4.0000 mg | Freq: Four times a day (QID) | INTRAMUSCULAR | Status: DC | PRN

## 2018-08-30 MED ORDER — ASPIRIN 81 MG PO CHEW
324.0000 mg | CHEWABLE_TABLET | Freq: Once | ORAL | Status: AC
  Administered 2018-08-30: 324 mg via ORAL
  Filled 2018-08-30: qty 4

## 2018-08-30 MED ORDER — LORATADINE 10 MG PO TABS: 10.0000 mg | ORAL_TABLET | Freq: Every day | ORAL | Status: DC | PRN

## 2018-08-30 MED ORDER — INSULIN GLARGINE 100 UNIT/ML ~~LOC~~ SOLN
20.0000 [IU] | Freq: Every day | SUBCUTANEOUS | Status: DC
  Filled 2018-08-30 (×2): qty 0.2

## 2018-08-30 MED ORDER — HEPARIN (PORCINE) 25000 UT/250ML-% IV SOLN
1200.0000 [IU]/h | INTRAVENOUS | Status: DC
  Administered 2018-08-30 – 2018-08-31 (×3): 1200 [IU]/h via INTRAVENOUS
  Filled 2018-08-30 (×3): qty 250

## 2018-08-30 MED ORDER — VITAMIN D 25 MCG (1000 UNIT) PO TABS
4000.0000 [IU] | ORAL_TABLET | Freq: Every day | ORAL | Status: DC
  Administered 2018-08-30 – 2018-09-02 (×3): 4000 [IU] via ORAL
  Filled 2018-08-30 (×4): qty 4

## 2018-08-30 MED ORDER — ONDANSETRON HCL 4 MG PO TABS: 4.0000 mg | ORAL_TABLET | Freq: Four times a day (QID) | ORAL | Status: DC | PRN

## 2018-08-30 MED ORDER — PANTOPRAZOLE SODIUM 40 MG PO TBEC
40.0000 mg | DELAYED_RELEASE_TABLET | Freq: Every day | ORAL | Status: DC
  Administered 2018-08-30 – 2018-09-02 (×3): 40 mg via ORAL
  Filled 2018-08-30 (×3): qty 1

## 2018-08-30 MED ORDER — AMLODIPINE BESYLATE 5 MG PO TABS
5.0000 mg | ORAL_TABLET | Freq: Every day | ORAL | Status: DC
  Administered 2018-08-30 – 2018-09-01 (×3): 5 mg via ORAL
  Filled 2018-08-30 (×3): qty 1

## 2018-08-30 MED ORDER — ACETAMINOPHEN 650 MG RE SUPP: 650.0000 mg | Freq: Four times a day (QID) | RECTAL | Status: DC | PRN

## 2018-08-30 MED ORDER — VITAMIN B-12 1000 MCG PO TABS
2000.0000 ug | ORAL_TABLET | Freq: Every day | ORAL | Status: DC
  Administered 2018-08-30 – 2018-09-02 (×3): 2000 ug via ORAL
  Filled 2018-08-30 (×4): qty 2

## 2018-08-30 MED ORDER — INSULIN ASPART 100 UNIT/ML ~~LOC~~ SOLN
0.0000 [IU] | Freq: Every day | SUBCUTANEOUS | Status: DC
  Administered 2018-08-30: 2 [IU] via SUBCUTANEOUS
  Administered 2018-08-31: 3 [IU] via SUBCUTANEOUS
  Administered 2018-09-01: 2 [IU] via SUBCUTANEOUS

## 2018-08-30 MED ORDER — ISOSORBIDE MONONITRATE ER 60 MG PO TB24
60.0000 mg | ORAL_TABLET | Freq: Every day | ORAL | Status: DC
  Administered 2018-08-30 – 2018-09-02 (×4): 60 mg via ORAL
  Filled 2018-08-30 (×5): qty 1

## 2018-08-30 MED ORDER — INSULIN GLARGINE 100 UNIT/ML ~~LOC~~ SOLN
20.0000 [IU] | Freq: Every day | SUBCUTANEOUS | Status: DC
  Administered 2018-08-30 – 2018-09-01 (×3): 20 [IU] via SUBCUTANEOUS
  Filled 2018-08-30 (×4): qty 0.2

## 2018-08-30 MED ORDER — HEPARIN BOLUS VIA INFUSION
4000.0000 [IU] | Freq: Once | INTRAVENOUS | Status: AC
  Administered 2018-08-30: 4000 [IU] via INTRAVENOUS

## 2018-08-30 NOTE — Progress Notes (Addendum)
ANTICOAGULATION CONSULT NOTE - Follow-Up  Pharmacy Consult for heparin Indication: chest pain/ACS  Allergies  Allergen Reactions  . Lipitor [Atorvastatin] Diarrhea  . Pravastatin Diarrhea    Patient Measurements: Height: 5\' 10"  (177.8 cm) Weight: 199 lb 15.3 oz (90.7 kg) IBW/kg (Calculated) : 73 HEPARIN DW (KG): 90.7   Vital Signs: BP: 128/94 (02/02 1100) Pulse Rate: 66 (02/02 1100)  Labs: Recent Labs    08/30/18 0200 08/30/18 0222 08/30/18 0529 08/30/18 0803 08/30/18 0928  HGB 13.6  --   --   --   --   HCT 41.4  --   --   --   --   PLT 241  --   --   --   --   APTT  --  28  --   --   --   LABPROT  --  14.5  --   --   --   INR  --  1.14  --   --   --   HEPARINUNFRC  --   --   --   --  0.51  CREATININE 0.78  --   --   --   --   TROPONINI 0.03*  --  0.10* 0.14*  --    Estimated Creatinine Clearance: 94.6 mL/min (by C-G formula based on SCr of 0.78 mg/dL).  Medical History: Past Medical History:  Diagnosis Date  . Barrett's esophagus 2008  . CAD (coronary artery disease) 01/09/15   a. cath 01/09/15 PCI with 2 DES stents in LAD/1 in Dx with comcomitant CAD in distal LAD, [place].   . Diabetes mellitus without complication (HCC)   . Hypercholesterolemia   . Hypertension   . STEMI (ST elevation myocardial infarction) (HCC) 01/09/15    Medications:  Infusions:  . heparin 1,200 Units/hr (08/30/18 0234)    Assessment: Patient arrived to ED with chest pain.  Was admitted 2 days ago for the same. Starting heparin gtt for chest pain/ NSTEMI.  Heparin level is within goal range this morning, at 0.51 IU/mL.   Goal of Therapy:  Heparin level 0.3-0.7 units/ml   Plan:  Continue  heparin infusion at 1200 units/hr Check anti-Xa level  daily while on heparin Continue to monitor H&H and platelets    Tama High, Pharm. D. Clinical Pharmacist 08/30/2018 12:16 PM

## 2018-08-30 NOTE — Progress Notes (Signed)
CRITICAL VALUE ALERT  Critical Value:  Troponin 0.11  Date & Time Notied: 08/30/18 2147  Provider Notified: Mid Level  Orders Received/Actions taken: No new orders at this time

## 2018-08-30 NOTE — ED Triage Notes (Signed)
Pt c/o left sided chest pain since 2100 after eating ground beef and tomato casserole for dinner around 1930, pt reports he was seen for same 2 days prior this week

## 2018-08-30 NOTE — Progress Notes (Signed)
ANTICOAGULATION CONSULT NOTE - Preliminary  Pharmacy Consult for heparin Indication: chest pain/ACS  Allergies  Allergen Reactions  . Lipitor [Atorvastatin] Diarrhea  . Pravastatin Diarrhea    Patient Measurements: Height: 5\' 10"  (177.8 cm) Weight: 199 lb 15.3 oz (90.7 kg) IBW/kg (Calculated) : 73 HEPARIN DW (KG): 90.7   Vital Signs: BP: 142/101 (02/02 0200) Pulse Rate: 60 (02/02 0200)  Labs: Recent Labs    08/30/18 0200  HGB 13.6  HCT 41.4  PLT 241   Estimated Creatinine Clearance: 94.6 mL/min (by C-G formula based on SCr of 0.74 mg/dL).  Medical History: Past Medical History:  Diagnosis Date  . Barrett's esophagus 2008  . CAD (coronary artery disease) 01/09/15   a. cath 01/09/15 PCI with 2 DES stents in LAD/1 in Dx with comcomitant CAD in distal LAD, [place].   . Diabetes mellitus without complication (HCC)   . Hypercholesterolemia   . Hypertension   . STEMI (ST elevation myocardial infarction) (HCC) 01/09/15    Medications:  Infusions:  . heparin     PRN:   Assessment: Patient arrived to ED with chest pain.  Was admitted 2 days ago for the same. Starting heparin gtt for chest pain, NSTEMI.  Labs ordered.   Goal of Therapy:  Heparin level 0.3-0.7 units/ml   Plan:  Give 4000 units bolus x 1 Start heparin infusion at 1200 units/hr Check anti-Xa level in 6 hours and daily while on heparin Continue to monitor H&H and platelets Preliminary review of pertinent patient information completed.  Jeani Hawking clinical pharmacist will complete review during morning rounds to assess the patient and finalize treatment regimen.  Javayah Magaw, Berneice Heinrich, RPH 08/30/2018,2:19 AM

## 2018-08-30 NOTE — ED Triage Notes (Signed)
Pt states he feels like he ate too much, has taken daily home meds, also reports taking 2 nitro tabs at 2100

## 2018-08-30 NOTE — H&P (Signed)
History and Physical    [name]:[id number] DOB: 10-Apr-1946 DOA: 08/30/2018  PCP: [facility]   Patient coming from: Home.  I have personally briefly reviewed patient's old medical records in [facility]  Chief Complaint: Chest pain.  HPI: Gary Wheeler is a 73 y.o. male with medical history significant of Barrett's esophagus, CAD, history of STEMI, history of stent placement x3, type 2 diabetes, hyperlipidemia, hypertension who was recently admitted and discharged for chest pain earlier this week and is returning to the emergency department due to chest pain.  Per patient, he has been having chest tightness since discharge.  Yesterday evening, he ate a lot more than usual for dinner.  An hour after that he developed chest pain he developed increased pressure associated with dyspepsia feeling, mild dyspnea and mild nausea.  This did not resolve with his sublingual nitroglycerin, but stated that his nitroglycerin is over a year old.  He belched several times and after this has significant relief of his tightness.  He denies palpitations, dizziness, diaphoresis, PND, orthopnea or recent pitting edema of the lower extremities.  No fever, chills, sore throat, rhinorrhea, wheezing, hemoptysis, emesis, diarrhea, constipation, melena or hematochezia.  No dysuria, frequency or hematuria.  Denies polyuria, polydipsia, polyphagia or blurred vision.  ED Course: Initial vital signs pulse 70, respirations 17, blood pressure 147/98 mmHg and O2 sat 95% on room air.  The patient was given aspirin 324 mg and was started on a heparin infusion.  His EKG on arrival to the ED was low voltage shows sinus rhythm with borderline T wave normalities on anterior leads.it is not not significantly changed from previous. Initial troponin was 0.03 ng/mL.  His CBC was normal.  PT/INR and PTT were within normal limits.  BMP shows a glucose of 241 mg/dL, but all other values are normal.  Review of  Systems: As per HPI otherwise 10 point review of systems negative.   Past Medical History:  Diagnosis Date  . Barrett's esophagus 2008  . CAD (coronary artery disease) 01/09/15   a. cath 01/09/15 PCI with 2 DES stents in LAD/1 in Dx with comcomitant CAD in distal LAD, RI.   . Diabetes mellitus without complication (HCC)   . Hypercholesterolemia   . Hypertension   . STEMI (ST elevation myocardial infarction) (HCC) 01/09/15    Past Surgical History:  Procedure Laterality Date  . BIOPSY  03/18/2018   Procedure: BIOPSY;  Surgeon: Corbin Ade, MD;  Location: [facility];  Service: Endoscopy;;  esophageal biopsies  . CARDIAC CATHETERIZATION N/A 01/09/2015   Procedure: Left Heart Cath and Coronary Angiography;  Surgeon: Marykay Lex, MD;  Location: [facility] INVASIVE CV LAB;  Service: Cardiovascular;  Laterality: N/A;  . CARDIAC CATHETERIZATION  01/09/2015   with 3 stents  . COLONOSCOPY  2008   Dr. Linna Darner: normal  . COLONOSCOPY N/A 03/18/2018   diverticulosis in sigmoid and descending  . ESOPHAGOGASTRODUODENOSCOPY  2008   Dr. Linna Darner: Barrett's. Path with a minute fragment of squamous cell epithelium suggestive of dysplasia  . ESOPHAGOGASTRODUODENOSCOPY N/A 03/18/2018   DR. Rourk: Barrett's esophagus, small hiatal hernia, minimal polypoid gastric mucosa,normal duodenum, surveillance in 3 years  . left ORIF ankle       reports that he has been smoking cigars. He has never used smokeless tobacco. He reports current alcohol use. He reports that he does not use drugs.  Allergies  Allergen Reactions  . Lipitor [Atorvastatin] Diarrhea  . Pravastatin Diarrhea  Family History  Problem Relation Age of Onset  . Coronary artery disease Father   . Coronary artery disease Paternal Grandfather   . Prostate cancer Brother   . Colon cancer Neg Hx   . Colon polyps Neg Hx    Prior to Admission medications   Medication Sig Start Date End Date Taking? Authorizing Provider  amLODipine (NORVASC) 10 MG  tablet Take 5 mg by mouth daily.     [provider]  aspirin EC 81 MG tablet Take 81 mg by mouth daily.    [provider]  cholecalciferol (VITAMIN D) 1000 UNITS tablet Take 4,000 Units by mouth daily.     [provider]  insulin aspart (NOVOLOG FLEXPEN) 100 UNIT/ML FlexPen Inject 10-20 Units into the skin 3 (three) times daily with meals. Per sliding scale    [provider]  insulin glargine (LANTUS) 100 UNIT/ML injection Inject 24 Units into the skin daily.    [provider]  isosorbide mononitrate (IMDUR) 30 MG 24 hr tablet Take 1 tablet (30 mg total) by mouth daily. 01/11/15   Bhagat, Sharrell Ku, PA  ketotifen (ZADITOR) 0.025 % ophthalmic solution Place 1 drop into both eyes 2 (two) times daily as needed (for dry eyes).    [provider]  lisinopril-hydrochlorothiazide (PRINZIDE,ZESTORETIC) 20-12.5 MG per tablet Take 1 tablet by mouth 2 (two) times daily.     [provider]  loratadine (CLARITIN) 10 MG tablet Take 10 mg by mouth daily as needed for allergies.    [provider]  metFORMIN (GLUCOPHAGE) 500 MG tablet Take 500 mg by mouth 2 (two) times daily with a meal.    [provider]  metoprolol tartrate (LOPRESSOR) 25 MG tablet Take 12.5 mg by mouth 2 (two) times daily.    [provider]  nitroGLYCERIN (NITROSTAT) 0.4 MG SL tablet Place 1 tablet (0.4 mg total) under the tongue every 5 (five) minutes x 3 doses as needed for chest pain. 01/11/15   Bhagat, Sharrell Ku, PA  omeprazole (PRILOSEC) 20 MG capsule Take 20 mg by mouth daily.    [provider]  potassium chloride (K-DUR,KLOR-CON) 10 MEQ tablet Take 10 mEq by mouth daily as needed (for cramping).    [provider]  rosuvastatin (CRESTOR) 5 MG tablet Take 5 mg by mouth at bedtime.     [provider]  vitamin B-12 (CYANOCOBALAMIN) 1000 MCG tablet Take 2,000 mcg by mouth daily.     [provider]     Physical Exam: Vitals:   08/30/18 0100 08/30/18 0130 08/30/18 0200 08/30/18 0230  BP: (!) 129/92 (!) 137/96 (!) 142/101 (!) 131/91  Pulse: 68 65 60 62  Resp: 19 19 (!) 21 19  SpO2: 96% 98% 100% 96%  Weight:      Height:        Constitutional: NAD, calm, comfortable Eyes: PERRL, lids and conjunctivae normal ENMT: Mucous membranes are moist. Posterior pharynx clear of any exudate or lesions. Neck: normal, supple, no masses, no thyromegaly Respiratory: clear to auscultation bilaterally, no wheezing, no crackles. Normal respiratory effort. No accessory muscle use.  Cardiovascular: Regular rate and rhythm, no murmurs / rubs / gallops. No extremity edema. 2+ pedal pulses. No carotid bruits.  Abdomen: Obese, soft, no tenderness, no masses palpated. No hepatosplenomegaly. Bowel sounds positive.  Musculoskeletal: no clubbing / cyanosis. No joint deformity upper and lower extremities. Good ROM, no contractures. Normal muscle tone.  Skin: no rashes, lesions, ulcers. No induration Neurologic: CN 2-12 grossly intact.  Sensation intact, DTR normal. Strength 5/5 in all 4.  Psychiatric: Normal judgment and insight. Alert and oriented x 3. Normal mood.   Labs on Admission: I have personally reviewed following labs and imaging studies  CBC: Recent Labs  Lab 08/24/18 0946 08/30/18 0200  WBC 7.1 9.6  NEUTROABS 4.6  --   HGB 14.5 13.6  HCT 42.9 41.4  MCV 85.0 85.2  PLT 268 241   Basic Metabolic Panel: Recent Labs  Lab 08/24/18 0946 08/30/18 0200  NA 137 135  K 3.8 4.0  CL 103 99  CO2 25 27  GLUCOSE 238* 241*  BUN 17 16  CREATININE 0.74 0.78  CALCIUM 9.1 9.4   GFR: Estimated Creatinine Clearance: 94.6 mL/min (by C-G formula based on SCr of 0.78 mg/dL). Liver Function Tests: No results for input(s): AST, ALT, ALKPHOS, BILITOT, PROT, ALBUMIN in the last 168 hours. No results for input(s): LIPASE, AMYLASE in the last 168 hours. No results for input(s): AMMONIA in the last 168  hours. Coagulation Profile: Recent Labs  Lab 08/30/18 0222  INR 1.14   Cardiac Enzymes: Recent Labs  Lab 08/24/18 0946 08/24/18 1536 08/24/18 2120 08/25/18 0316 08/30/18 0200  TROPONINI <0.03 <0.03 <0.03 <0.03 0.03*   BNP (last 3 results) No results for input(s): PROBNP in the last 8760 hours. HbA1C: No results for input(s): HGBA1C in the last 72 hours. CBG: Recent Labs  Lab 08/24/18 1545 08/24/18 1810 08/24/18 2127 08/25/18 0717 08/25/18 1119  GLUCAP 249* 202* 203* 168* 161*   Lipid Profile: No results for input(s): CHOL, HDL, LDLCALC, TRIG, CHOLHDL, LDLDIRECT in the last 72 hours. Thyroid Function Tests: No results for input(s): TSH, T4TOTAL, FREET4, T3FREE, THYROIDAB in the last 72 hours. Anemia Panel: No results for input(s): VITAMINB12, FOLATE, FERRITIN, TIBC, IRON, RETICCTPCT in the last 72 hours. Urine analysis: No results found for: COLORURINE, APPEARANCEUR, LABSPEC, PHURINE, GLUCOSEU, HGBUR, BILIRUBINUR, KETONESUR, PROTEINUR, UROBILINOGEN, NITRITE, LEUKOCYTESUR  Radiological Exams on Admission: No results found.    01/09/2015 left heart cath and coronary angiography Coronary Stent Intervention  Left Heart Cath and Coronary Angiography  Conclusion   1. Mid LAD to Dist LAD lesion, 95% stenosed. 2 overlapping drug-eluting stents (Xience Alpine 2.5 mm x 18 mm, 2.5 mm x 8 mm - postdilated to 3.25 mm proximally and 2.7 mm distally) were placed. There is a 0% residual stenosis post intervention. 2. Ost 2nd Diag to 2nd Diag lesion, 90% stenosed. A Xience Alpine 2.25 mm x 12 mm drug-eluting stent was placed - mini crush technique with the LAD stent. There is a 0% residual stenosis post intervention. 3. Dist LAD lesion, 99% stenosed due to distal thrombus embolization. Also mild residual wire related dissection in the distal D2 with distal embolus of thrombus 4. Ramus lesion, 70% stenosed. Consider medical management for now. 5. Proximal 3rd RPLB-1 lesion, 65%  stenosed. Inferior branch of distal 3rd RPLB-2 lesion, 90% stenosed. Likely too far distal for PCI. Consider medical management for now. 6. Mid LAD lesion, 40% stenosed. 7. There is mild left ventricular systolic dysfunction. With distal anterior and inferoapical hypokinesis. This would likely explain the inferior ST elevations.   Multivessel disease with downstream disease in the distal LAD and RPL system as well as moderate caliber ramus intermedius lesion. Successful complex bifurcation stenting of the mid LAD/D2 with mini crush technique and kissing balloon stent post-dilation.  Recommendations:  Standard radial band removal post PCI  Will continue Aggrastat for 12 hours post PCI for the distal thrombus embolization and  D2 distal dissection  Will run IV nitroglycerin for blood pressure and post PCI residual angina  Restart home blood pressure medications  Sliding scale insulin with glipizide. Hold metformin 48 hours.  He will likely need aggressive glycemic control..   Based on the extent of disease and residual thrombus, I have not recommended fast-track discharge.   Marykay Lex, M.D., M.S. Interventional Cardiologist    Echocardiogram 08/25/2018 ------------------------------------------------------------------- LV EF: 55% -   60%  ------------------------------------------------------------------- Indications:      Chest pain 786.51.  ------------------------------------------------------------------- History:   PMH:  Acquired from the patient and from the patient&'s chart.  Coronary artery disease.  PMH:  Acute ST elevation myocardial infarction (STEMI) involving left anterior descending coronary artery Ischemic cardiomyopathy.  Risk factors: Hypertension. Diabetes mellitus. Dyslipidemia.  ------------------------------------------------------------------- Study Conclusions  - Left ventricle: Mild basal septal hypertrophy. Systolic function   was  normal. The estimated ejection fraction was in the range of   55% to 60%. Doppler parameters are consistent with abnormal left   ventricular relaxation (grade 1 diastolic dysfunction). - Regional wall motion abnormality: Mild hypokinesis of the basal   inferior myocardium.  Impressions:  - LVEF has normalized since prior study in 2016.  EKG: Independently reviewed.  Vent. rate 73 BPM PR interval * ms QRS duration 102 ms QT/QTc 403/445 ms P-R-T axes 50 37 37 Sinus rhythm Low voltage, precordial leads Borderline T abnormalities, anterior leads Not significantly changed from previous.  Assessment/Plan Principal Problem:   Chest pain Observation/telemetry. Continue supplemental oxygen. Trend troponin level. Serial EKG. Continue aspirin, beta-blocker and heparin. Check status of cardiology clinic stress test scheduling in a.m.  Active Problems:   Essential hypertension Hold Prinzide. Metoprolol 12.5 mg p.o. twice daily. Follow-up BP, HR, renal function electrolytes.    CAD (coronary artery disease), native coronary artery Continue aspirin, beta-blocker, Imdur and statin.    HLD (hyperlipidemia) Continue Crestor 5 mg p.o. at bedtime.    GERD (gastroesophageal reflux disease) Protonix 40 mg p.o. daily.    Type 2 diabetes mellitus (HCC) Carbohydrate modified diet. Continue Lantus 24 units SQ daily. Hold metformin. CBG monitoring regular insulin sliding scale.    DVT prophylaxis: On full dose heparin. Code Status: Full code. Family Communication: Disposition Plan: Observation for medical treatment: Troponin level trending and serial EKGs. Consults called:  Admission status: Observation/telemetry.   Bobette Mo MD [facility]  08/30/2018, 2:59 AM

## 2018-08-30 NOTE — ED Provider Notes (Signed)
[facility] Provider Note   CSN: [id number] Arrival date & time: 08/30/18  0005     History   Chief Complaint Chief Complaint  Patient presents with  . Chest Pain    HPI Gary Wheeler is a 73 y.o. male.  HPI  73 year old male comes in with chief complaint of chest pain.  Patient has known history of coronary artery disease status post 3 stents that were placed in 2016.  He is also known to have 2 critical stenosis or his coronary artery disease that are being medically managed.  Patient also has history of Barrett's esophagus.  He reports that after supper he started having severe pain over the left side of his chest radiating to his left arm.  The pain is similar to his ACS type pain.  Patient states that he had come to the ER recently and was admitted for angina, however he was discharged after a normal echocardiogram.  Review of system is positive for mild associated shortness of breath.  At the moment patient is still having discomfort, but it is significantly less compared to the pain he had at home.  Wife also reports that patient has had severe burping since he started having chest pain.  Patient reports that his pain is not similar to his heartburn/esophagitis type pain.  Past Medical History:  Diagnosis Date  . Barrett's esophagus 2008  . CAD (coronary artery disease) 01/09/15   a. cath 01/09/15 PCI with 2 DES stents in LAD/1 in Dx with comcomitant CAD in distal LAD, [place].   . Diabetes mellitus without complication (HCC)   . Hypercholesterolemia   . Hypertension   . STEMI (ST elevation myocardial infarction) (HCC) 01/09/15    Patient Active Problem List   Diagnosis Date Noted  . Type 2 diabetes mellitus (HCC) 08/30/2018  . Uncontrolled type 2 diabetes mellitus with hyperglycemia (HCC) 08/30/2018  . Chest pain 08/24/2018  . CAD (coronary artery disease), native coronary artery 08/24/2018  . Ischemic cardiomyopathy 08/24/2018  . HLD (hyperlipidemia)  08/24/2018  . GERD (gastroesophageal reflux disease) 08/24/2018  . Encounter for screening colonoscopy 02/11/2018  . Barrett's esophagus 02/11/2018  . Acute ST elevation myocardial infarction (STEMI) involving left anterior descending coronary artery (HCC) 01/09/2015  . ST elevation myocardial infarction (STEMI) involving other coronary artery of inferior wall (HCC) 01/09/2015  . STEMI (ST elevation myocardial infarction) (HCC) 01/09/2015  . Hypertension   . IDDM (insulin dependent diabetes mellitus) (HCC)   . Essential hypertension   . Coronary artery disease involving native coronary artery of native heart with unstable angina pectoris [facility](HCC)     Past Surgical History:  Procedure Laterality Date  . BIOPSY  03/18/2018   Procedure: BIOPSY;  Surgeon: Corbin Adeourk, Robert M, MD;  Location: [facility];  Service: Endoscopy;;  esophageal biopsies  . CARDIAC CATHETERIZATION N/A 01/09/2015   Procedure: Left Heart Cath and Coronary Angiography;  Surgeon: Marykay Lexavid W Harding, MD;  [facility];  Service: Cardiovascular;  Laterality: N/A;  . CARDIAC CATHETERIZATION  01/09/2015   with 3 stents  . COLONOSCOPY  2008   Dr. Linna DarnerAnwar: normal  . COLONOSCOPY N/A 03/18/2018   diverticulosis in sigmoid and descending  . ESOPHAGOGASTRODUODENOSCOPY  2008   Dr. Linna DarnerAnwar: Barrett's. Path with a minute fragment of squamous cell epithelium suggestive of dysplasia  . ESOPHAGOGASTRODUODENOSCOPY N/A 03/18/2018   DR. Rourk: Barrett's esophagus, small hiatal hernia, minimal polypoid gastric mucosa,normal duodenum, surveillance in 3 years  . left ORIF ankle  Home Medications    Prior to Admission medications   Medication Sig Start Date End Date Taking? Authorizing Provider  amLODipine (NORVASC) 10 MG tablet Take 5 mg by mouth daily.    Yes [provider]  aspirin EC 81 MG tablet Take 81 mg by mouth daily.   Yes [provider]  cholecalciferol (VITAMIN D) 1000 UNITS tablet Take 4,000  Units by mouth daily.    Yes [provider]  insulin aspart (NOVOLOG FLEXPEN) 100 UNIT/ML FlexPen Inject 10-20 Units into the skin 3 (three) times daily with meals. Per sliding scale   Yes [provider]  insulin glargine (LANTUS) 100 UNIT/ML injection Inject 24 Units into the skin daily.   Yes [provider]  isosorbide mononitrate (IMDUR) 30 MG 24 hr tablet Take 1 tablet (30 mg total) by mouth daily. 01/11/15  Yes Bhagat, Bhavinkumar, PA  ketotifen (ZADITOR) 0.025 % ophthalmic solution Place 1 drop into both eyes 2 (two) times daily as needed (for dry eyes).   Yes [provider]  lisinopril-hydrochlorothiazide (PRINZIDE,ZESTORETIC) 20-12.5 MG per tablet Take 1 tablet by mouth 2 (two) times daily.    Yes [provider]  loratadine (CLARITIN) 10 MG tablet Take 10 mg by mouth daily as needed for allergies.   Yes [provider]  metFORMIN (GLUCOPHAGE) 500 MG tablet Take 500 mg by mouth 2 (two) times daily with a meal.   Yes [provider]  metoprolol tartrate (LOPRESSOR) 25 MG tablet Take 12.5 mg by mouth 2 (two) times daily.   Yes [provider]  omeprazole (PRILOSEC) 20 MG capsule Take 20 mg by mouth daily.   Yes [provider]  potassium chloride (K-DUR,KLOR-CON) 10 MEQ tablet Take 10 mEq by mouth daily as needed (for cramping).   Yes [provider]  rosuvastatin (CRESTOR) 5 MG tablet Take 5 mg by mouth at bedtime.    Yes [provider]  vitamin B-12 (CYANOCOBALAMIN) 1000 MCG tablet Take 2,000 mcg by mouth daily.    Yes [provider]  nitroGLYCERIN (NITROSTAT) 0.4 MG SL tablet Place 1 tablet (0.4 mg total) under the tongue every 5 (five) minutes x 3 doses as needed for chest pain. 01/11/15   Manson Passey, PA    Family History Family History  Problem Relation Age of Onset  . Coronary artery disease Father   . Coronary artery disease Paternal Grandfather   . Prostate  cancer Brother   . Colon cancer Neg Hx   . Colon polyps Neg Hx     Social History Social History   Tobacco Use  . Smoking status: Current Some Day Smoker    Types: Cigars    Last attempt to quit: 07/30/1999    Years since quitting: 19.0  . Smokeless tobacco: Never Used  Substance Use Topics  . Alcohol use: Yes    Alcohol/week: 0.0 standard drinks    Comment: rare, occasional mixed drink   . Drug use: No     Allergies   Lipitor [atorvastatin] and Pravastatin   Review of Systems Review of Systems  Constitutional: Positive for activity change. Negative for diaphoresis.  Respiratory: Negative for shortness of breath.   Cardiovascular: Positive for chest pain.  Gastrointestinal: Negative for nausea and vomiting.  All other systems reviewed and are negative.    Physical Exam Updated Vital Signs BP 127/85 (BP Location: Left Arm)   Pulse 72   Temp 98.3 F (36.8 C) (Oral)   Resp 20   Ht 5'  10" (1.778 m)   Wt 88.6 kg   SpO2 97%   BMI 28.02 kg/m   Physical Exam Vitals signs and nursing note reviewed.  Constitutional:      Appearance: He is well-developed.  HENT:     Head: Atraumatic.  Neck:     Musculoskeletal: Neck supple.  Cardiovascular:     Rate and Rhythm: Normal rate.     Pulses:          Radial pulses are 2+ on the right side and 2+ on the left side.  Pulmonary:     Effort: Pulmonary effort is normal.  Musculoskeletal:     Right lower leg: No edema.     Left lower leg: No edema.  Skin:    General: Skin is warm.  Neurological:     Mental Status: He is alert and oriented to person, place, and time.      ED Treatments / Results  Labs (all labs ordered are listed, but only abnormal results are displayed) Labs Reviewed  BASIC METABOLIC PANEL - Abnormal; Notable for the following components:      Result Value   Glucose, Bld 241 (*)    All other components within normal limits  TROPONIN I - Abnormal; Notable for the following components:    Troponin I 0.03 (*)    All other components within normal limits  TROPONIN I - Abnormal; Notable for the following components:   Troponin I 0.10 (*)    All other components within normal limits  TROPONIN I - Abnormal; Notable for the following components:   Troponin I 0.14 (*)    All other components within normal limits  TROPONIN I - Abnormal; Notable for the following components:   Troponin I 0.14 (*)    All other components within normal limits  TROPONIN I - Abnormal; Notable for the following components:   Troponin I 0.11 (*)    All other components within normal limits  HEMOGLOBIN A1C - Abnormal; Notable for the following components:   Hgb A1c MFr Bld 8.7 (*)    All other components within normal limits  GLUCOSE, CAPILLARY - Abnormal; Notable for the following components:   Glucose-Capillary 244 (*)    All other components within normal limits  GLUCOSE, CAPILLARY - Abnormal; Notable for the following components:   Glucose-Capillary 237 (*)    All other components within normal limits  CBG MONITORING, ED - Abnormal; Notable for the following components:   Glucose-Capillary 302 (*)    All other components within normal limits  CBG MONITORING, ED - Abnormal; Notable for the following components:   Glucose-Capillary 274 (*)    All other components within normal limits  CBG MONITORING, ED - Abnormal; Notable for the following components:   Glucose-Capillary 260 (*)    All other components within normal limits  CBC  APTT  PROTIME-INR  MAGNESIUM  HEPARIN LEVEL (UNFRACTIONATED)  BASIC METABOLIC PANEL  CBC  HEPARIN LEVEL (UNFRACTIONATED)    EKG EKG Interpretation  Date/Time:  Sunday August 30 2018 00:24:57 EST Ventricular Rate:  73 PR Interval:    QRS Duration: 102 QT Interval:  403 QTC Calculation: 445 R Axis:   37 Text Interpretation:  Sinus rhythm Low voltage, precordial leads Borderline T abnormalities, anterior leads No acute changes Confirmed by Derwood KaplanNanavati, Scottlyn Mchaney  [id number]) on 08/30/2018 2:42:29 AM   Radiology No results found.  Procedures .Critical Care Performed by: Derwood KaplanNanavati, Margaretann Abate, MD Authorized by: Derwood KaplanNanavati, Klint Lezcano, MD   Critical care provider statement:  Critical care time (minutes):  33   Critical care was necessary to treat or prevent imminent or life-threatening deterioration of the following conditions:  Cardiac failure   Critical care was time spent personally by me on the following activities:  Discussions with consultants, evaluation of patient's response to treatment, examination of patient, ordering and performing treatments and interventions, ordering and review of laboratory studies, ordering and review of radiographic studies, pulse oximetry, re-evaluation of patient's condition, obtaining history from patient or surrogate and review of old charts   (including critical care time)  Medications Ordered in ED Medications  heparin bolus via infusion 4,000 Units (4,000 Units Intravenous Bolus from Bag 08/30/18 0232)    Followed by  heparin ADULT infusion 100 units/mL (25000 units/227mL sodium chloride 0.45%) (1,200 Units/hr Intravenous New Bag/Given 08/30/18 2012)  ondansetron (ZOFRAN) injection 4 mg (has no administration in time range)  acetaminophen (TYLENOL) tablet 650 mg (has no administration in time range)    Or  acetaminophen (TYLENOL) suppository 650 mg (has no administration in time range)  aspirin EC tablet 81 mg (81 mg Oral Given 08/30/18 0952)  amLODipine (NORVASC) tablet 5 mg (5 mg Oral Given 08/30/18 0952)  isosorbide mononitrate (IMDUR) 24 hr tablet 60 mg (60 mg Oral Given 08/30/18 0952)  metoprolol tartrate (LOPRESSOR) tablet 12.5 mg (12.5 mg Oral Given 08/30/18 2253)  rosuvastatin (CRESTOR) tablet 5 mg (5 mg Oral Given 08/30/18 2254)  pantoprazole (PROTONIX) EC tablet 40 mg (40 mg Oral Given 08/30/18 0952)  vitamin B-12 (CYANOCOBALAMIN) tablet 2,000 mcg (2,000 mcg Oral Given 08/30/18 1050)  cholecalciferol (VITAMIN D3) tablet 4,000  Units (4,000 Units Oral Given 08/30/18 1050)  loratadine (CLARITIN) tablet 10 mg (has no administration in time range)  ketotifen (ZADITOR) 0.025 % ophthalmic solution 1 drop (has no administration in time range)  acetaminophen (TYLENOL) tablet 650 mg (has no administration in time range)    Or  acetaminophen (TYLENOL) suppository 650 mg (has no administration in time range)  ondansetron (ZOFRAN) tablet 4 mg (has no administration in time range)  insulin aspart (novoLOG) injection 0-15 Units (5 Units Subcutaneous Given 08/30/18 1719)  insulin aspart (novoLOG) injection 0-5 Units (2 Units Subcutaneous Given 08/30/18 2255)  insulin glargine (LANTUS) injection 20 Units (20 Units Subcutaneous Given 08/30/18 2255)  aspirin chewable tablet 324 mg (324 mg Oral Given 08/30/18 0155)     Initial Impression / Assessment and Plan / ED Course  I have reviewed the triage vital signs and the nursing notes.  Pertinent labs & imaging results that were available during my care of the patient were reviewed by me and considered in my medical decision making (see chart for details).     73 year old male with known history of coronary artery disease comes in with chief complaint of chest pain.  Chest pain is typical, as it is left-sided and radiating down the left shoulder.  But at time of my assessment, pain is significantly improved.  EKG is not showing any acute findings.  Patient was recently admitted to the hospital for chest pain and rule out ACS.  He states that his current pain is similar to his ACS type pain and not heartburn.  It appears that the cardiology service wanted patient to get delta troponin, which if rising he needed to get a cath, and if not rising then he should have gotten a stress test.  Patient did not get a stress test during last day, and I do not feel comfortable sending him home based on echocardiogram.  Plan is to admit patient for ACS work-up.  We will start him on heparin.  Final Clinical  Impressions(s) / ED Diagnoses   Final diagnoses:  Angina pectoris [facility])    ED Discharge Orders    None       Derwood Kaplan, MD 08/30/18 2330

## 2018-08-30 NOTE — ED Notes (Signed)
Date and time results received: 08/30/18 0237 (use smartphrase ".now" to insert current time)  Test: trop Critical Value: 0.03  Name of Provider Notified: Dr Rhunette Croft Orders Received? Or Actions Taken?:  none

## 2018-08-30 NOTE — ED Notes (Signed)
Patient denies pain and is resting comfortably.  

## 2018-08-30 NOTE — ED Notes (Signed)
CRITICAL VALUE ALERT  Critical Value: troponin 0.10  Date & Time Notied:  08/30/2018 0630  Provider Notified: Dr. Robb Matar  Orders Received/Actions taken: n/a

## 2018-08-30 NOTE — Progress Notes (Signed)
PROGRESS NOTE  Gary Wheeler GSU:[id number] DOB: [birth year] DOA: 08/30/2018 PCP: [facility]  Brief History:  73 year old male with a history of coronary artery disease with history of STEMI June 2016 with DES x 2 to LAD, ischemic cardiomyopathy, diabetes mellitus, hypertension, hyperlipidemia, GERD presenting with left-sided chest discomfort that began approximately 1 hour after eating a large meal on the evening of 08/29/2018.  The patient was recent admitted to the hospital from 08/24/2018 through 08/25/2018 for chest pain.  Patient was seen by cardiology, and no further work-up was recommended at that time.  The patient has been doing well after discharge without any chest discomfort or shortness of breath until the evening of 08/29/2018 after he ate his dinner.  The patient took 2 nitroglycerin at home without any relief.  As result, he presents emergency department for further evaluation.  He stated that he belched on numerous occasions.  Initially it did not relieve his chest discomfort, but he subsequently stated that after "big belch" his chest discomfort was relieved.  He denied any shortness of breath, dizziness, or syncope.  On his way to the emergency department, the patient had some left shoulder discomfort and pain.  Again, he had another bout which relieved the shoulder discomfort.  In the emergency department, the patient was hemodynamically stable saturating 99% on room air.  BMP and CBC were unremarkable.  Troponin was 0.03 increasing to 0.10.  The patient was started on a heparin drip.  He currently is pain-free at the time of my evaluation.  Assessment/Plan: Chest pain -Symptoms appear mostly atypical, related to GI etiology -Continue cycling troponins -Continue IV heparin for now -Increase Imdur to 60 mg daily -If the patient's troponins increase or if chest discomfort recurs, he may need transfer to [facility], heart catheterization -08/25/2018 echo EF 55 to 60%,  grade 1 DD, mild HK of the basal inferior myocardium -Personally reviewed EKG--sinus rhythm, nonspecific T wave changes -Continue aspirin  Diabetes mellitus type 2, uncontrolled with hyperglycemia -Hemoglobin A1c -NovoLog sliding scale -Continue reduced dose Lantus -Holding metformin  Hyperlipidemia -Continue statin -08/24/2018 LDL 60  Essential hypertension -Continue amlodipine and metoprolol tartrate -Continue lisinopril -Holding HCTZ in the event the patient may need to go to heart catheterization   Disposition Plan:   Home 1-2 days pending clinical course Family Communication:  No Family at bedside  Consultants:  cardiology  Code Status:  FULL  DVT Prophylaxis: IV Heparin    Procedures: As Listed in Progress Note Above  Antibiotics: None  Total time spent 35 minutes.  Greater than 50% spent face to face counseling and coordinating care. 0715 to 0740     Subjective: Patient denies fevers, chills, headache, chest pain, dyspnea, nausea, vomiting, diarrhea, abdominal pain, dysuria, hematuria, hematochezia, and melena.   Objective: Vitals:   08/30/18 0600 08/30/18 0630 08/30/18 0645 08/30/18 0700  BP: (!) 131/92 111/82  116/86  Pulse: 61 71 (!) 58 (!) 59  Resp: 20 17 14 14   SpO2: 94% 93% 96%   Weight:      Height:       No intake or output data in the 24 hours ending 08/30/18 0745 Weight change:  Exam:   General:  Pt is alert, follows commands appropriately, not in acute distress  HEENT: No icterus, No thrush, No neck mass, [place]/AT  Cardiovascular: RRR, S1/S2, no rubs, no gallops  Respiratory: CTA bilaterally, no wheezing, no crackles, no rhonchi  Abdomen: Soft/+BS,  non tender, non distended, no guarding  Extremities: No edema, No lymphangitis, No petechiae, No rashes, no synovitis   Data Reviewed: I have personally reviewed following labs and imaging studies Basic Metabolic Panel: Recent Labs  Lab 08/24/18 0946 08/30/18 0200  NA 137 135  K  3.8 4.0  CL 103 99  CO2 25 27  GLUCOSE 238* 241*  BUN 17 16  CREATININE 0.74 0.78  CALCIUM 9.1 9.4  MG  --  1.8   Liver Function Tests: No results for input(s): AST, ALT, ALKPHOS, BILITOT, PROT, ALBUMIN in the last 168 hours. No results for input(s): LIPASE, AMYLASE in the last 168 hours. No results for input(s): AMMONIA in the last 168 hours. Coagulation Profile: Recent Labs  Lab 08/30/18 0222  INR 1.14   CBC: Recent Labs  Lab 08/24/18 0946 08/30/18 0200  WBC 7.1 9.6  NEUTROABS 4.6  --   HGB 14.5 13.6  HCT 42.9 41.4  MCV 85.0 85.2  PLT 268 241   Cardiac Enzymes: Recent Labs  Lab 08/24/18 1536 08/24/18 2120 08/25/18 0316 08/30/18 0200 08/30/18 0529  TROPONINI <0.03 <0.03 <0.03 0.03* 0.10*   BNP: Invalid input(s): POCBNP CBG: Recent Labs  Lab 08/24/18 1545 08/24/18 1810 08/24/18 2127 08/25/18 0717 08/25/18 1119  GLUCAP 249* 202* 203* 168* 161*   HbA1C: No results for input(s): HGBA1C in the last 72 hours. Urine analysis: No results found for: COLORURINE, APPEARANCEUR, LABSPEC, PHURINE, GLUCOSEU, HGBUR, BILIRUBINUR, KETONESUR, PROTEINUR, UROBILINOGEN, NITRITE, LEUKOCYTESUR Sepsis Labs: @LABRCNTIP (procalcitonin:4,lacticidven:4) )No results found for this or any previous visit (from the past 240 hour(s)).   Scheduled Meds: Continuous Infusions: . heparin 1,200 Units/hr (08/30/18 0234)    Procedures/Studies: Dg Chest 2 View  Result Date: 08/24/2018 CLINICAL DATA:  Chest pain and indigestion for 3 days EXAM: CHEST - 2 VIEW COMPARISON:  None. FINDINGS: Normal heart size. Lungs clear. No pneumothorax. No pleural effusion. IMPRESSION: No active cardiopulmonary disease. Electronically Signed   By: Jolaine ClickArthur  Hoss M.D.   On: 08/24/2018 10:06    Catarina Hartshornavid Devarius Nelles, DO  [facility] Pager [phone]  If 7PM-7AM, please contact night-coverage [email or web address] Password [email or web address] 08/30/2018, 7:45 AM   LOS: 0 days

## 2018-08-31 ENCOUNTER — Encounter (HOSPITAL_COMMUNITY): Payer: Self-pay | Admitting: General Practice

## 2018-08-31 DIAGNOSIS — Z8249 Family history of ischemic heart disease and other diseases of the circulatory system: Secondary | ICD-10-CM | POA: Diagnosis not present

## 2018-08-31 DIAGNOSIS — I214 Non-ST elevation (NSTEMI) myocardial infarction: Secondary | ICD-10-CM | POA: Diagnosis not present

## 2018-08-31 DIAGNOSIS — I252 Old myocardial infarction: Secondary | ICD-10-CM | POA: Diagnosis not present

## 2018-08-31 DIAGNOSIS — I10 Essential (primary) hypertension: Secondary | ICD-10-CM | POA: Diagnosis not present

## 2018-08-31 DIAGNOSIS — I25119 Atherosclerotic heart disease of native coronary artery with unspecified angina pectoris: Secondary | ICD-10-CM

## 2018-08-31 DIAGNOSIS — Z888 Allergy status to other drugs, medicaments and biological substances status: Secondary | ICD-10-CM | POA: Diagnosis not present

## 2018-08-31 DIAGNOSIS — R0789 Other chest pain: Secondary | ICD-10-CM | POA: Diagnosis present

## 2018-08-31 DIAGNOSIS — E1165 Type 2 diabetes mellitus with hyperglycemia: Secondary | ICD-10-CM | POA: Diagnosis not present

## 2018-08-31 DIAGNOSIS — E785 Hyperlipidemia, unspecified: Secondary | ICD-10-CM | POA: Diagnosis not present

## 2018-08-31 DIAGNOSIS — I209 Angina pectoris, unspecified: Secondary | ICD-10-CM

## 2018-08-31 DIAGNOSIS — Z79899 Other long term (current) drug therapy: Secondary | ICD-10-CM | POA: Diagnosis not present

## 2018-08-31 DIAGNOSIS — Z7982 Long term (current) use of aspirin: Secondary | ICD-10-CM | POA: Diagnosis not present

## 2018-08-31 DIAGNOSIS — I119 Hypertensive heart disease without heart failure: Secondary | ICD-10-CM

## 2018-08-31 DIAGNOSIS — K219 Gastro-esophageal reflux disease without esophagitis: Secondary | ICD-10-CM | POA: Diagnosis not present

## 2018-08-31 DIAGNOSIS — I2510 Atherosclerotic heart disease of native coronary artery without angina pectoris: Secondary | ICD-10-CM | POA: Diagnosis not present

## 2018-08-31 DIAGNOSIS — F17290 Nicotine dependence, other tobacco product, uncomplicated: Secondary | ICD-10-CM | POA: Diagnosis not present

## 2018-08-31 DIAGNOSIS — Z955 Presence of coronary angioplasty implant and graft: Secondary | ICD-10-CM | POA: Diagnosis not present

## 2018-08-31 DIAGNOSIS — I200 Unstable angina: Secondary | ICD-10-CM | POA: Diagnosis not present

## 2018-08-31 DIAGNOSIS — I25110 Atherosclerotic heart disease of native coronary artery with unstable angina pectoris: Secondary | ICD-10-CM | POA: Diagnosis not present

## 2018-08-31 DIAGNOSIS — Z794 Long term (current) use of insulin: Secondary | ICD-10-CM | POA: Diagnosis not present

## 2018-08-31 DIAGNOSIS — I255 Ischemic cardiomyopathy: Secondary | ICD-10-CM | POA: Diagnosis not present

## 2018-08-31 DIAGNOSIS — E782 Mixed hyperlipidemia: Secondary | ICD-10-CM | POA: Diagnosis not present

## 2018-08-31 DIAGNOSIS — K2270 Barrett's esophagus without dysplasia: Secondary | ICD-10-CM | POA: Diagnosis not present

## 2018-08-31 LAB — BASIC METABOLIC PANEL
Anion gap: 7 (ref 5–15)
BUN: 17 mg/dL (ref 8–23)
CO2: 29 mmol/L (ref 22–32)
Calcium: 9.1 mg/dL (ref 8.9–10.3)
Chloride: 102 mmol/L (ref 98–111)
Creatinine, Ser: 0.82 mg/dL (ref 0.61–1.24)
GFR calc Af Amer: >60 mL/min (ref >60)
GFR calc non Af Amer: >60 mL/min (ref >60)
Glucose, Bld: 195 mg/dL — ABNORMAL HIGH (ref 70–99)
POTASSIUM: 4.1 mmol/L (ref 3.5–5.1)
Sodium: 138 mmol/L (ref 135–145)

## 2018-08-31 LAB — CBC
HCT: 39.1 % (ref 39.0–52.0)
HEMOGLOBIN: 12.8 g/dL — AB (ref 13.0–17.0)
MCH: 28.1 pg (ref 26.0–34.0)
MCHC: 32.7 g/dL (ref 30.0–36.0)
MCV: 85.9 fL (ref 80.0–100.0)
Platelets: 220 10*3/uL (ref 150–400)
RBC: 4.55 MIL/uL (ref 4.22–5.81)
RDW: 12.6 % (ref 11.5–15.5)
WBC: 7.2 10*3/uL (ref 4.0–10.5)
nRBC: 0 % (ref 0.0–0.2)

## 2018-08-31 LAB — GLUCOSE, CAPILLARY
GLUCOSE-CAPILLARY: 275 mg/dL — AB (ref 70–99)
Glucose-Capillary: 199 mg/dL — ABNORMAL HIGH (ref 70–99)
Glucose-Capillary: 256 mg/dL — ABNORMAL HIGH (ref 70–99)
Glucose-Capillary: 188 mg/dL — ABNORMAL HIGH (ref 70–99)

## 2018-08-31 LAB — HEPARIN LEVEL (UNFRACTIONATED): Heparin Unfractionated: 0.45 IU/mL (ref 0.30–0.70)

## 2018-08-31 MED ORDER — SODIUM CHLORIDE 0.9 % WEIGHT BASED INFUSION
3.0000 mL/kg/h | INTRAVENOUS | Status: DC
  Administered 2018-09-01: 3 mL/kg/h via INTRAVENOUS

## 2018-08-31 MED ORDER — SODIUM CHLORIDE 0.9 % WEIGHT BASED INFUSION
1.0000 mL/kg/h | INTRAVENOUS | Status: DC
  Administered 2018-09-01: 1 mL/kg/h via INTRAVENOUS

## 2018-08-31 MED ORDER — SODIUM CHLORIDE 0.9% FLUSH: 3.0000 mL | INTRAVENOUS | Status: DC | PRN

## 2018-08-31 NOTE — Progress Notes (Signed)
Patient refuses to sign consent for cardiac cath until he has spoken to cardiologist. Patient advised this will occur in the morning. Patient agreeable to this. No other complaints at this time. Rn will continue to monitor patient.

## 2018-08-31 NOTE — Progress Notes (Addendum)
PROGRESS NOTE  Gary Wheeler HKV:[id number] DOB: 1945-09-17 DOA: 08/30/2018 PCP: [facility]  Brief History:  73 year old male with a history of coronary artery disease with history of STEMI June 2016 with DES x 2 to LAD, ischemic cardiomyopathy, diabetes mellitus, hypertension, hyperlipidemia, GERD presenting with left-sided chest discomfort that began approximately 1 hour after eating a large meal on the evening of 08/29/2018.  The patient was recent admitted to the hospital from 08/24/2018 through 08/25/2018 for chest pain.  Patient was seen by cardiology, and no further work-up was recommended at that time.  The patient has been doing well after discharge without any chest discomfort or shortness of breath until the evening of 08/29/2018 after he ate his dinner.  The patient took 2 nitroglycerin at home without any relief.  As result, he presents emergency department for further evaluation.  He stated that he belched on numerous occasions.  Initially it did not relieve his chest discomfort, but he subsequently stated that after "big belch" his chest discomfort was relieved.  He denied any shortness of breath, dizziness, or syncope.  On his way to the emergency department, the patient had some left shoulder discomfort and pain.  Again, he had another bout which relieved the shoulder discomfort.  In the emergency department, the patient was hemodynamically stable saturating 99% on room air.  BMP and CBC were unremarkable.  Troponin was 0.03 increasing to 0.10.  The patient was started on a heparin drip.  He currently is pain-free at the time of my evaluation.  Assessment/Plan: Unstable Angina -Symptoms appeared mostly atypical, related to GI etiology but has has CAD/PCI, now with elevated troponin-->unstable angina -Continue cycling troponins--peaked at 0.14 -Continue IV heparin -appreciate cardiology consult-->transfer to [facility] heart cath -Increase Imdur to 60 mg  daily -08/25/2018 echo EF 55 to 60%, grade 1 DD, mild HK of the basal inferior myocardium -Personally reviewed EKG--sinus rhythm, nonspecific T wave changes -Continue aspirin  Diabetes mellitus type 2, uncontrolled with hyperglycemia -Hemoglobin A1c -NovoLog sliding scale -keep lantus at 20 units as pt will be npo after [place] for cath -Holding metformin  Hyperlipidemia -Continue statin -08/24/2018 LDL 60  Essential hypertension -Continue amlodipine and metoprolol tartrate -hold lisinopril and HCTZ in preparation for cath   Disposition Plan:   Transfer to [place] for cath Family Communication:  No Family at bedside  Consultants:  cardiology  Code Status:  FULL  DVT Prophylaxis: IV Heparin    Procedures: As Listed in Progress Note Above  Antibiotics: None      Subjective: Patient denies fevers, chills, headache, chest pain, dyspnea, nausea, vomiting, diarrhea, abdominal pain, dysuria, hematuria, hematochezia, and melena.   Objective: Vitals:   08/30/18 2153 08/31/18 0224 08/31/18 0650 08/31/18 0904  BP: 127/85 120/83 (!) 141/93 132/85  Pulse: 72 60 (!) 58 65  Resp: 20 16 17    Temp: 98.3 F (36.8 C) 98 F (36.7 C) 98 F (36.7 C)   TempSrc: Oral Oral Oral   SpO2: 97% 94% 95%   Weight:      Height:        Intake/Output Summary (Last 24 hours) at 08/31/2018 1201 Last data filed at 08/31/2018 0057 Gross per 24 hour  Intake 868.55 ml  Output 300 ml  Net 568.55 ml   Weight change: -2.113 kg Exam:   General:  Pt is alert, follows commands appropriately, not in acute distress  HEENT: No icterus, No thrush, No  neck mass, [place]/AT  Cardiovascular: RRR, S1/S2, no rubs, no gallops  Respiratory: right basilar crackle. No wheeze; L-CTA  Abdomen: Soft/+BS, non tender, non distended, no guarding  Extremities: No edema, No lymphangitis, No petechiae, No rashes, no synovitis   Data Reviewed: I have personally reviewed following labs and imaging  studies Basic Metabolic Panel: Recent Labs  Lab 08/30/18 0200 08/31/18 0443  NA 135 138  K 4.0 4.1  CL 99 102  CO2 27 29  GLUCOSE 241* 195*  BUN 16 17  CREATININE 0.78 0.82  CALCIUM 9.4 9.1  MG 1.8  --    Liver Function Tests: No results for input(s): AST, ALT, ALKPHOS, BILITOT, PROT, ALBUMIN in the last 168 hours. No results for input(s): LIPASE, AMYLASE in the last 168 hours. No results for input(s): AMMONIA in the last 168 hours. Coagulation Profile: Recent Labs  Lab 08/30/18 0222  INR 1.14   CBC: Recent Labs  Lab 08/30/18 0200 08/31/18 0443  WBC 9.6 7.2  HGB 13.6 12.8*  HCT 41.4 39.1  MCV 85.2 85.9  PLT 241 220   Cardiac Enzymes: Recent Labs  Lab 08/30/18 0200 08/30/18 0529 08/30/18 0803 08/30/18 1402 08/30/18 2023  TROPONINI 0.03* 0.10* 0.14* 0.14* 0.11*   BNP: Invalid input(s): POCBNP CBG: Recent Labs  Lab 08/30/18 1256 08/30/18 1551 08/30/18 2218 08/31/18 0748 08/31/18 1113  GLUCAP 260* 244* 237* 199* 256*   HbA1C: Recent Labs    08/30/18 0200  HGBA1C 8.7*   Urine analysis: No results found for: COLORURINE, APPEARANCEUR, LABSPEC, PHURINE, GLUCOSEU, HGBUR, BILIRUBINUR, KETONESUR, PROTEINUR, UROBILINOGEN, NITRITE, LEUKOCYTESUR Sepsis Labs: @LABRCNTIP (procalcitonin:4,lacticidven:4) )No results found for this or any previous visit (from the past 240 hour(s)).   Scheduled Meds: . amLODipine  5 mg Oral Daily  . aspirin EC  81 mg Oral Daily  . cholecalciferol  4,000 Units Oral Daily  . insulin aspart  0-15 Units Subcutaneous TID WC  . insulin aspart  0-5 Units Subcutaneous QHS  . insulin glargine  20 Units Subcutaneous QHS  . isosorbide mononitrate  60 mg Oral Daily  . metoprolol tartrate  12.5 mg Oral BID  . pantoprazole  40 mg Oral Daily  . rosuvastatin  5 mg Oral QHS  . vitamin B-12  2,000 mcg Oral Daily   Continuous Infusions: . heparin 1,200 Units/hr (08/30/18 2012)    Procedures/Studies: Dg Chest 2 View  Result Date:  08/24/2018 CLINICAL DATA:  Chest pain and indigestion for 3 days EXAM: CHEST - 2 VIEW COMPARISON:  None. FINDINGS: Normal heart size. Lungs clear. No pneumothorax. No pleural effusion. IMPRESSION: No active cardiopulmonary disease. Electronically Signed   By: Jolaine ClickArthur  Hoss M.D.   On: 08/24/2018 10:06    Catarina Hartshornavid Kiylee Thoreson, DO  [facility] Pager [phone]  If 7PM-7AM, please contact night-coverage [email or web address] Password [email or web address] 08/31/2018, 12:01 PM   LOS: 0 days

## 2018-08-31 NOTE — Progress Notes (Signed)
ANTICOAGULATION CONSULT NOTE - Follow-Up  Pharmacy Consult for heparin Indication: chest pain/ACS  Allergies  Allergen Reactions  . Lipitor [Atorvastatin] Diarrhea  . Pravastatin Diarrhea    Patient Measurements: Height: 5\' 10"  (177.8 cm) Weight: 195 lb 4.8 oz (88.6 kg) IBW/kg (Calculated) : 73 HEPARIN DW (KG): 88.6   Vital Signs: Temp: 98 F (36.7 C) (02/03 0650) Temp Source: Oral (02/03 0650) BP: 132/85 (02/03 0904) Pulse Rate: 65 (02/03 0904)  Labs: Recent Labs    08/30/18 0200 08/30/18 0222  08/30/18 0803 08/30/18 0928 08/30/18 1402 08/30/18 2023 08/31/18 0443  HGB 13.6  --   --   --   --   --   --  12.8*  HCT 41.4  --   --   --   --   --   --  39.1  PLT 241  --   --   --   --   --   --  220  APTT  --  28  --   --   --   --   --   --   LABPROT  --  14.5  --   --   --   --   --   --   INR  --  1.14  --   --   --   --   --   --   HEPARINUNFRC  --   --   --   --  0.51  --   --  0.45  CREATININE 0.78  --   --   --   --   --   --  0.82  TROPONINI 0.03*  --    < > 0.14*  --  0.14* 0.11*  --    < > = values in this interval not displayed.   Estimated Creatinine Clearance: 91.2 mL/min (by C-G formula based on SCr of 0.82 mg/dL).  Medical History: Past Medical History:  Diagnosis Date  . Barrett's esophagus 2008  . CAD (coronary artery disease) 01/09/15   a. cath 01/09/15 PCI with 2 DES stents in LAD/1 in Dx with comcomitant CAD in distal LAD, RI.   . Diabetes mellitus without complication (HCC)   . Hypercholesterolemia   . Hypertension   . STEMI (ST elevation myocardial infarction) (HCC) 01/09/15    Medications:  Infusions:  . heparin 1,200 Units/hr (08/30/18 2012)    Assessment: Patient arrived to ED with chest pain.  Was admitted 2 days ago for the same. Starting heparin gtt for chest pain/ NSTEMI.  Heparin level is within goal range this morning, at 0.45 IU/mL.   Goal of Therapy:  Heparin level 0.3-0.7 units/ml   Plan:  Continue  heparin infusion at  1200 units/hr Check anti-Xa level  daily while on heparin Continue to monitor H&H and platelets    Tama Highamara Toa Mia, Pharm. D. Clinical Pharmacist 08/31/2018 9:35 AM

## 2018-08-31 NOTE — Progress Notes (Signed)
Patient to be transferred to [facility]. Patient and family aware of transfer to [facility]. Report called and given to Leafy Half RN. All questions were answered and no further questions at this time. Pt in stable condition and in no acute distress at time of discharge. Pt will be transported via [facility]. Report given to [facility].

## 2018-08-31 NOTE — H&P (View-Only) (Signed)
Cardiology Consultation:   Patient ID: Gary Wheeler MRN: [id number]; DOB: 01/03/1946  Admit date: 08/30/2018 Date of Consult: 08/31/2018  Primary Care Provider: [facility] Primary [facility]ologist: [facility] Primary Electrophysiologist:  None    Patient Profile:   Gary Wheeler is a 72 y.o. male with a hx of CAD who is being seen today for the evaluation of chest pain at the request of Dr Tat.  History of Present Illness:   Gary Wheeler is a pleasant 72-year-old married male from [place], [place] veteran, he was in the [employer] in 1966-1968.  [facility]  He has partial agent orange disability.  He has known CAD and had an STEMI in June 2016.  Catheterization then revealed multivessel disease.  Patient underwent PCI with overlapping LAD DES stents and Dx2 DES.  There was distal LAD thrombus, 70% ramus intermedius, and a 90% PLB stenosis that was treated medically.  He ejection fraction was 45 to 50%.  He was supposed to follow-up in our [place] office but that never happened.  He did see Dr Branch once in 2016 as an OP. He has [facility]  Since we saw him last in 2016 he has not had a stress test or catheterization.  The patient presented to [facility] last week with left shoulder pain.  He was admitted and ruled out for an MI.  His echocardiogram showed his ejection fraction had normalized.  The plan was continued medical therapy and the patient was to [facility].  Two nights ago he was at home and then had a large dinner.  He had some indigestion after this and some left shoulder discomfort.  His symptoms were partially relieved with belching.  His left shoulder discomfort continued and he took nitroglycerin x2 with no real relief.  On his way to the emergency room he said he belched 1 time and that his symptoms resolved.  This admission his troponins are positive with a peak troponin of 0.14.  He has had some  residual left shoulder pain since admission.  He tells me his symptoms are similar to his pre-PCI symptoms.  Past Medical History:  Diagnosis Date  . Barrett's esophagus 2008  . CAD (coronary artery disease) 01/09/15   a. cath 01/09/15 PCI with 2 DES stents in LAD/1 in Dx with comcomitant CAD in distal LAD, RI.   . Diabetes mellitus without complication (HCC)   . Hypercholesterolemia   . Hypertension   . STEMI (ST elevation myocardial infarction) (HCC) 01/09/15    Past Surgical History:  Procedure Laterality Date  . BIOPSY  03/18/2018   Procedure: BIOPSY;  Surgeon: Rourk, Robert M, MD;  Location: [facility] SUITE;  Service: Endoscopy;;  esophageal biopsies  . CARDIAC CATHETERIZATION N/A 01/09/2015   Procedure: Left Heart Cath and Coronary Angiography;  Surgeon: David W Harding, MD;  Location: [facility] INVASIVE CV LAB;  Service: Cardiovascular;  Laterality: N/A;  . CARDIAC CATHETERIZATION  01/09/2015   with 3 stents  . COLONOSCOPY  2008   Dr. Anwar: normal  . COLONOSCOPY N/A 03/18/2018   diverticulosis in sigmoid and descending  . ESOPHAGOGASTRODUODENOSCOPY  2008   Dr. Anwar: Barrett's. Path with a minute fragment of squamous cell epithelium suggestive of dysplasia  . ESOPHAGOGASTRODUODENOSCOPY N/A 03/18/2018   DR. Rourk: Barrett's esophagus, small hiatal hernia, minimal polypoid gastric mucosa,normal duodenum, surveillance in 3 years  . left ORIF ankle         Home Medications:  Prior to Admission medications   Medication Sig Start Date End Date Taking? Authorizing Provider  amLODipine (NORVASC) 10 MG tablet Take 5 mg by mouth daily.    Yes [provider]  aspirin EC 81 MG tablet Take 81 mg by mouth daily.   Yes [provider]  cholecalciferol (VITAMIN D) 1000 UNITS tablet Take 4,000 Units by mouth daily.    Yes [provider]  insulin aspart (NOVOLOG FLEXPEN) 100 UNIT/ML FlexPen Inject 10-20 Units into the skin 3 (three) times daily with meals. Per sliding scale    Yes [provider]  insulin glargine (LANTUS) 100 UNIT/ML injection Inject 24 Units into the skin daily.   Yes [provider]  isosorbide mononitrate (IMDUR) 30 MG 24 hr tablet Take 1 tablet (30 mg total) by mouth daily. 01/11/15  Yes Bhagat, Bhavinkumar, PA  ketotifen (ZADITOR) 0.025 % ophthalmic solution Place 1 drop into both eyes 2 (two) times daily as needed (for dry eyes).   Yes [provider]  lisinopril-hydrochlorothiazide (PRINZIDE,ZESTORETIC) 20-12.5 MG per tablet Take 1 tablet by mouth 2 (two) times daily.    Yes [provider]  loratadine (CLARITIN) 10 MG tablet Take 10 mg by mouth daily as needed for allergies.   Yes [provider]  metFORMIN (GLUCOPHAGE) 500 MG tablet Take 500 mg by mouth 2 (two) times daily with a meal.   Yes [provider]  metoprolol tartrate (LOPRESSOR) 25 MG tablet Take 12.5 mg by mouth 2 (two) times daily.   Yes [provider]  omeprazole (PRILOSEC) 20 MG capsule Take 20 mg by mouth daily.   Yes [provider]  potassium chloride (K-DUR,KLOR-CON) 10 MEQ tablet Take 10 mEq by mouth daily as needed (for cramping).   Yes [provider]  rosuvastatin (CRESTOR) 5 MG tablet Take 5 mg by mouth at bedtime.    Yes [provider]  vitamin B-12 (CYANOCOBALAMIN) 1000 MCG tablet Take 2,000 mcg by mouth daily.    Yes [provider]  nitroGLYCERIN (NITROSTAT) 0.4 MG SL tablet Place 1 tablet (0.4 mg total) under the tongue every 5 (five) minutes x 3 doses as needed for chest pain. 01/11/15   Bhagat, Bhavinkumar, PA    Inpatient Medications: Scheduled Meds: . amLODipine  5 mg Oral Daily  . aspirin EC  81 mg Oral Daily  . cholecalciferol  4,000 Units Oral Daily  . insulin aspart  0-15 Units Subcutaneous TID WC  . insulin aspart  0-5 Units Subcutaneous QHS  . insulin glargine  20 Units Subcutaneous QHS  . isosorbide mononitrate  60 mg Oral Daily  . metoprolol tartrate   12.5 mg Oral BID  . pantoprazole  40 mg Oral Daily  . rosuvastatin  5 mg Oral QHS  . vitamin B-12  2,000 mcg Oral Daily   Continuous Infusions: . heparin 1,200 Units/hr (08/30/18 2012)   PRN Meds: acetaminophen **OR** acetaminophen, acetaminophen **OR** acetaminophen, ketotifen, loratadine, ondansetron (ZOFRAN) IV, ondansetron **OR** [DISCONTINUED] ondansetron (ZOFRAN) IV  Allergies:    Allergies  Allergen Reactions  . Lipitor [Atorvastatin] Diarrhea  . Pravastatin Diarrhea    Social History:   Social History   Socioeconomic History  . Marital status: Married    Spouse name: Not on file  . Number of children: Not on file  . Years of education: Not on file  . Highest education level: Not on file  Occupational History  . Occupation: Retired, but works 2 days/week gas station  Social   Needs  . Financial resource strain: Not on file  . Food insecurity:    Worry: Not on file    Inability: Not on file  . Transportation needs:    Medical: Not on file    Non-medical: Not on file  Tobacco Use  . Smoking status: Current Some Day Smoker    Types: Cigars    Last attempt to quit: 07/30/1999    Years since quitting: 19.1  . Smokeless tobacco: Never Used  Substance and Sexual Activity  . Alcohol use: Yes    Alcohol/week: 0.0 standard drinks    Comment: rare, occasional mixed drink   . Drug use: No  . Sexual activity: Not on file  Lifestyle  . Physical activity:    Days per week: Not on file    Minutes per session: Not on file  . Stress: Not on file  Relationships  . Social connections:    Talks on phone: Not on file    Gets together: Not on file    Attends religious service: Not on file    Active member of club or organization: Not on file    Attends meetings of clubs or organizations: Not on file    Relationship status: Not on file  . Intimate partner violence:    Fear of current or ex partner: Not on file    Emotionally abused: Not on file    Physically abused: Not on  file    Forced sexual activity: Not on file  Other Topics Concern  . Not on file  Social History Narrative   Lives in [place], [place] with wife.    Family History:   Family History  Problem Relation Age of Onset  . Coronary artery disease Father   . Coronary artery disease Paternal Grandfather   . Prostate cancer Brother   . Colon cancer Neg Hx   . Colon polyps Neg Hx      ROS:  Please see the history of present illness.   Colonoscopy and endoscopy done Aug 2019 All other ROS reviewed and negative.     Physical Exam/Data:   Vitals:   08/30/18 2153 08/31/18 0224 08/31/18 0650 08/31/18 0904  BP: 127/85 120/83 (!) 141/93 132/85  Pulse: 72 60 (!) 58 65  Resp: 20 16 17   Temp: 98.3 F (36.8 C) 98 F (36.7 C) 98 F (36.7 C)   TempSrc: Oral Oral Oral   SpO2: 97% 94% 95%   Weight:      Height:        Intake/Output Summary (Last 24 hours) at 08/31/2018 1018 Last data filed at 08/31/2018 0057 Gross per 24 hour  Intake 868.55 ml  Output 300 ml  Net 568.55 ml   Last 3 Weights 08/30/2018 08/30/2018 08/24/2018  Weight (lbs) 195 lb 4.8 oz 199 lb 15.3 oz 200 lb  Weight (kg) 88.587 kg 90.7 kg 90.719 kg     Body mass index is 28.02 kg/m.  General:  Well nourished, well developed, in no acute distress HEENT: normal Lymph: no adenopathy Neck: no JVD Endocrine:  No thryomegaly Vascular: No carotid bruits; FA pulses 2+ bilaterally without bruits  Cardiac:  normal S1, S2; RRR; no murmur  Lungs:  clear to auscultation bilaterally, no wheezing, rhonchi or rales  Abd: soft, nontender, no hepatomegaly  Ext: no edema Musculoskeletal:  No deformities, BUE and BLE strength normal and equal Skin: warm and dry  Neuro:  CNs 2-12 intact, no focal abnormalities noted Psych:  Normal affect     EKG:  The EKG was personally reviewed and demonstrates:  NSR, 60 Telemetry:  Telemetry was personally reviewed and demonstrates:  NSR  Relevant CV Studies: Echo 08/25/2018 Study Conclusions  - Left  ventricle: Mild basal septal hypertrophy. Systolic function   was normal. The estimated ejection fraction was in the range of   55% to 60%. Doppler parameters are consistent with abnormal left   ventricular relaxation (grade 1 diastolic dysfunction). - Regional wall motion abnormality: Mild hypokinesis of the basal   inferior myocardium.  Impressions:  - LVEF has normalized since prior study in 2016.   Laboratory Data:  Chemistry Recent Labs  Lab 08/30/18 0200 08/31/18 0443  NA 135 138  K 4.0 4.1  CL 99 102  CO2 27 29  GLUCOSE 241* 195*  BUN 16 17  CREATININE 0.78 0.82  CALCIUM 9.4 9.1  GFRNONAA >60 >60  GFRAA >60 >60  ANIONGAP 9 7    No results for input(s): PROT, ALBUMIN, AST, ALT, ALKPHOS, BILITOT in the last 168 hours. Hematology Recent Labs  Lab 08/30/18 0200 08/31/18 0443  WBC 9.6 7.2  RBC 4.86 4.55  HGB 13.6 12.8*  HCT 41.4 39.1  MCV 85.2 85.9  MCH 28.0 28.1  MCHC 32.9 32.7  RDW 12.7 12.6  PLT 241 220   Cardiac Enzymes Recent Labs  Lab 08/25/18 0316 08/30/18 0200 08/30/18 0529 08/30/18 0803 08/30/18 1402 08/30/18 2023  TROPONINI <0.03 0.03* 0.10* 0.14* 0.14* 0.11*   No results for input(s): TROPIPOC in the last 168 hours.  BNPNo results for input(s): BNP, PROBNP in the last 168 hours.  DDimer No results for input(s): DDIMER in the last 168 hours.  Radiology/Studies:  No results found.  Assessment and Plan:   NSTEMI- Lt shoulder pain, troponin 0.14  CAD- prior STEMI June 2016- treated with LAD (x2) and Dx2 PCI and DES.  Residual RI, PLB disease treated medically.  HTN- Echo last week- normal LVF, continue antihypertensive medications (hold Prinzide pre cath)  IDDM- Per primary service-hold Glucophage  Dyslipidemia- continue low dose statin Rx.  He had prior intolerance to Pravachol and Lipitor. LDL 60 on 08/24/2018  Agent Orange exposure [facility] disability through [facility]  Plan-  Diagnostic cath. He ate breakfast this am. Transfer to  [facility] today-cath tomorrow. The patient understands that risks included but are not limited to stroke (1 in 1000), death (1 in 1000), kidney failure [usually temporary] (1 in 500), bleeding (1 in 200), allergic reaction [possibly serious] (1 in 200).  The patient understands and agrees to proceed.    For questions or updates, please contact [facility] Please consult [email or web address] for contact info under    Signed, Luke Kilroy, PA-C  08/31/2018 10:18 AM    Attending note:  Patient seen and examined.  I reviewed his records and discussed the case with Mr. Kilroy PA-C.  Gary Wheeler has a history of CAD status post STEMI in 2016 managed with overlapping DES to the LAD and DES to the diagonal.  He saw Dr. Branch in the office back in 2016 but [facility].  He presents with recent recurring left shoulder discomfort, was hospitalized last week for evaluation, troponin I levels were negative and echocardiogram showed LVEF 55 to 60% with basal inferior hypokinesis.  He was seen in consultation by Dr. Koneswaran with outpatient follow-up planned.  He has continued to have left shoulder discomfort, more recently an episode of chest pain after eating associated with belching, he   took nitroglycerin without much benefit.  Symptoms reminded him of prior ACS and he came back to the hospital for repeat assessment.  On examination this morning he appears comfortable, no active chest or left shoulder discomfort.  He is afebrile, heart rate in the 60s in sinus rhythm by telemetry which I personally reviewed.  Systolic blood pressure ranging 120-140.  Lungs are clear without labored breathing, cardiac exam reveals RRR without gallop.  Lab work shows potassium 4.1, BUN 17, creatinine 0.82, troponin I up to 0.14, hemoglobin 12.8, platelets 220.  Chest x-ray reports no acute cardiopulmonary process.  I personally reviewed his ECG from 08/30/2018 which shows a sinus rhythm  with low voltage, PAC, nonspecific T wave changes.  Patient presents with recent recurring left shoulder and upper chest discomfort concerning for unstable angina.  Troponin I levels have trended abnormally during his most recent admission.  He has known CAD with prior DES intervention to the LAD and diagonal in 2016 as outlined above.  Plan is for him to be transferred to the hospitalist service at [facility] in anticipation of a diagnostic cardiac catheterization tomorrow, we will follow him in consultation.  Risks and benefits discussed and he is in agreement to proceed.  He ate breakfast this morning.  Currently chest pain-free.  Continue heparin, aspirin, Imdur, Lopressor, and Crestor.  Samuel G. McDowell, M.D., F.A.C.C.  

## 2018-08-31 NOTE — Consult Note (Addendum)
Cardiology Consultation:   Patient ID: Gary Wheeler MRN: [id number]; DOB: 12-Jan-1946  Admit date: 08/30/2018 Date of Consult: 08/31/2018  Primary Care Provider: [facility] Primary Cardiologist: [facility] Primary Electrophysiologist:  None    Patient Profile:   Gary Wheeler is a 73 y.o. male with a hx of CAD who is being seen today for the evaluation of chest pain at the request of Dr Tat.  History of Present Illness:   Gary Wheeler is a pleasant 72 year old married male from [place], [place] veteran, he was in the [employer] in [phone].  He is followed by the [place].  He has partial agent orange disability.  He has known CAD and had an STEMI in June 2016.  Catheterization then revealed multivessel disease.  Patient underwent PCI with overlapping LAD DES stents and Dx2 DES.  There was distal LAD thrombus, 70% ramus intermedius, and a 90% PLB stenosis that was treated medically.  He ejection fraction was 45 to 50%.  He was supposed to follow-up in our [place] office but that never happened.  He did see Dr Wyline Mood once in 2016 as an OP. He has since been followed by a cardiologist at the [place].  Since we saw him last in 2016 he has not had a stress test or catheterization.  The patient presented to [facility] last week with left shoulder pain.  He was admitted and ruled out for an MI.  His echocardiogram showed his ejection fraction had normalized.  The plan was continued medical therapy and the patient was to follow-up with the cardiologist at the [facility].  Two nights ago he was at home and then had a large dinner.  He had some indigestion after this and some left shoulder discomfort.  His symptoms were partially relieved with belching.  His left shoulder discomfort continued and he took nitroglycerin x2 with no real relief.  On his way to the emergency room he said he belched 1 time and that his symptoms resolved.  This admission his troponins are positive with a peak troponin of 0.14.  He has had some  residual left shoulder pain since admission.  He tells me his symptoms are similar to his pre-PCI symptoms.  Past Medical History:  Diagnosis Date  . Barrett's esophagus 2008  . CAD (coronary artery disease) 01/09/15   a. cath 01/09/15 PCI with 2 DES stents in LAD/1 in Dx with comcomitant CAD in distal LAD, RI.   . Diabetes mellitus without complication (HCC)   . Hypercholesterolemia   . Hypertension   . STEMI (ST elevation myocardial infarction) (HCC) 01/09/15    Past Surgical History:  Procedure Laterality Date  . BIOPSY  03/18/2018   Procedure: BIOPSY;  Surgeon: Corbin Ade, MD;  Location: [facility] SUITE;  Service: Endoscopy;;  esophageal biopsies  . CARDIAC CATHETERIZATION N/A 01/09/2015   Procedure: Left Heart Cath and Coronary Angiography;  Surgeon: Marykay Lex, MD;  Location: [facility] INVASIVE CV LAB;  Service: Cardiovascular;  Laterality: N/A;  . CARDIAC CATHETERIZATION  01/09/2015   with 3 stents  . COLONOSCOPY  2008   Dr. Linna Darner: normal  . COLONOSCOPY N/A 03/18/2018   diverticulosis in sigmoid and descending  . ESOPHAGOGASTRODUODENOSCOPY  2008   Dr. Linna Darner: Barrett's. Path with a minute fragment of squamous cell epithelium suggestive of dysplasia  . ESOPHAGOGASTRODUODENOSCOPY N/A 03/18/2018   DR. Rourk: Barrett's esophagus, small hiatal hernia, minimal polypoid gastric mucosa,normal duodenum, surveillance in 3 years  . left ORIF ankle  Home Medications:  Prior to Admission medications   Medication Sig Start Date End Date Taking? Authorizing Provider  amLODipine (NORVASC) 10 MG tablet Take 5 mg by mouth daily.    Yes [provider]  aspirin EC 81 MG tablet Take 81 mg by mouth daily.   Yes [provider]  cholecalciferol (VITAMIN D) 1000 UNITS tablet Take 4,000 Units by mouth daily.    Yes [provider]  insulin aspart (NOVOLOG FLEXPEN) 100 UNIT/ML FlexPen Inject 10-20 Units into the skin 3 (three) times daily with meals. Per sliding scale    Yes [provider]  insulin glargine (LANTUS) 100 UNIT/ML injection Inject 24 Units into the skin daily.   Yes [provider]  isosorbide mononitrate (IMDUR) 30 MG 24 hr tablet Take 1 tablet (30 mg total) by mouth daily. 01/11/15  Yes Bhagat, Bhavinkumar, PA  ketotifen (ZADITOR) 0.025 % ophthalmic solution Place 1 drop into both eyes 2 (two) times daily as needed (for dry eyes).   Yes [provider]  lisinopril-hydrochlorothiazide (PRINZIDE,ZESTORETIC) 20-12.5 MG per tablet Take 1 tablet by mouth 2 (two) times daily.    Yes [provider]  loratadine (CLARITIN) 10 MG tablet Take 10 mg by mouth daily as needed for allergies.   Yes [provider]  metFORMIN (GLUCOPHAGE) 500 MG tablet Take 500 mg by mouth 2 (two) times daily with a meal.   Yes [provider]  metoprolol tartrate (LOPRESSOR) 25 MG tablet Take 12.5 mg by mouth 2 (two) times daily.   Yes [provider]  omeprazole (PRILOSEC) 20 MG capsule Take 20 mg by mouth daily.   Yes [provider]  potassium chloride (K-DUR,KLOR-CON) 10 MEQ tablet Take 10 mEq by mouth daily as needed (for cramping).   Yes [provider]  rosuvastatin (CRESTOR) 5 MG tablet Take 5 mg by mouth at bedtime.    Yes [provider]  vitamin B-12 (CYANOCOBALAMIN) 1000 MCG tablet Take 2,000 mcg by mouth daily.    Yes [provider]  nitroGLYCERIN (NITROSTAT) 0.4 MG SL tablet Place 1 tablet (0.4 mg total) under the tongue every 5 (five) minutes x 3 doses as needed for chest pain. 01/11/15   Manson Passey, PA    Inpatient Medications: Scheduled Meds: . amLODipine  5 mg Oral Daily  . aspirin EC  81 mg Oral Daily  . cholecalciferol  4,000 Units Oral Daily  . insulin aspart  0-15 Units Subcutaneous TID WC  . insulin aspart  0-5 Units Subcutaneous QHS  . insulin glargine  20 Units Subcutaneous QHS  . isosorbide mononitrate  60 mg Oral Daily  . metoprolol tartrate   12.5 mg Oral BID  . pantoprazole  40 mg Oral Daily  . rosuvastatin  5 mg Oral QHS  . vitamin B-12  2,000 mcg Oral Daily   Continuous Infusions: . heparin 1,200 Units/hr (08/30/18 2012)   PRN Meds: acetaminophen **OR** acetaminophen, acetaminophen **OR** acetaminophen, ketotifen, loratadine, ondansetron (ZOFRAN) IV, ondansetron **OR** [DISCONTINUED] ondansetron (ZOFRAN) IV  Allergies:    Allergies  Allergen Reactions  . Lipitor [Atorvastatin] Diarrhea  . Pravastatin Diarrhea    Social History:   Social History   Socioeconomic History  . Marital status: Married    Spouse name: Not on file  . Number of children: Not on file  . Years of education: Not on file  . Highest education level: Not on file  Occupational History  . Occupation: Retired, but works 2 days/week gas station  Social  Needs  . Financial resource strain: Not on file  . Food insecurity:    Worry: Not on file    Inability: Not on file  . Transportation needs:    Medical: Not on file    Non-medical: Not on file  Tobacco Use  . Smoking status: Current Some Day Smoker    Types: Cigars    Last attempt to quit: 07/30/1999    Years since quitting: 19.1  . Smokeless tobacco: Never Used  Substance and Sexual Activity  . Alcohol use: Yes    Alcohol/week: 0.0 standard drinks    Comment: rare, occasional mixed drink   . Drug use: No  . Sexual activity: Not on file  Lifestyle  . Physical activity:    Days per week: Not on file    Minutes per session: Not on file  . Stress: Not on file  Relationships  . Social connections:    Talks on phone: Not on file    Gets together: Not on file    Attends religious service: Not on file    Active member of club or organization: Not on file    Attends meetings of clubs or organizations: Not on file    Relationship status: Not on file  . Intimate partner violence:    Fear of current or ex partner: Not on file    Emotionally abused: Not on file    Physically abused: Not on  file    Forced sexual activity: Not on file  Other Topics Concern  . Not on file  Social History Narrative   Lives in [place], [place] with wife.    Family History:   Family History  Problem Relation Age of Onset  . Coronary artery disease Father   . Coronary artery disease Paternal Grandfather   . Prostate cancer Brother   . Colon cancer Neg Hx   . Colon polyps Neg Hx      ROS:  Please see the history of present illness.   Colonoscopy and endoscopy done Aug 2019 All other ROS reviewed and negative.     Physical Exam/Data:   Vitals:   08/30/18 2153 08/31/18 0224 08/31/18 0650 08/31/18 0904  BP: 127/85 120/83 (!) 141/93 132/85  Pulse: 72 60 (!) 58 65  Resp: 20 16 17    Temp: 98.3 F (36.8 C) 98 F (36.7 C) 98 F (36.7 C)   TempSrc: Oral Oral Oral   SpO2: 97% 94% 95%   Weight:      Height:        Intake/Output Summary (Last 24 hours) at 08/31/2018 1018 Last data filed at 08/31/2018 0057 Gross per 24 hour  Intake 868.55 ml  Output 300 ml  Net 568.55 ml   Last 3 Weights 08/30/2018 08/30/2018 08/24/2018  Weight (lbs) 195 lb 4.8 oz 199 lb 15.3 oz 200 lb  Weight (kg) 88.587 kg 90.7 kg 90.719 kg     Body mass index is 28.02 kg/m.  General:  Well nourished, well developed, in no acute distress HEENT: normal Lymph: no adenopathy Neck: no JVD Endocrine:  No thryomegaly Vascular: No carotid bruits; FA pulses 2+ bilaterally without bruits  Cardiac:  normal S1, S2; RRR; no murmur  Lungs:  clear to auscultation bilaterally, no wheezing, rhonchi or rales  Abd: soft, nontender, no hepatomegaly  Ext: no edema Musculoskeletal:  No deformities, BUE and BLE strength normal and equal Skin: warm and dry  Neuro:  CNs 2-12 intact, no focal abnormalities noted Psych:  Normal affect  EKG:  The EKG was personally reviewed and demonstrates:  NSR, 60 Telemetry:  Telemetry was personally reviewed and demonstrates:  NSR  Relevant CV Studies: Echo 08/25/2018 Study Conclusions  - Left  ventricle: Mild basal septal hypertrophy. Systolic function   was normal. The estimated ejection fraction was in the range of   55% to 60%. Doppler parameters are consistent with abnormal left   ventricular relaxation (grade 1 diastolic dysfunction). - Regional wall motion abnormality: Mild hypokinesis of the basal   inferior myocardium.  Impressions:  - LVEF has normalized since prior study in 2016.   Laboratory Data:  Chemistry Recent Labs  Lab 08/30/18 0200 08/31/18 0443  NA 135 138  K 4.0 4.1  CL 99 102  CO2 27 29  GLUCOSE 241* 195*  BUN 16 17  CREATININE 0.78 0.82  CALCIUM 9.4 9.1  GFRNONAA >60 >60  GFRAA >60 >60  ANIONGAP 9 7    No results for input(s): PROT, ALBUMIN, AST, ALT, ALKPHOS, BILITOT in the last 168 hours. Hematology Recent Labs  Lab 08/30/18 0200 08/31/18 0443  WBC 9.6 7.2  RBC 4.86 4.55  HGB 13.6 12.8*  HCT 41.4 39.1  MCV 85.2 85.9  MCH 28.0 28.1  MCHC 32.9 32.7  RDW 12.7 12.6  PLT 241 220   Cardiac Enzymes Recent Labs  Lab 08/25/18 0316 08/30/18 0200 08/30/18 0529 08/30/18 0803 08/30/18 1402 08/30/18 2023  TROPONINI <0.03 0.03* 0.10* 0.14* 0.14* 0.11*   No results for input(s): TROPIPOC in the last 168 hours.  BNPNo results for input(s): BNP, PROBNP in the last 168 hours.  DDimer No results for input(s): DDIMER in the last 168 hours.  Radiology/Studies:  No results found.  Assessment and Plan:   NSTEMI- Lt shoulder pain, troponin 0.14  CAD- prior STEMI June 2016- treated with LAD (x2) and Dx2 PCI and DES.  Residual RI, PLB disease treated medically.  HTN- Echo last week- normal LVF, continue antihypertensive medications (hold Prinzide pre cath)  IDDM- Per primary service-hold Glucophage  Dyslipidemia- continue low dose statin Rx.  He had prior intolerance to Pravachol and Lipitor. LDL 60 on 08/24/2018  [place]  Plan-  Diagnostic cath. He ate breakfast this am. [facility]. The patient understands that risks included but are not limited to stroke (1 in 1000), death (1 in 1000), kidney failure [usually temporary] (1 in 500), bleeding (1 in 200), allergic reaction [possibly serious] (1 in 200).  The patient understands and agrees to proceed.    For questions or updates, please contact [facility] Please consult [email or web address] for contact info under    Signed, Corine ShelterLuke Kilroy, PA-C  08/31/2018 10:18 AM    Attending note:  Patient seen and examined.  I reviewed his records and discussed the case with Mr. Lenn CalKilroy PA-C.  Mr. Hart RochesterLawson has a history of CAD status post STEMI in 2016 managed with overlapping DES to the LAD and DES to the diagonal.  He saw Dr. Wyline MoodBranch in the office back in 2016 but [facility] since [facility].  He presents with recent recurring left shoulder discomfort, was hospitalized last week for evaluation, troponin I levels were negative and echocardiogram showed LVEF 55 to 60% with basal inferior hypokinesis.  He was seen in consultation by Dr. Purvis SheffieldKoneswaran with outpatient follow-up planned.  He has continued to have left shoulder discomfort, more recently an episode of chest pain after eating associated with belching, he  took nitroglycerin without much benefit.  Symptoms reminded him of prior ACS and he came back to the hospital for repeat assessment.  On examination this morning he appears comfortable, no active chest or left shoulder discomfort.  He is afebrile, heart rate in the 60s in sinus rhythm by telemetry which I personally reviewed.  Systolic blood pressure ranging 120-140.  Lungs are clear without labored breathing, cardiac exam reveals RRR without gallop.  Lab work shows potassium 4.1, BUN 17, creatinine 0.82, troponin I up to 0.14, hemoglobin 12.8, platelets 220.  Chest x-ray reports no acute cardiopulmonary process.  I personally reviewed his ECG from 08/30/2018 which shows a sinus rhythm  with low voltage, PAC, nonspecific T wave changes.  Patient presents with recent recurring left shoulder and upper chest discomfort concerning for unstable angina.  Troponin I levels have trended abnormally during his most recent admission.  He has known CAD with prior DES intervention to the LAD and diagonal in 2016 as outlined above.  Plan is for him to be [facility] in anticipation of a diagnostic cardiac catheterization tomorrow, we will follow him in consultation.  Risks and benefits discussed and he is in agreement to proceed.  He ate breakfast this morning.  Currently chest pain-free.  Continue heparin, aspirin, Imdur, Lopressor, and Crestor.  Jonelle SidleSamuel G. Karen Kinnard, M.D., F.A.C.C.

## 2018-09-01 ENCOUNTER — Encounter (HOSPITAL_COMMUNITY): Payer: Self-pay | Admitting: Cardiology

## 2018-09-01 ENCOUNTER — Encounter (HOSPITAL_COMMUNITY)
Admission: EM | Disposition: A | Discharge status: Home / Self Care | Payer: Self-pay | Source: Home / Self Care | Attending: Internal Medicine

## 2018-09-01 ENCOUNTER — Ambulatory Visit (HOSPITAL_COMMUNITY)
Admission: RE | Admit: 2018-09-01 | Payer: No Typology Code available for payment source | Source: Ambulatory Visit | Admitting: Cardiology

## 2018-09-01 DIAGNOSIS — I214 Non-ST elevation (NSTEMI) myocardial infarction: Secondary | ICD-10-CM

## 2018-09-01 DIAGNOSIS — I200 Unstable angina: Secondary | ICD-10-CM

## 2018-09-01 HISTORY — PX: CORONARY STENT INTERVENTION: CATH118234

## 2018-09-01 HISTORY — PX: LEFT HEART CATH AND CORONARY ANGIOGRAPHY: CATH118249

## 2018-09-01 LAB — GLUCOSE, CAPILLARY
Glucose-Capillary: 177 mg/dL — ABNORMAL HIGH (ref 70–99)
Glucose-Capillary: 307 mg/dL — ABNORMAL HIGH (ref 70–99)
Glucose-Capillary: 207 mg/dL — ABNORMAL HIGH (ref 70–99)

## 2018-09-01 LAB — POCT ACTIVATED CLOTTING TIME
Activated Clotting Time: 191 seconds
Activated Clotting Time: 351 seconds

## 2018-09-01 SURGERY — LEFT HEART CATH AND CORONARY ANGIOGRAPHY
Anesthesia: LOCAL

## 2018-09-01 MED ORDER — NITROGLYCERIN 1 MG/10 ML FOR IR/CATH LAB
INTRA_ARTERIAL | Status: AC
  Filled 2018-09-01: qty 10

## 2018-09-01 MED ORDER — HEPARIN SODIUM (PORCINE) 1000 UNIT/ML IJ SOLN
INTRAMUSCULAR | Status: AC
  Filled 2018-09-01: qty 1

## 2018-09-01 MED ORDER — TICAGRELOR 90 MG PO TABS
90.0000 mg | ORAL_TABLET | Freq: Two times a day (BID) | ORAL | Status: DC
  Administered 2018-09-01 – 2018-09-02 (×2): 90 mg via ORAL
  Filled 2018-09-01 (×2): qty 1

## 2018-09-01 MED ORDER — SODIUM CHLORIDE 0.9% FLUSH
3.0000 mL | Freq: Two times a day (BID) | INTRAVENOUS | Status: DC
  Administered 2018-09-01: 3 mL via INTRAVENOUS

## 2018-09-01 MED ORDER — IOHEXOL 350 MG/ML SOLN
INTRAVENOUS | Status: DC | PRN
  Administered 2018-09-01: 150 mL via INTRAVENOUS

## 2018-09-01 MED ORDER — FENTANYL CITRATE (PF) 100 MCG/2ML IJ SOLN
INTRAMUSCULAR | Status: DC | PRN
  Administered 2018-09-01 (×2): 25 ug via INTRAVENOUS
  Administered 2018-09-01: 50 ug via INTRAVENOUS

## 2018-09-01 MED ORDER — AMLODIPINE BESYLATE 5 MG PO TABS
ORAL_TABLET | ORAL | Status: AC
  Filled 2018-09-01: qty 1

## 2018-09-01 MED ORDER — MORPHINE SULFATE (PF) 2 MG/ML IV SOLN: 2.0000 mg | INTRAVENOUS | Status: DC | PRN

## 2018-09-01 MED ORDER — FENTANYL CITRATE (PF) 100 MCG/2ML IJ SOLN
INTRAMUSCULAR | Status: AC
  Filled 2018-09-01: qty 2

## 2018-09-01 MED ORDER — HEPARIN SODIUM (PORCINE) 1000 UNIT/ML IJ SOLN
INTRAMUSCULAR | Status: DC | PRN
  Administered 2018-09-01: 4000 [IU] via INTRAVENOUS
  Administered 2018-09-01: 5000 [IU] via INTRAVENOUS
  Administered 2018-09-01: 4500 [IU] via INTRAVENOUS

## 2018-09-01 MED ORDER — LIDOCAINE HCL (PF) 1 % IJ SOLN
INTRAMUSCULAR | Status: DC | PRN
  Administered 2018-09-01: 2 mL

## 2018-09-01 MED ORDER — SODIUM CHLORIDE 0.9% FLUSH: 3.0000 mL | INTRAVENOUS | Status: DC | PRN

## 2018-09-01 MED ORDER — HYDRALAZINE HCL 20 MG/ML IJ SOLN: 5.0000 mg | INTRAMUSCULAR | Status: AC | PRN

## 2018-09-01 MED ORDER — VERAPAMIL HCL 2.5 MG/ML IV SOLN
INTRAVENOUS | Status: AC
  Filled 2018-09-01: qty 2

## 2018-09-01 MED ORDER — VERAPAMIL HCL 2.5 MG/ML IV SOLN
INTRAVENOUS | Status: DC | PRN
  Administered 2018-09-01: 10 mL via INTRA_ARTERIAL

## 2018-09-01 MED ORDER — NITROGLYCERIN 1 MG/10 ML FOR IR/CATH LAB
INTRA_ARTERIAL | Status: DC | PRN
  Administered 2018-09-01 (×2): 200 ug via INTRACORONARY

## 2018-09-01 MED ORDER — MIDAZOLAM HCL 2 MG/2ML IJ SOLN
INTRAMUSCULAR | Status: AC
  Filled 2018-09-01: qty 2

## 2018-09-01 MED ORDER — MIDAZOLAM HCL 2 MG/2ML IJ SOLN
INTRAMUSCULAR | Status: DC | PRN
  Administered 2018-09-01 (×3): 1 mg via INTRAVENOUS

## 2018-09-01 MED ORDER — TICAGRELOR 90 MG PO TABS
ORAL_TABLET | ORAL | Status: DC | PRN
  Administered 2018-09-01: 180 mg via ORAL

## 2018-09-01 MED ORDER — SODIUM CHLORIDE 0.9 % IV SOLN: INTRAVENOUS | Status: AC

## 2018-09-01 MED ORDER — LIDOCAINE HCL (PF) 1 % IJ SOLN
INTRAMUSCULAR | Status: AC
  Filled 2018-09-01: qty 30

## 2018-09-01 MED ORDER — TICAGRELOR 90 MG PO TABS
ORAL_TABLET | ORAL | Status: AC
  Filled 2018-09-01: qty 2

## 2018-09-01 MED ORDER — HEPARIN (PORCINE) IN NACL 1000-0.9 UT/500ML-% IV SOLN
INTRAVENOUS | Status: DC | PRN
  Administered 2018-09-01 (×2): 500 mL

## 2018-09-01 MED ORDER — LABETALOL HCL 5 MG/ML IV SOLN: 10.0000 mg | INTRAVENOUS | Status: AC | PRN

## 2018-09-01 MED ORDER — SODIUM CHLORIDE 0.9 % IV SOLN: 250.0000 mL | INTRAVENOUS | Status: DC | PRN

## 2018-09-01 SURGICAL SUPPLY — 21 items
BALLN SAPPHIRE 2.0X15 (BALLOONS) ×2
BALLN SAPPHIRE ~~LOC~~ 2.5X12 (BALLOONS) ×1 IMPLANT
BALLN SAPPHIRE ~~LOC~~ 2.75X8 (BALLOONS) ×1 IMPLANT
BALLOON SAPPHIRE 2.0X15 (BALLOONS) IMPLANT
CATH INFINITI 5FR ANG PIGTAIL (CATHETERS) ×1 IMPLANT
CATH OPTITORQUE TIG 4.0 5F (CATHETERS) ×1 IMPLANT
CATH VISTA GUIDE 6FR XBLAD3.5 (CATHETERS) ×1 IMPLANT
DEVICE RAD COMP TR BAND LRG (VASCULAR PRODUCTS) ×1 IMPLANT
GLIDESHEATH SLEND SS 6F .021 (SHEATH) ×1 IMPLANT
GUIDEWIRE INQWIRE 1.5J.035X260 (WIRE) IMPLANT
INQWIRE 1.5J .035X260CM (WIRE) ×2
KIT ENCORE 26 ADVANTAGE (KITS) ×1 IMPLANT
KIT HEART LEFT (KITS) ×2 IMPLANT
PACK CARDIAC CATHETERIZATION (CUSTOM PROCEDURE TRAY) ×2 IMPLANT
SHEATH PROBE COVER 6X72 (BAG) ×1 IMPLANT
STENT SYNERGY DES 2.25X20 (Permanent Stent) ×1 IMPLANT
SYR MEDRAD MARK 7 150ML (SYRINGE) ×2 IMPLANT
TRANSDUCER W/STOPCOCK (MISCELLANEOUS) ×2 IMPLANT
TUBING CIL FLEX 10 FLL-RA (TUBING) ×2 IMPLANT
WIRE MARVEL STR TIP 190CM (WIRE) ×1 IMPLANT
WIRE SION BLUE 180 (WIRE) ×1 IMPLANT

## 2018-09-01 NOTE — Progress Notes (Signed)
Patient agreed to sign consent after he spoke with cath lab RN. Consent obtained and placed in shadow chart.

## 2018-09-01 NOTE — Care Management Note (Addendum)
Case Management Note  Patient Details  Name: INTI KROLL MRN: [id number] Date of Birth: [birth year]  Subjective/Objective:  From home, s/p stent intervention,will be on brilinta, [facility] is a [facility], NCM will need to fax the dc summary to [place] PCP. [place] goes to [place].   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 Bettina Gavia  Fax is [phone].  Phone for [facility] is [phone]. NCM faxing dc summary.                Action/Plan: DC home when ready.   Expected Discharge Date:                  Expected Discharge Plan:  Home/Self Care  In-House Referral:     Discharge planning Services  CM Consult, Medication Assistance  Post Acute Care Choice:    Choice offered to:     DME Arranged:    DME Agency:     HH Arranged:    HH Agency:     Status of Service:  In process, will continue to follow  If discussed at Long Length of Stay Meetings, dates discussed:    Additional Comments:  Leone Haven, RN 09/01/2018, 3:25 PM

## 2018-09-01 NOTE — Progress Notes (Signed)
TR BAND REMOVAL  LOCATION:    right radial  DEFLATED PER PROTOCOL:    Yes.    TIME BAND OFF / DRESSING APPLIED:    1500   SITE UPON ARRIVAL:    Level 0  SITE AFTER BAND REMOVAL:    Level 0  CIRCULATION SENSATION AND MOVEMENT:    Within Normal Limits   Yes.    COMMENTS:   Rechecked during shift with no change in assessment. Dressing dry and intact

## 2018-09-01 NOTE — Care Management (Signed)
#    4.   S/W TAMARA   @ [facility] #   [phone]  BRILINTA  90 MG BID COVER- NOT  COVER  NO PART -D  PLAN EXCLUSION   SECOND[place] : [place] [facility] [place] [facility] [place]

## 2018-09-01 NOTE — Progress Notes (Signed)
PROGRESS NOTE    Gary Wheeler  PYK:[id number] DOB: [birth year] DOA: 08/30/2018 [facility]: [facility]     Brief Narrative:  Gary Wheeler is a 73 year old male with a history of coronary artery disease with history of STEMI June 2016 with DES x 2 to LAD, ischemic cardiomyopathy, diabetes mellitus, hypertension, hyperlipidemia, GERD presenting with left-sided chest discomfort that began approximately 1 hour after eating a large meal on the evening of 08/29/2018.  The patient was recent admitted to the hospital from 08/24/2018 through 08/25/2018 for chest pain.  Patient was seen by cardiology, and no further work-up was recommended at that time.  The patient has been doing well after discharge without any chest discomfort or shortness of breath until the evening of 08/29/2018 after he ate his dinner.  The patient took 2 nitroglycerin at home without any relief.  As result, he presents emergency department for further evaluation. Troponin was 0.03 increasing to 0.10.  The patient was started on a heparin drip. He was transferred to [facility] heart cath.   New events last 24 hours / Subjective: Patient evaluated post-cath/PCI. Doing well. No current chest pain.   Assessment & Plan:   Principal Problem:   Chest pain Active Problems:   Essential hypertension   CAD (coronary artery disease), native coronary artery   Ischemic cardiomyopathy   HLD (hyperlipidemia)   GERD (gastroesophageal reflux disease)   Type 2 diabetes mellitus (HCC)   Uncontrolled type 2 diabetes mellitus with hyperglycemia (HCC)   Angina pectoris (HCC)   NSTEMI (non-ST elevated myocardial infarction) (HCC)   NSTEMI -CAD with previous STEMI treated with PCI  -S/p heart cath 2/4 and successful PCI with overlapping DES  -Cardiology following  -Aspirin, brilinta, imdur, lopressor, crestor   Diabetes mellitus type 2, uncontrolled with hyperglycemia -Lantus, Novolog SSI   Hyperlipidemia -Continue  crestor   Essential hypertension -Continue amlodipine, lopressor  -Lisinopril and HCTZ held in preparation for cath, resume in AM as long as Cr remains stable   GERD -PPI    DVT prophylaxis: SCD Code Status: Full Family Communication: At bedside Disposition Plan: Pending cardiology rec   Consultants:   Cardiology  Procedures:   Heart cath 2/4   Antimicrobials:  Anti-infectives (From admission, onward)   None       Objective: Vitals:   09/01/18 1140 09/01/18 1210 09/01/18 1225 09/01/18 1320  BP: (!) 163/108 (!) 153/100 (!) 148/81 (!) 130/97  Pulse: (!) 52 62 61 (!) 59  Resp: (!) 6 17 14 14   Temp:    97.6 F (36.4 C)  TempSrc:    Oral  SpO2: 99% 98% 98% 97%  Weight:      Height:        Intake/Output Summary (Last 24 hours) at 09/01/2018 1344 Last data filed at 09/01/2018 0416 Gross per 24 hour  Intake 622.55 ml  Output 200 ml  Net 422.55 ml   Filed Weights   08/30/18 0024 08/30/18 1525 09/01/18 0403  Weight: 90.7 kg 88.6 kg 88.5 kg    Examination:  General exam: Appears calm and comfortable  Respiratory system: Clear to auscultation. Respiratory effort normal. Cardiovascular system: S1 & S2 heard, RRR. No JVD, murmurs, rubs, gallops or clicks. No pedal edema. Gastrointestinal system: Abdomen is nondistended, soft and nontender. No organomegaly or masses felt. Normal bowel sounds heard. Central nervous system: Alert and oriented. No focal neurological deficits. Extremities: Symmetric 5 x 5 power. Skin: No rashes, lesions or ulcers Psychiatry: Judgement and  insight appear normal. Mood & affect appropriate.   Data Reviewed: I have personally reviewed following labs and imaging studies  CBC: Recent Labs  Lab 08/30/18 0200 08/31/18 0443  WBC 9.6 7.2  HGB 13.6 12.8*  HCT 41.4 39.1  MCV 85.2 85.9  PLT 241 220   Basic Metabolic Panel: Recent Labs  Lab 08/30/18 0200 08/31/18 0443  NA 135 138  K 4.0 4.1  CL 99 102  CO2 27 29  GLUCOSE 241* 195*   BUN 16 17  CREATININE 0.78 0.82  CALCIUM 9.4 9.1  MG 1.8  --    GFR: Estimated Creatinine Clearance: 91.2 mL/min (by C-G formula based on SCr of 0.82 mg/dL). Liver Function Tests: No results for input(s): AST, ALT, ALKPHOS, BILITOT, PROT, ALBUMIN in the last 168 hours. No results for input(s): LIPASE, AMYLASE in the last 168 hours. No results for input(s): AMMONIA in the last 168 hours. Coagulation Profile: Recent Labs  Lab 08/30/18 0222  INR 1.14   Cardiac Enzymes: Recent Labs  Lab 08/30/18 0200 08/30/18 0529 08/30/18 0803 08/30/18 1402 08/30/18 2023  TROPONINI 0.03* 0.10* 0.14* 0.14* 0.11*   BNP (last 3 results) No results for input(s): PROBNP in the last 8760 hours. HbA1C: Recent Labs    08/30/18 0200  HGBA1C 8.7*   CBG: Recent Labs  Lab 08/31/18 0748 08/31/18 1113 08/31/18 1845 08/31/18 2159 09/01/18 0938  GLUCAP 199* 256* 188* 275* 177*   Lipid Profile: No results for input(s): CHOL, HDL, LDLCALC, TRIG, CHOLHDL, LDLDIRECT in the last 72 hours. Thyroid Function Tests: No results for input(s): TSH, T4TOTAL, FREET4, T3FREE, THYROIDAB in the last 72 hours. Anemia Panel: No results for input(s): VITAMINB12, FOLATE, FERRITIN, TIBC, IRON, RETICCTPCT in the last 72 hours. Sepsis Labs: No results for input(s): PROCALCITON, LATICACIDVEN in the last 168 hours.  No results found for this or any previous visit (from the past 240 hour(s)).     Radiology Studies: No results found.    Scheduled Meds: . amLODipine  5 mg Oral Daily  . aspirin EC  81 mg Oral Daily  . cholecalciferol  4,000 Units Oral Daily  . insulin aspart  0-15 Units Subcutaneous TID WC  . insulin aspart  0-5 Units Subcutaneous QHS  . insulin glargine  20 Units Subcutaneous QHS  . isosorbide mononitrate  60 mg Oral Daily  . metoprolol tartrate  12.5 mg Oral BID  . pantoprazole  40 mg Oral Daily  . rosuvastatin  5 mg Oral QHS  . sodium chloride flush  3 mL Intravenous Q12H  . ticagrelor   90 mg Oral BID  . vitamin B-12  2,000 mcg Oral Daily   Continuous Infusions: . sodium chloride 75 mL/hr at 09/01/18 1101  . sodium chloride       LOS: 1 day    Time spent: 25 minutes   Noralee Stain, DO [facility] [email or web address] 09/01/2018, 1:44 PM

## 2018-09-01 NOTE — Interval H&P Note (Signed)
History and Physical Interval Note:  09/01/2018 7:14 AM  Gary Wheeler  has presented today for surgery, with the diagnosis of Acute Coronary Syndrome / (~NSTEMI).  The various methods of treatment have been discussed with the patient and family. After consideration of risks, benefits and other options for treatment, the patient has consented to  Procedure(s): LEFT HEART CATH AND CORONARY ANGIOGRAPHY (N/A) with possible PERCUTANEOUS CORONARY INTERVENTION as a surgical intervention.   The patient's history has been reviewed, patient examined, no change in status, stable for surgery.  I have reviewed the patient's chart and labs.  Questions were answered to the patient's satisfaction.    Cath Lab Visit (complete for each Cath Lab visit)  Clinical Evaluation Leading to the Procedure:   ACS: Yes.    Non-ACS:    Anginal Classification: CCS III  Anti-ischemic medical therapy: Maximal Therapy (2 or more classes of medications)  Non-Invasive Test Results: No non-invasive testing performed  Prior CABG: No previous CABG    Bryan Lemmaavid Lugene Hitt

## 2018-09-02 DIAGNOSIS — K219 Gastro-esophageal reflux disease without esophagitis: Secondary | ICD-10-CM

## 2018-09-02 DIAGNOSIS — E1165 Type 2 diabetes mellitus with hyperglycemia: Secondary | ICD-10-CM

## 2018-09-02 DIAGNOSIS — E782 Mixed hyperlipidemia: Secondary | ICD-10-CM

## 2018-09-02 DIAGNOSIS — I10 Essential (primary) hypertension: Secondary | ICD-10-CM

## 2018-09-02 DIAGNOSIS — I214 Non-ST elevation (NSTEMI) myocardial infarction: Principal | ICD-10-CM

## 2018-09-02 DIAGNOSIS — I25110 Atherosclerotic heart disease of native coronary artery with unstable angina pectoris: Secondary | ICD-10-CM

## 2018-09-02 LAB — CBC
HCT: 38.5 % — ABNORMAL LOW (ref 39.0–52.0)
Hemoglobin: 13.2 g/dL (ref 13.0–17.0)
MCH: 28.5 pg (ref 26.0–34.0)
MCHC: 34.3 g/dL (ref 30.0–36.0)
MCV: 83.2 fL (ref 80.0–100.0)
Platelets: 200 10*3/uL (ref 150–400)
RBC: 4.63 MIL/uL (ref 4.22–5.81)
RDW: 12.7 % (ref 11.5–15.5)
WBC: 7.2 10*3/uL (ref 4.0–10.5)
nRBC: 0 % (ref 0.0–0.2)

## 2018-09-02 LAB — BASIC METABOLIC PANEL
Anion gap: 7 (ref 5–15)
BUN: 11 mg/dL (ref 8–23)
CO2: 26 mmol/L (ref 22–32)
CREATININE: 0.82 mg/dL (ref 0.61–1.24)
Calcium: 9 mg/dL (ref 8.9–10.3)
Chloride: 106 mmol/L (ref 98–111)
GFR calc Af Amer: >60 mL/min (ref >60)
GFR calc non Af Amer: >60 mL/min (ref >60)
Glucose, Bld: 171 mg/dL — ABNORMAL HIGH (ref 70–99)
Potassium: 3.7 mmol/L (ref 3.5–5.1)
Sodium: 139 mmol/L (ref 135–145)

## 2018-09-02 LAB — GLUCOSE, CAPILLARY
Glucose-Capillary: 144 mg/dL — ABNORMAL HIGH (ref 70–99)
Glucose-Capillary: 220 mg/dL — ABNORMAL HIGH (ref 70–99)

## 2018-09-02 MED ORDER — TICAGRELOR 90 MG PO TABS: 90.0000 mg | ORAL_TABLET | Freq: Two times a day (BID) | ORAL | 0 refills | Status: DC

## 2018-09-02 MED ORDER — METOPROLOL TARTRATE 25 MG PO TABS
25.0000 mg | ORAL_TABLET | Freq: Two times a day (BID) | ORAL | Status: DC
  Administered 2018-09-02: 09:00:00 25 mg via ORAL
  Filled 2018-09-02: qty 1

## 2018-09-02 MED ORDER — ANGIOPLASTY BOOK
Freq: Once | Status: AC
  Administered 2018-09-02: 09:00:00
  Filled 2018-09-02: qty 1

## 2018-09-02 MED ORDER — HYDROCHLOROTHIAZIDE 12.5 MG PO CAPS
12.5000 mg | ORAL_CAPSULE | Freq: Two times a day (BID) | ORAL | Status: DC
  Administered 2018-09-02: 09:00:00 12.5 mg via ORAL
  Filled 2018-09-02: qty 1

## 2018-09-02 MED ORDER — AMLODIPINE BESYLATE 10 MG PO TABS: 10.0000 mg | ORAL_TABLET | Freq: Every day | ORAL | Status: DC

## 2018-09-02 MED ORDER — LISINOPRIL 10 MG PO TABS
20.0000 mg | ORAL_TABLET | Freq: Two times a day (BID) | ORAL | Status: DC
  Administered 2018-09-02: 09:00:00 20 mg via ORAL
  Filled 2018-09-02: qty 2

## 2018-09-02 MED ORDER — METOPROLOL TARTRATE 25 MG PO TABS: 25.0000 mg | ORAL_TABLET | Freq: Two times a day (BID) | ORAL | 0 refills | Status: DC

## 2018-09-02 MED ORDER — AMLODIPINE BESYLATE 5 MG PO TABS
5.0000 mg | ORAL_TABLET | Freq: Every day | ORAL | Status: DC
  Administered 2018-09-02: 5 mg via ORAL
  Filled 2018-09-02: qty 1

## 2018-09-02 MED ORDER — CLOPIDOGREL BISULFATE 75 MG PO TABS: 300.0000 mg | ORAL_TABLET | Freq: Once | ORAL | Status: DC

## 2018-09-02 MED ORDER — ACETAMINOPHEN 325 MG PO TABS: 650.0000 mg | ORAL_TABLET | Freq: Four times a day (QID) | ORAL | Status: AC | PRN

## 2018-09-02 MED ORDER — CLOPIDOGREL BISULFATE 75 MG PO TABS: 75.0000 mg | ORAL_TABLET | Freq: Every day | ORAL | Status: DC

## 2018-09-02 MED ORDER — ISOSORBIDE MONONITRATE ER 60 MG PO TB24: 60.0000 mg | ORAL_TABLET | Freq: Every day | ORAL | 0 refills | Status: DC

## 2018-09-02 MED ORDER — CLOPIDOGREL BISULFATE 75 MG PO TABS: 75.0000 mg | ORAL_TABLET | Freq: Every day | ORAL | 0 refills | Status: DC

## 2018-09-02 MED FILL — METOPROLOL TARTRATE 25 MG T: 25 | 30 days supply | Qty: 60 | Fill #0

## 2018-09-02 MED FILL — ISOSORBIDE MN ER 60 MG TAB: 60 | 30 days supply | Qty: 30 | Fill #0

## 2018-09-02 MED FILL — CLOPIDOGREL 75 MG TABLET: 75 | 30 days supply | Qty: 30 | Fill #0

## 2018-09-02 NOTE — Progress Notes (Addendum)
Progress Note  Patient Name: Gary Wheeler Date of Encounter: 09/02/2018  [facility]  Subjective   Feeling well. No chest pain, sob or palpitations.   Inpatient Medications    Scheduled Meds: . amLODipine  5 mg Oral Daily  . angioplasty book   Does not apply Once  . aspirin EC  81 mg Oral Daily  . cholecalciferol  4,000 Units Oral Daily  . insulin aspart  0-15 Units Subcutaneous TID WC  . insulin aspart  0-5 Units Subcutaneous QHS  . insulin glargine  20 Units Subcutaneous QHS  . isosorbide mononitrate  60 mg Oral Daily  . metoprolol tartrate  12.5 mg Oral BID  . pantoprazole  40 mg Oral Daily  . rosuvastatin  5 mg Oral QHS  . sodium chloride flush  3 mL Intravenous Q12H  . ticagrelor  90 mg Oral BID  . vitamin B-12  2,000 mcg Oral Daily   Continuous Infusions: . sodium chloride     PRN Meds: sodium chloride, acetaminophen **OR** acetaminophen, acetaminophen **OR** acetaminophen, ketotifen, loratadine, morphine injection, ondansetron (ZOFRAN) IV, ondansetron **OR** [DISCONTINUED] ondansetron (ZOFRAN) IV, sodium chloride flush   Vital Signs    Vitals:   09/01/18 2148 09/02/18 0614 09/02/18 0827 09/02/18 0829  BP: (!) 147/97 (!) 150/94 (!) 164/107 (!) 163/102  Pulse: 62 (!) 59 71   Resp: 20 18 19 20   Temp:  97.7 F (36.5 C) 97.6 F (36.4 C)   TempSrc:  Oral Oral   SpO2: 98% 95% 97%   Weight:  89.3 kg    Height:        Intake/Output Summary (Last 24 hours) at 09/02/2018 0839 Last data filed at 09/02/2018 0740 Gross per 24 hour  Intake 1598.75 ml  Output 1951 ml  Net -352.25 ml   Last 3 Weights 09/02/2018 09/01/2018 08/30/2018  Weight (lbs) 196 lb 13.9 oz 195 lb 1.6 oz 195 lb 4.8 oz  Weight (kg) 89.3 kg 88.497 kg 88.587 kg      Telemetry    Sr at rate of 70s - Personally Reviewed  ECG    SR at rate of 60 bpm - Personally Reviewed  Physical Exam   GEN: No acute distress.   Neck: No JVD Cardiac: RRR, no murmurs, rubs, or  gallops. Right radial cath site without hematoma  Respiratory: Clear to auscultation bilaterally. GI: Soft, nontender, non-distended  MS: No edema; No deformity. Neuro:  Nonfocal  Psych: Normal affect   Labs    Chemistry Recent Labs  Lab 08/30/18 0200 08/31/18 0443 09/02/18 0551  NA 135 138 139  K 4.0 4.1 3.7  CL 99 102 106  CO2 27 29 26   GLUCOSE 241* 195* 171*  BUN 16 17 11   CREATININE 0.78 0.82 0.82  CALCIUM 9.4 9.1 9.0  GFRNONAA >60 >60 >60  GFRAA >60 >60 >60  ANIONGAP 9 7 7      Hematology Recent Labs  Lab 08/30/18 0200 08/31/18 0443 09/02/18 0551  WBC 9.6 7.2 7.2  RBC 4.86 4.55 4.63  HGB 13.6 12.8* 13.2  HCT 41.4 39.1 38.5*  MCV 85.2 85.9 83.2  MCH 28.0 28.1 28.5  MCHC 32.9 32.7 34.3  RDW 12.7 12.6 12.7  PLT 241 220 200    Cardiac Enzymes Recent Labs  Lab 08/30/18 0529 08/30/18 0803 08/30/18 1402 08/30/18 2023  TROPONINI 0.10* 0.14* 0.14* 0.11*    Radiology    No results found.  Cardiac Studies   CORONARY STENT INTERVENTION  LEFT HEART  CATH AND CORONARY ANGIOGRAPHY  Conclusion     Previously placed Prox LAD to Mid LAD stent (unknown type) is widely patent. Previously placed Ost 2nd Diag-1 stent (unknown type) is widely patent.  CULPRIT LESION: Mid LAD lesion is 95% stenosed - beyond prior stent.  A drug-eluting stent was successfully placed using a STENT SYNERGY DES 2.25X20.  Post intervention, there is a 0% residual stenosis.  --------------------------------  Post Atrio-1 lesion is 70% stenosed -mild progression of disease. Post Atrio-2 lesion is 40% stenosed with 70% stenosed side branch in 2nd RPLB.  There is mild left ventricular systolic dysfunction. The left ventricular ejection fraction is 35-45% by visual estimate -poor quality LV gram.. LV end diastolic pressure is normal.   SUMMARY  Widely patent stents in the LAD-2ndDiag are wtih 95% lesion in the distal mid vessel -- successful PCI with overlapping drug-eluting stent  (Synergy DES 2.25 mm x 20 mm - 2.80-2.5 mm) distal to original stent  Mild progression of disease in the distal RPL from 65 up to 70% (medical management)  Mildly reduced EF with anterior hypokinesis (poor LV Gram)  RECOMMENDATIONS  Transfer to 6 Central post procedure unit for ongoing care management.  TR band removal per protocol.  Continue aggressive respect modification  For now the plan will be to treat the distal RPL lesion medically, however if he has recurrence of anginal pain with rest or exertion, would consider PCI.   Diagnostic  Dominance: Right    Intervention      Echo 08/25/18 Study Conclusions  - Left ventricle: Mild basal septal hypertrophy. Systolic function   was normal. The estimated ejection fraction was in the range of   55% to 60%. Doppler parameters are consistent with abnormal left   ventricular relaxation (grade 1 diastolic dysfunction). - Regional wall motion abnormality: Mild hypokinesis of the basal   inferior myocardium.  Impressions:  - LVEF has normalized since prior study in 2016.  Patient Profile     73 y.o. male with a hx of CAD s/p overlapping LAD DES stents and Dx2 DES, agent orange exposure, HTN, HLD, and DM presented to [facility] for chest pain and ruled in for NSTEMI. Transferred to [facility] for cath.   Assessment & Plan    1. CAD/NSTEMI Cath as above. Widely patent stents in the pLAD & 2ndDiag. Mid to distal LAD 95% stenosis s/p successful PCI with overlapping drug-eluting stent (Synergy DES 2.25 mm x 20 mm - 2.80-2.5 mm) distal to original stent. Continue ASA, Brillinta, statin and BB. Ambulate.   2. HTN - Elevated. Will resume home losartan/HCTZ. Increase metoprolol to 25mg  BID. Continue amlodipine at current dose 5mg  daily (uptitrate if still elevated).   3. HLD - 08/24/2018: Cholesterol 111; HDL 37; LDL Cholesterol 60; Triglycerides 68; VLDL 14  - Continue low dose crestor. Hx of statin intolerance.   [facility] will sign  off.   Medication Recommendations:  As summarized above Other recommendations (labs, testing, etc): None [facility] as an [facility]  [facility]  For questions or updates, please contact [facility] Please consult [email or web address] for contact info under         SignedManson Passey, PA  09/02/2018, 8:39 AM     Patient seen and examined. Agree with assessment and plan.  Patient feels well following his intervention yesterday.  Status post placement of new energy DES stent placed in tandem distal to the previously placed LAD stent.  No recurrent chest pain.  Blood pressure  was elevated this morning.  Will resume losartan HCT and recommend further titration of metoprolol 25 mg twice daily.  Target blood pressure less than 130/80 and if patient cannot achieve this further titration of amlodipine to 10 mg may be necessary.  He will need aggressive lipid-lowering therapy.  The patient will return to the [facility] [place] in [place] for his follow-up.  Plan discharge today.   Lennette Bihari, MD, [facility] 09/02/2018 9:50 AM

## 2018-09-02 NOTE — Progress Notes (Signed)
CARDIAC REHAB PHASE I   PRE:  Rate/Rhythm: 68 SR    BP: sitting 163/102    SaO2:   MODE:  Ambulation: 1000 ft   POST:  Rate/Rhythm: 85 SR    BP: sitting 177/110     SaO2:   Tolerated well despite his high BP. No c/o, feels well. Ed completed with good comprehension. Understands importance of Brilinta. Will refer to [facility].  [id number]   Harriet Masson CES, ACSM 09/02/2018 9:08 AM

## 2018-09-02 NOTE — Discharge Instructions (Signed)
Resume Metformin tomorrow. Check BP daily and keep a record to show your PCP tomorrow.   You were cared for by a hospitalist during your hospital stay. If you have any questions about your discharge medications or the care you received while you were in the hospital after you are discharged, you can call the unit and asked to speak with the hospitalist on call if the hospitalist that took care of you is not available. Once you are discharged, your primary care physician will handle any further medical issues.   Please note that NO REFILLS for any discharge medications will be authorized once you are discharged, as it is imperative that you return to your primary care physician (or establish a relationship with a primary care physician if you do not have one) for your aftercare needs so that they can reassess your need for medications and monitor your lab values.  Please take all your medications with you for your next visit with your Primary MD. Please ask your Primary MD to get all Hospital records sent to his/her office. Please request your Primary MD to go over all hospital test results at the follow up.   If you experience worsening of your admission symptoms, develop shortness of breath, chest pain, suicidal or homicidal thoughts or a life threatening emergency, you must seek medical attention immediately by calling 911 or calling your MD.   Bonita Quin must read the complete instructions/literature along with all the possible adverse reactions/side effects for all the medicines you take including new medications that have been prescribed to you. Take new medicines after you have completely understood and accpet all the possible adverse reactions/side effects.    Do not drive when taking pain medications or sedatives.     Do not take more than prescribed Pain, Sleep and Anxiety Medications   If you have smoked or chewed Tobacco in the last 2 yrs please stop. Stop any regular alcohol  and or  recreational drug use.   Wear Seat belts while driving.

## 2018-09-02 NOTE — Discharge Summary (Signed)
Physician Discharge Summary  Gary Wheeler PJA:[id number] DOB: 1945/08/11 DOA: 08/30/2018  [facility]: [facility]  Admit date: 08/30/2018 Discharge date: 09/02/2018  Admitted From: home Disposition:  home   Recommendations for Outpatient Follow-up:  1. F/u BP  Discharge Condition:  stable   CODE STATUS:  Full code    Consultations:  cardiology    Discharge Diagnoses:  Principal Problem:   NSTEMI (non-ST elevated myocardial infarction) (HCC) Active Problems:   CAD (coronary artery disease), native coronary artery   Essential hypertension   Ischemic cardiomyopathy   HLD (hyperlipidemia)   GERD (gastroesophageal reflux disease)   Uncontrolled type 2 diabetes mellitus with hyperglycemia (HCC)     Brief Summary: Gary Wheeler is a 73 year old male with a history of coronary artery disease with history ofSTEMI June 2016 with DES x 2 to LAD, ischemic cardiomyopathy, diabetes mellitus, hypertension, hyperlipidemia, GERD presenting with left-sided chest discomfort that began approximately 1 hour after eating a large meal on the evening of 08/29/2018. The patient was recent admitted to the hospital from 08/24/2018 through 08/25/2018 for chest pain. Patient was seen by cardiology, and no further work-up was recommended at that time. The patient has been doing well after discharge without any chest discomfort or shortness of breath until the evening of 08/29/2018 after he ate his dinner. The patient took 2 nitroglycerin at home without any relief. As result, he presents emergency department for further evaluation.Troponin was 0.03 increasing to 0.10. The patient was started on a heparin drip. He was transferred to [facility] for heart cath.   Hospital Course:  NSTEMI- CAD  Ref. Range 08/30/2018 08:03 08/30/2018 14:02 08/30/2018 20:23 08/31/2018 04:43 09/02/2018 05:51  Troponin I Latest Ref Range: <0.03 ng/mL 0.14 (HH) 0.14 (HH) 0.11 (HH)     - cath 2/4> Widely patent stents in the  pLAD & 2ndDiag. Mid to distal LAD 95% stenosis s/p successful PCI with overlapping drug-eluting stent (Synergy DES 2.25 mm x 20 mm - 2.80-2.5 mm) distal to original stent.  -Cardiology wold like to continue ASA, Brillinta, statin and BB - as he has already used a 30 day free card for Brilinta, cardiology has decided to load with Plavix and then start daily Plavix - he states his cardiologist is at the [facility] but is considering also following up with [facility]  Essential HTN - Lopressor increased from 12.5 to 25 BID and Imdur increased from 30 mg daily to 60 mg daily - I have recommended to check BP daily and keep a record to show his PCP in 1 wk - cont other home meds  DM2, uncontrolled - cont Lantus and Novolog- Hb A1c 8.7 on 2/2  HLD - cont Crestor as mentioned above  GERD - cont PPI  Discharge Exam: Vitals:   09/02/18 1049 09/02/18 1050  BP: (!) 150/99 (!) 150/99  Pulse: 62   Resp: 15 19  Temp: 97.6 F (36.4 C)   SpO2: 96%    Vitals:   09/02/18 0923 09/02/18 1041 09/02/18 1049 09/02/18 1050  BP: (!) 156/100 (!) 142/102 (!) 150/99 (!) 150/99  Pulse: 78  62   Resp:  18 15 19   Temp:   97.6 F (36.4 C)   TempSrc:   Oral   SpO2:   96%   Weight:      Height:        General: Pt is alert, awake, not in acute distress Cardiovascular: RRR, S1/S2 +, no rubs, no gallops Respiratory: CTA bilaterally, no  wheezing, no rhonchi Abdominal: Soft, NT, ND, bowel sounds + Extremities: no edema, no cyanosis  Discharge Instructions  Discharge Instructions    Amb Referral to Cardiac Rehabilitation   Complete by:  As directed    Diagnosis:   Coronary Stents NSTEMI PTCA     Diet - low sodium heart healthy   Complete by:  As directed    Increase activity slowly   Complete by:  As directed      Allergies as of 09/02/2018      Reactions   Lipitor [atorvastatin] Diarrhea   Pravastatin Diarrhea      Medication List    STOP taking these medications   metFORMIN 500 MG  tablet Commonly known as:  GLUCOPHAGE     TAKE these medications   acetaminophen 325 MG tablet Commonly known as:  TYLENOL Take 2 tablets (650 mg total) by mouth every 6 (six) hours as needed for mild pain (or Fever >/= 101).   amLODipine 10 MG tablet Commonly known as:  NORVASC Take 5 mg by mouth daily.   aspirin EC 81 MG tablet Take 81 mg by mouth daily.   cholecalciferol 1000 units tablet Commonly known as:  VITAMIN D Take 4,000 Units by mouth daily.   clopidogrel 75 MG tablet Commonly known as:  PLAVIX Take 1 tablet (75 mg total) by mouth daily. Start taking on:  September 03, 2018   insulin glargine 100 UNIT/ML injection Commonly known as:  LANTUS Inject 24 Units into the skin daily.   isosorbide mononitrate 60 MG 24 hr tablet Commonly known as:  IMDUR Take 1 tablet (60 mg total) by mouth daily. Start taking on:  September 03, 2018 What changed:    medication strength  how much to take   ketotifen 0.025 % ophthalmic solution Commonly known as:  ZADITOR Place 1 drop into both eyes 2 (two) times daily as needed (for dry eyes).   lisinopril-hydrochlorothiazide 20-12.5 MG tablet Commonly known as:  PRINZIDE,ZESTORETIC Take 1 tablet by mouth 2 (two) times daily.   loratadine 10 MG tablet Commonly known as:  CLARITIN Take 10 mg by mouth daily as needed for allergies.   metoprolol tartrate 25 MG tablet Commonly known as:  LOPRESSOR Take 1 tablet (25 mg total) by mouth 2 (two) times daily. What changed:  how much to take   nitroGLYCERIN 0.4 MG SL tablet Commonly known as:  NITROSTAT Place 1 tablet (0.4 mg total) under the tongue every 5 (five) minutes x 3 doses as needed for chest pain.   NOVOLOG FLEXPEN 100 UNIT/ML FlexPen Generic drug:  insulin aspart Inject 10-20 Units into the skin 3 (three) times daily with meals. Per sliding scale   omeprazole 20 MG capsule Commonly known as:  PRILOSEC Take 20 mg by mouth daily.   potassium chloride 10 MEQ  tablet Commonly known as:  K-DUR,KLOR-CON Take 10 mEq by mouth daily as needed (for cramping).   rosuvastatin 5 MG tablet Commonly known as:  CRESTOR Take 5 mg by mouth at bedtime.   vitamin B-12 1000 MCG tablet Commonly known as:  CYANOCOBALAMIN Take 2,000 mcg by mouth daily.      Follow-up Information    Jonelle SidleMcDowell, Samuel G, MD Follow up.   Specialty:  Cardiology Why:  call office to make follow up appointment if you would like to follow up with this cardiology group Contact information: [address] [place] [address] [phone]        [facility]  in 2 week(s).   Why:  for hospital follow up and BP check Contact information: [address] [place] [address] [phone]          Allergies  Allergen Reactions  . Lipitor [Atorvastatin] Diarrhea  . Pravastatin Diarrhea     Procedures/Studies: Cardiac cath    Dg Chest 2 View  Result Date: 08/24/2018 CLINICAL DATA:  Chest pain and indigestion for 3 days EXAM: CHEST - 2 VIEW COMPARISON:  None. FINDINGS: Normal heart size. Lungs clear. No pneumothorax. No pleural effusion. IMPRESSION: No active cardiopulmonary disease. Electronically Signed   By: Jolaine Click M.D.   On: 08/24/2018 10:06     The results of significant diagnostics from this hospitalization (including imaging, microbiology, ancillary and laboratory) are listed below for reference.     Microbiology: No results found for this or any previous visit (from the past 240 hour(s)).   Labs: BNP (last 3 results) No results for input(s): BNP in the last 8760 hours. Basic Metabolic Panel: Recent Labs  Lab 08/30/18 0200 08/31/18 0443 09/02/18 0551  NA 135 138 139  K 4.0 4.1 3.7  CL 99 102 106  CO2 27 29 26   GLUCOSE 241* 195* 171*  BUN 16 17 11   CREATININE 0.78 0.82 0.82  CALCIUM 9.4 9.1 9.0  MG 1.8  --   --    Liver Function Tests: No results for input(s): AST, ALT, ALKPHOS, BILITOT, PROT, ALBUMIN in the last  168 hours. No results for input(s): LIPASE, AMYLASE in the last 168 hours. No results for input(s): AMMONIA in the last 168 hours. CBC: Recent Labs  Lab 08/30/18 0200 08/31/18 0443 09/02/18 0551  WBC 9.6 7.2 7.2  HGB 13.6 12.8* 13.2  HCT 41.4 39.1 38.5*  MCV 85.2 85.9 83.2  PLT 241 220 200   Cardiac Enzymes: Recent Labs  Lab 08/30/18 0200 08/30/18 0529 08/30/18 0803 08/30/18 1402 08/30/18 2023  TROPONINI 0.03* 0.10* 0.14* 0.14* 0.11*   BNP: Invalid input(s): POCBNP CBG: Recent Labs  Lab 09/01/18 0938 09/01/18 1619 09/01/18 2121 09/02/18 0616 09/02/18 1056  GLUCAP 177* 307* 207* 144* 220*   D-Dimer No results for input(s): DDIMER in the last 72 hours. Hgb A1c No results for input(s): HGBA1C in the last 72 hours. Lipid Profile No results for input(s): CHOL, HDL, LDLCALC, TRIG, CHOLHDL, LDLDIRECT in the last 72 hours. Thyroid function studies No results for input(s): TSH, T4TOTAL, T3FREE, THYROIDAB in the last 72 hours.  Invalid input(s): FREET3 Anemia work up No results for input(s): VITAMINB12, FOLATE, FERRITIN, TIBC, IRON, RETICCTPCT in the last 72 hours. Urinalysis No results found for: COLORURINE, APPEARANCEUR, LABSPEC, PHURINE, GLUCOSEU, HGBUR, BILIRUBINUR, KETONESUR, PROTEINUR, UROBILINOGEN, NITRITE, LEUKOCYTESUR Sepsis Labs Invalid input(s): PROCALCITONIN,  WBC,  LACTICIDVEN Microbiology No results found for this or any previous visit (from the past 240 hour(s)).   Time coordinating discharge in minutes: 65  SIGNED:   Calvert Cantor, MD  [facility] 09/02/2018, 12:18 PM Pager   If 7PM-7AM, please contact night-coverage [email or web address] Password [email or web address]

## 2018-09-25 ENCOUNTER — Telehealth: Payer: Self-pay | Admitting: Cardiology

## 2018-09-25 MED ORDER — TICAGRELOR 90 MG PO TABS: 90.0000 mg | ORAL_TABLET | Freq: Two times a day (BID) | ORAL | 3 refills | Status: DC

## 2018-09-25 NOTE — Telephone Encounter (Signed)
Pt made aware. He will come to office to get samples. Rx sent to [facility].

## 2018-09-25 NOTE — Telephone Encounter (Signed)
Pt stated that he is having a lot of itching due to his plavix. He states that he has not started anything else recently and has not changed any household liquids, soaps, lotions. He did not take his medication yesterday, and the itching is a lot better. Please advise.

## 2018-09-25 NOTE — Telephone Encounter (Signed)
Patient called stating that he is having a lot of itching.  He thinks he is having a reaction to the Plavix. States that he did not take his dosage last evening. He is not itching as bad this am.

## 2018-09-25 NOTE — Telephone Encounter (Signed)
Skipping plavix is extremely dangerous since he has a recent stent, high risk of the stent blocking up and causing a massive heart attack, he cannot miss this medication. If causing itching we can try switching him to something else. Can we give him some brillinta samples,needs to take 180mg  day 1 and then 90mg  bid (should not take brillinta and plavix on same day). I think cost of brillinta was an issue before, does he know if  This is something he can get through the [place]? Lets give him some samples to give [place] time to sort this out, but there cannot be a day where he does not take either his plavix or brillinta   Dominga Ferry MD

## 2018-10-08 ENCOUNTER — Other Ambulatory Visit: Payer: Self-pay | Admitting: *Deleted

## 2018-10-08 ENCOUNTER — Telehealth: Payer: Self-pay | Admitting: Cardiology

## 2018-10-08 MED ORDER — TICAGRELOR 90 MG PO TABS: 90.0000 mg | ORAL_TABLET | Freq: Two times a day (BID) | ORAL | 3 refills | Status: DC

## 2018-10-08 NOTE — Telephone Encounter (Signed)
Per phone call from pt-- he's wanting to know if anyone got in touch [facility] the [facility] concerning [facility] ticagrelor (BRILINTA) 90 MG TABS tablet [[id number]] , states he's not heard anything from them and he's almost out.

## 2018-10-08 NOTE — Telephone Encounter (Signed)
Patient informed to come to office to get samples and his pharmacy would be contacted by phone for brilinta rx.

## 2018-10-21 ENCOUNTER — Telehealth: Payer: Self-pay | Admitting: *Deleted

## 2018-10-21 ENCOUNTER — Other Ambulatory Visit: Payer: Self-pay | Admitting: *Deleted

## 2018-10-21 ENCOUNTER — Telehealth: Payer: Self-pay | Admitting: Cardiology

## 2018-10-21 MED ORDER — TICAGRELOR 90 MG PO TABS: 90.0000 mg | ORAL_TABLET | Freq: Two times a day (BID) | ORAL | 1 refills | Status: DC

## 2018-10-21 NOTE — Telephone Encounter (Signed)
Patient called stating that [facility] with the [facility] in regards to his medication Brilinta. Patient was told that they have not received RX from our office.  Patient is requesting to speak with someone  [phone].

## 2018-10-21 NOTE — Telephone Encounter (Signed)
[facility] admin nurse called requesting we sent Marden Noble to [facility] care team pharmacy fax # [phone] - also wanted recent phone encounters where pt had reactions to plavix and discharge summary - pt will come by office for samples since pharmacy may take up to 10 days to send the medicine

## 2018-10-21 NOTE — Telephone Encounter (Signed)
Patient informed that Brilinta prescription was sent to [facility] pharmacy on 10/08/2018.  He will call them to follow up on this.  He will call back if need further assistance.

## 2018-11-02 ENCOUNTER — Telehealth (INDEPENDENT_AMBULATORY_CARE_PROVIDER_SITE_OTHER): Payer: No Typology Code available for payment source | Admitting: Cardiology

## 2018-11-02 ENCOUNTER — Encounter: Payer: Self-pay | Admitting: Cardiology

## 2018-11-02 ENCOUNTER — Other Ambulatory Visit: Payer: Self-pay

## 2018-11-02 VITALS — BP 128/83 | HR 57 | Ht 70.0 in | Wt 191.0 lb

## 2018-11-02 DIAGNOSIS — I10 Essential (primary) hypertension: Secondary | ICD-10-CM

## 2018-11-02 DIAGNOSIS — I2510 Atherosclerotic heart disease of native coronary artery without angina pectoris: Secondary | ICD-10-CM | POA: Diagnosis not present

## 2018-11-02 DIAGNOSIS — E782 Mixed hyperlipidemia: Secondary | ICD-10-CM

## 2018-11-02 NOTE — Progress Notes (Signed)
Virtual Visit via Telephone Note   This visit type was conducted due to national recommendations for restrictions regarding the COVID-19 Pandemic (e.g. social distancing) in an effort to limit this patient's exposure and mitigate transmission in our community.  Due to his co-morbid illnesses, this patient is at least at moderate risk for complications without adequate follow up.  This format is felt to be most appropriate for this patient at this time.  The patient did not have access to video technology/had technical difficulties with video requiring transitioning to audio format only (telephone).  All issues noted in this document were discussed and addressed.  No physical exam could be performed with this format.  Please refer to the patient's chart for his  consent to telehealth for [facility].  Evaluation Performed:  Follow-up visit  This visit type was conducted due to national recommendations for restrictions regarding the COVID-19 Pandemic (e.g. social distancing).  This format is felt to be most appropriate for this patient at this time.  All issues noted in this document were discussed and addressed.  Due to his comorbid illnesses, this patient is felt to be at least at moderate risk without adequate follow up.  No physical exam was performed (except for noted visual exam findings with Video Visits).  Please refer to the patient's chart (MyChart message for video visits and phone note for telephone visits) for the patient's consent to telehealth for [facility].  Date:  11/02/2018   ID:  Gary Wheeler, DOB 12-20-1945, MRN [id number]  Patient Location: Home  Provider Location: Office  [facility]P:  [facility]  Cardiologist:  Dr [facility] Electrophysiologist:  None   Chief Complaint:  Hospital follow up  History of Present Illness:    Gary Wheeler is a 73 y.o. male who presents via audio/video conferencing for a telehealth visit today.   Seen for the following medical problems.    Last seen in clinic in 2016, followed by [facility] cardiology over the last few years.     1. CAD - admit 12/2014 with STEMI, received DES x 2 to LAD and a DES to D2. A 70% ramus lesion was medically managed, a 90% 3rd RPLB lesion was though too distal for stenting and managed medically.  - 12/2014 echo LVEF 45-50%   - admit 08/2018 with NSTEMI.  - 08/2018 cath as reported below, received DES x 2 to LAD/D2. RPL lesion managed medically, if recurrent symptoms could consider PCI. Recs for >12 month DAPT due to overlapping stents in LAD with bifurcation lesion. , could reduce to brillinta 60mg  bid at 1 year and stop ASA - due to cost he was changed from brillinta to plavix. Due to itching later changed back to brillinta, we have been working trying to coordinate him getting from the [place]  - no recent chest pain. No SOB or DOE. Remains active in his yard.  - compliant with meds   2. HL - lipitor causes diarrhea, not able to take and stopped on his own. - tolerating low dose crestor - Jan 2020 TC 111 TG 68 HDL 37 LDL 60  3. HTN - compliant with meds - home bp's range 120-130ss/80s    The patient does not have symptoms concerning for COVID-19 infection (fever, chills, cough, or new shortness of breath).    Past Medical History:  Diagnosis Date  . Barrett's esophagus 2008  . CAD (coronary artery disease) 01/09/15   a. cath 01/09/15 PCI with  2 DES stents in LAD/1 in Dx with comcomitant CAD in distal LAD, RI.   Marland Kitchen. Complication of anesthesia    SLOW TO WAKE   . Diabetes mellitus without complication (HCC)   . Hypercholesterolemia   . Hypertension   . STEMI (ST elevation myocardial infarction) (HCC) 01/09/15   Past Surgical History:  Procedure Laterality Date  . BIOPSY  03/18/2018   Procedure: BIOPSY;  Surgeon: Corbin Adeourk, Robert M, MD;  Location[facility];  Service: Endoscopy;;  esophageal biopsies  . CARDIAC CATHETERIZATION N/A 01/09/2015    Procedure: Left Heart Cath and Coronary Angiography;  Surgeon: Marykay Lexavid W Harding, MD;  [facility];  Service: Cardiovascular;  Laterality: N/A;  . CARDIAC CATHETERIZATION  01/09/2015   with 3 stents  . COLONOSCOPY  2008   Dr. Linna DarnerAnwar: normal  . COLONOSCOPY N/A 03/18/2018   diverticulosis in sigmoid and descending  . CORONARY STENT INTERVENTION N/A 09/01/2018   Procedure: CORONARY STENT INTERVENTION;  Surgeon: Marykay LexHarding, David W, MD;  [facility];  Service: Cardiovascular;  Laterality: N/A;  Lad, RCA  . ESOPHAGOGASTRODUODENOSCOPY  2008   Dr. Linna DarnerAnwar: Barrett's. Path with a minute fragment of squamous cell epithelium suggestive of dysplasia  . ESOPHAGOGASTRODUODENOSCOPY N/A 03/18/2018   DR. Rourk: Barrett's esophagus, small hiatal hernia, minimal polypoid gastric mucosa,normal duodenum, surveillance in 3 years  . LEFT HEART CATH AND CORONARY ANGIOGRAPHY N/A 09/01/2018   Procedure: LEFT HEART CATH AND CORONARY ANGIOGRAPHY;  Surgeon: Marykay LexHarding, David W, MD;  [facility];  Service: Cardiovascular;  Laterality: N/A;  . left ORIF ankle       No outpatient medications have been marked as taking for the 11/02/18 encounter (Appointment) with Antoine PocheBranch, Wrigley Plasencia F, MD.     Allergies:   Lipitor [atorvastatin] and Pravastatin   Social History   Tobacco Use  . Smoking status: Current Some Day Smoker    Types: Cigars    Last attempt to quit: 07/30/1999    Years since quitting: 19.2  . Smokeless tobacco: Never Used  Substance Use Topics  . Alcohol use: Yes    Alcohol/week: 0.0 standard drinks    Comment: rare, occasional mixed drink   . Drug use: No     Family Hx: The patient's family history includes Coronary artery disease in his father and paternal grandfather; Prostate cancer in his brother. There is no history of Colon cancer or Colon polyps.  ROS:   Please see the history of present illness.     All other systems reviewed and are negative.   Prior CV  studies:   The following studies were reviewed today:  12/2014 Cath 1. Mid LAD to Dist LAD lesion, 95% stenosed. 2 overlapping drug-eluting stents (Xience Alpine 2.5 mm x 18 mm, 2.5 mm x 8 mm - postdilated to 3.25 mm proximally and 2.7 mm distally) were placed. There is a 0% residual stenosis post intervention. 2. Ost 2nd Diag to 2nd Diag lesion, 90% stenosed. A Xience Alpine 2.25 mm x 12 mm drug-eluting stent was placed - mini crush technique with the LAD stent. There is a 0% residual stenosis post intervention. 3. Dist LAD lesion, 99% stenosed due to distal thrombus embolization. Also mild residual wire related dissection in the distal D2 with distal embolus of thrombus 4. Ramus lesion, 70% stenosed. Consider medical management for now. 5. Proximal 3rd RPLB-1 lesion, 65% stenosed. Inferior Nellie Pester of distal 3rd RPLB-2 lesion, 90% stenosed. Likely too far distal for PCI. Consider medical management  for now. 6. Mid LAD lesion, 40% stenosed. 7. There is mild left ventricular systolic dysfunction. With distal anterior and inferoapical hypokinesis. This would likely explain the inferior ST elevations.  Multivessel disease with downstream disease in the distal LAD and RPL system as well as moderate caliber ramus intermedius lesion. Successful complex bifurcation stenting of the mid LAD/D2 with mini crush technique and kissing balloon stent post-dilation.  Recommendations:  Standard radial band removal post PCI  Will continue Aggrastat for 12 hours post PCI for the distal thrombus embolization and D2 distal dissection  Will run IV nitroglycerin for blood pressure and post PCI residual angina  Restart home blood pressure medications  Sliding scale insulin with glipizide. Hold metformin 48 hours. He will likely need aggressive glycemic control..  12/2014 echo Study Conclusions  - Left ventricle: The cavity size was normal. Systolic function was mildly reduced. The estimated ejection  fraction was in the range of 45% to 50%. There is akinesis of the apicalinferior and apical myocardium. Left ventricular diastolic function parameters were normal. No evidence of thrombus.  Labs/Other Tests and Data Reviewed:    EKG:  n/a  Recent Labs: 08/30/2018: Magnesium 1.8 09/02/2018: BUN 11; Creatinine, Ser 0.82; Hemoglobin 13.2; Platelets 200; Potassium 3.7; Sodium 139   Recent Lipid Panel Lab Results  Component Value Date/Time   CHOL 111 08/24/2018 09:46 AM   TRIG 68 08/24/2018 09:46 AM   HDL 37 (L) 08/24/2018 09:46 AM   CHOLHDL 3.0 08/24/2018 09:46 AM   LDLCALC 60 08/24/2018 09:46 AM    Wt Readings from Last 3 Encounters:  09/02/18 196 lb 13.9 oz (89.3 kg)  08/24/18 200 lb (90.7 kg)  07/09/18 200 lb 9.6 oz (91 kg)     Objective:    Vital Signs:  There were no vitals taken for this visit.   Normal affect, Normal speech patterns. Sounds comfortable, in no distress.   ASSESSMENT & PLAN:    1. CAD - recent NSTEMI, PCI as reported above - continue medical therapy. Interventional cardiology recs for extended DAPT due to heavy overlapping stent burden and bifurcation lesion stenting.   2. HL - diarrhea on lipitor, pravastatin causes similar symptoms - tolerating low dose crestor, continue  3. HTN - home bp's at goal, continue current meds  COVID-19 Education: The signs and symptoms of COVID-19 were discussed with the patient and how to seek care for testing (follow up with PCP or arrange E-visit).  The importance of social distancing was discussed today.  Time:   Today, I have spent 24 minutes with the patient with telehealth technology discussing the above problems.     Medication Adjustments/Labs and Tests Ordered: Current medicines are reviewed at length with the patient today.  Concerns regarding medicines are outlined above.  Tests Ordered: No orders of the defined types were placed in this encounter.  Medication Changes: No orders of the  defined types were placed in this encounter.   Disposition:  Follow up 4 months  Signed, Dina Rich, MD  11/02/2018 8:32 AM    [facility]

## 2018-11-02 NOTE — Patient Instructions (Signed)
Your physician recommends that you schedule a follow-up appointment in: 4 MONTHS WITH DR BRANCH  Your physician recommends that you continue on your current medications as directed. Please refer to the Current Medication list given to you today.  Thank you for choosing [facility]!!    

## 2019-03-05 ENCOUNTER — Ambulatory Visit: Payer: Non-veteran care | Admitting: Cardiology

## 2019-05-14 ENCOUNTER — Ambulatory Visit: Payer: Non-veteran care | Admitting: Cardiology

## 2019-06-03 ENCOUNTER — Other Ambulatory Visit (HOSPITAL_COMMUNITY): Payer: Self-pay | Admitting: Internal Medicine

## 2019-06-03 ENCOUNTER — Encounter: Payer: Self-pay | Admitting: Internal Medicine

## 2019-06-03 DIAGNOSIS — N63 Unspecified lump in unspecified breast: Secondary | ICD-10-CM

## 2019-06-08 ENCOUNTER — Ambulatory Visit (HOSPITAL_COMMUNITY): Payer: No Typology Code available for payment source

## 2019-06-08 ENCOUNTER — Ambulatory Visit (HOSPITAL_COMMUNITY): Admission: RE | Admit: 2019-06-08 | Payer: No Typology Code available for payment source | Source: Ambulatory Visit

## 2019-06-08 ENCOUNTER — Other Ambulatory Visit: Payer: Self-pay

## 2019-06-08 ENCOUNTER — Ambulatory Visit (HOSPITAL_COMMUNITY)
Admission: RE | Admit: 2019-06-08 | Discharge: 2019-06-08 | Disposition: A | Discharge status: Home / Self Care | Payer: No Typology Code available for payment source | Source: Ambulatory Visit | Attending: Internal Medicine | Admitting: Internal Medicine

## 2019-06-08 DIAGNOSIS — N63 Unspecified lump in unspecified breast: Secondary | ICD-10-CM

## 2021-02-26 ENCOUNTER — Encounter: Payer: Self-pay | Admitting: Internal Medicine

## 2021-06-03 IMAGING — MG DIGITAL DIAGNOSTIC BILAT W/ TOMO W/ CAD
6 of 12 series · 6 of 36 positions shown · non-contrast
Comparison: None

ACR Breast Density Category a: The breast tissue is almost entirely
fatty.

CLINICAL DATA: 73-year-old male with mild tenderness in the
RETROAREOLAR RIGHT breast.

EXAM:
DIGITAL DIAGNOSTIC BILATERAL MAMMOGRAM WITH CAD AND TOMO

[R MLO synth-2D (1 of 2)]
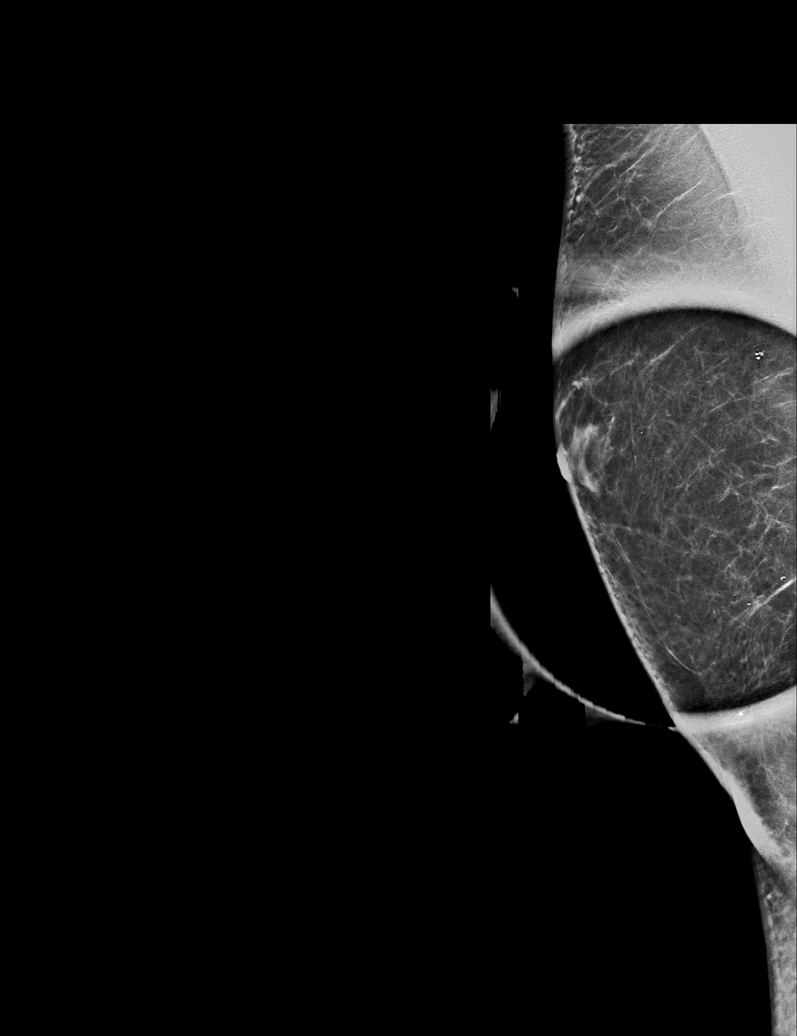

[L MLO synth-2D]
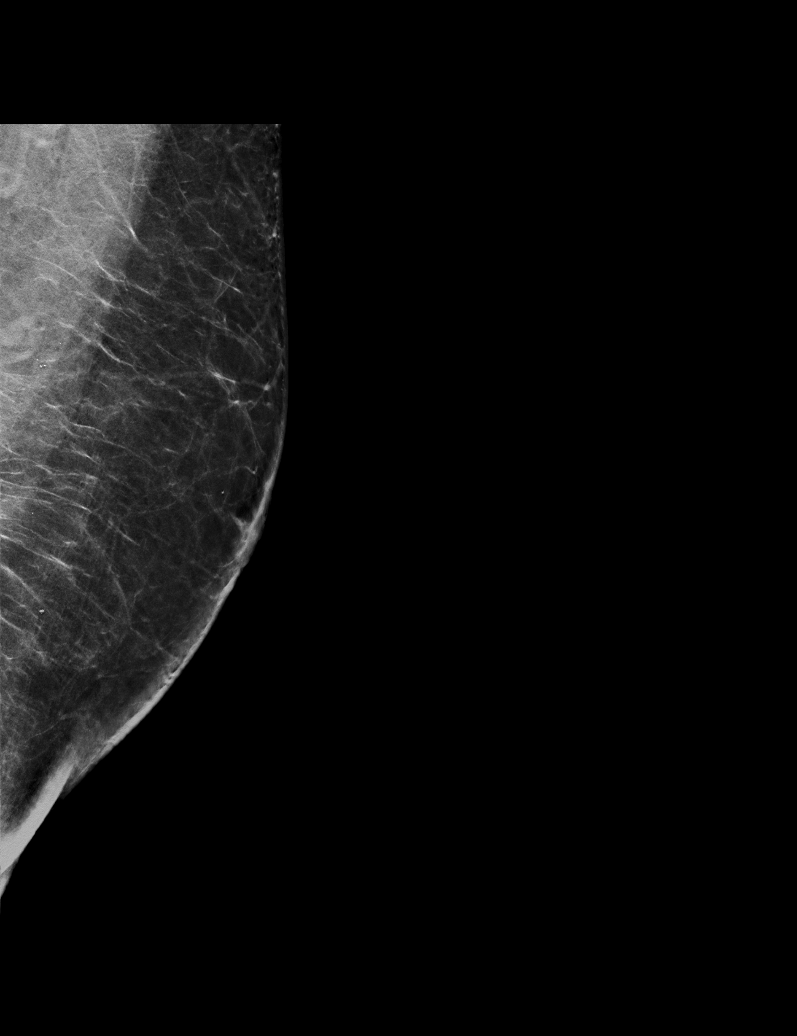

[L CC synth-2D (1 of 2)]
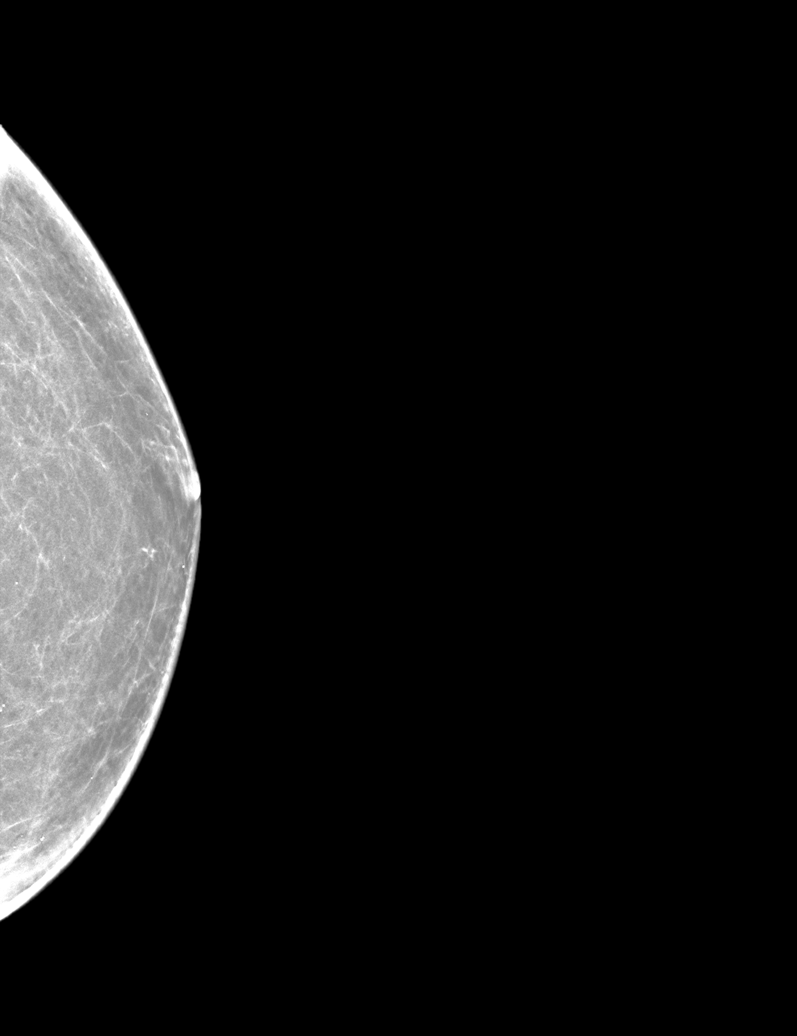

[L CC synth-2D (2 of 2)]
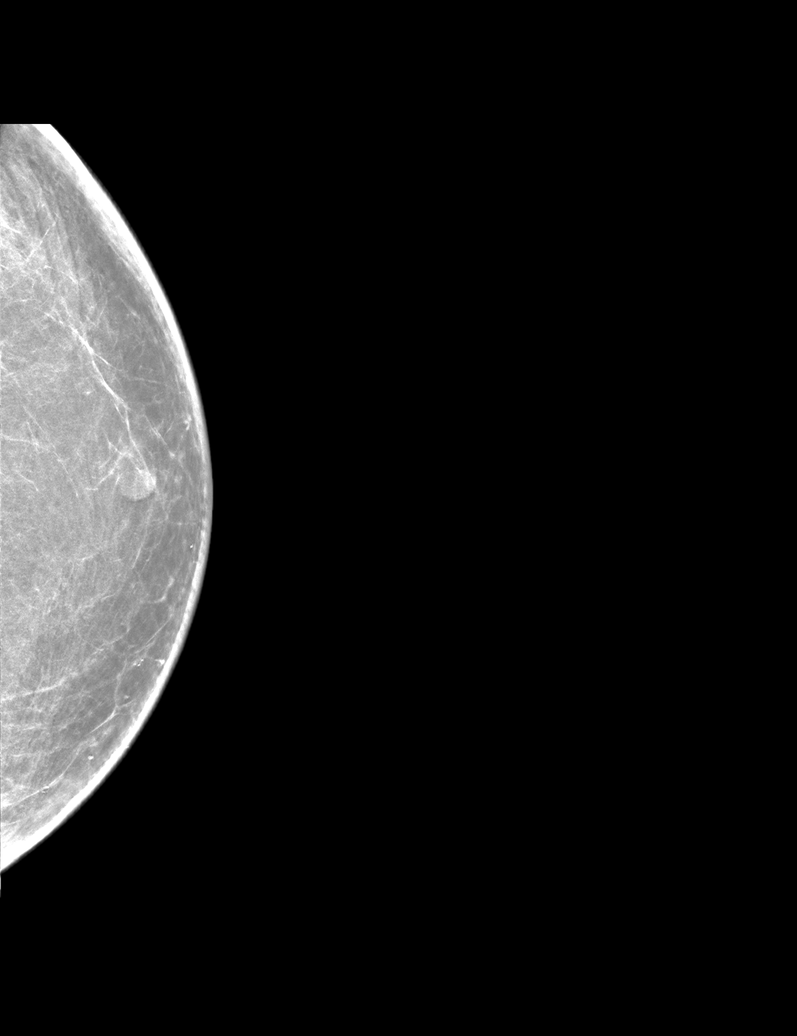

[R MLO synth-2D (2 of 2)]
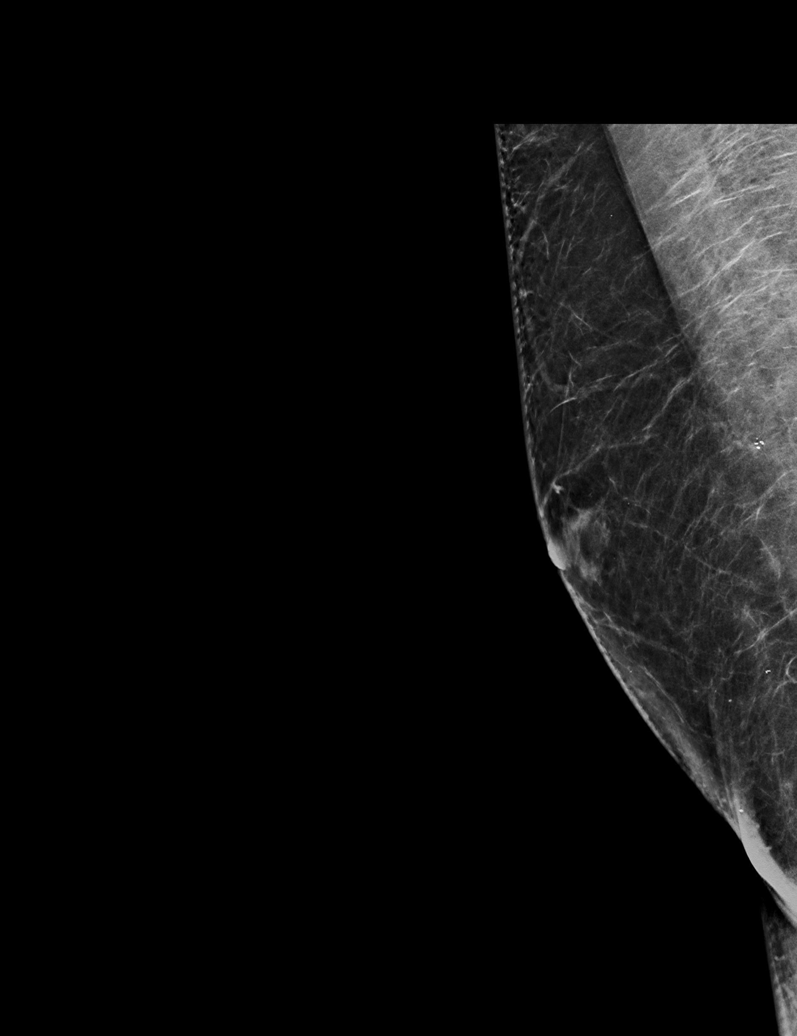

[R CC synth-2D]
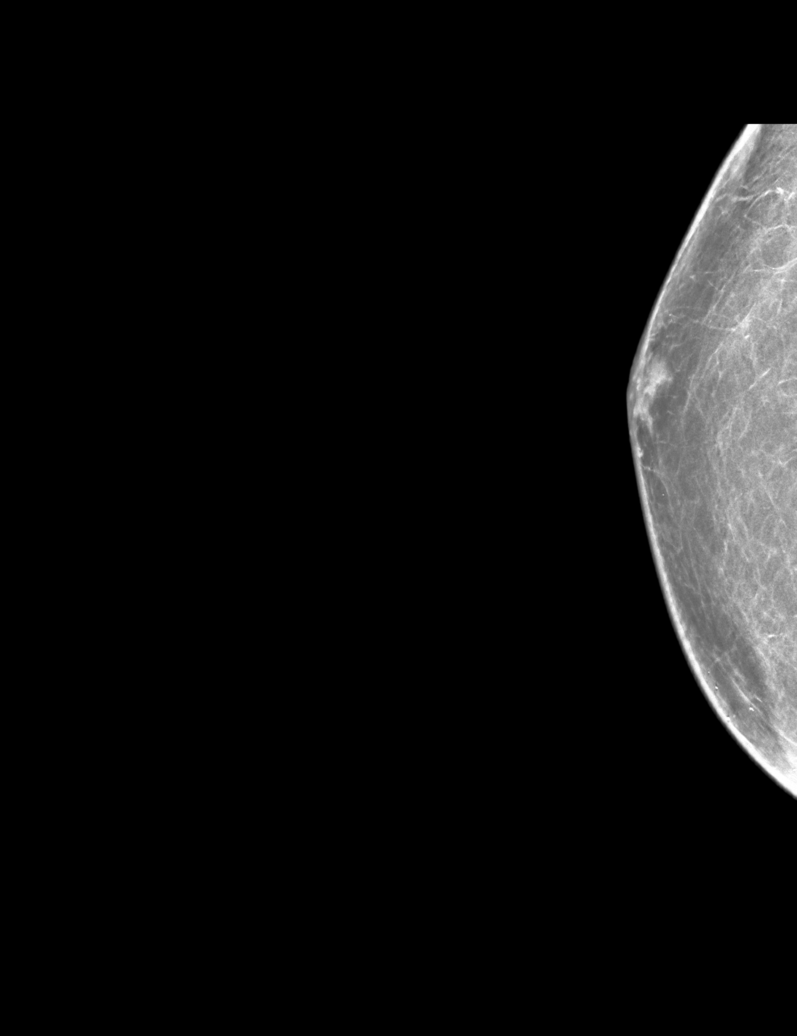

[6 of 36 positions shown; findings below may reference images not displayed]

FINDINGS: 2D/3D full field views of both breasts demonstrate no suspicious
mass, distortion or worrisome calcifications.

A small amount of normal fibroglandular tissue in the RETROAREOLAR
RIGHT breast is identified.

Mammographic images were processed with CAD.

On physical examination, minimal thickening in the RETROAREOLAR
RIGHT breast identified without discrete mass.
IMPRESSION: 1. Mild RIGHT gynecomastia.
2. No mammographic evidence of breast malignancy.

RECOMMENDATION:
Clinical follow-up as indicated.

I have discussed the findings, causes of gynecomastia and
recommendations with the patient.

BI-RADS CATEGORY  2: Benign.

## 2021-08-15 ENCOUNTER — Encounter (HOSPITAL_BASED_OUTPATIENT_CLINIC_OR_DEPARTMENT_OTHER): Payer: Self-pay

## 2021-08-15 DIAGNOSIS — G4733 Obstructive sleep apnea (adult) (pediatric): Secondary | ICD-10-CM

## 2021-09-23 ENCOUNTER — Other Ambulatory Visit: Payer: Self-pay

## 2021-09-23 ENCOUNTER — Ambulatory Visit: Payer: Medicare Other | Attending: Neurology | Admitting: Neurology

## 2021-09-23 DIAGNOSIS — G4733 Obstructive sleep apnea (adult) (pediatric): Secondary | ICD-10-CM

## 2021-09-26 NOTE — Procedures (Signed)
[place] A. Merlene Laughter, MD     [email or web address]             NOCTURNAL POLYSOMNOGRAPHY   LOCATION: [facility]   Patient Name: Ferlando, Lia Date: 09/23/2021 Gender: Male D.O.B: 09-Aug-1945 Age (years): 76 Referring Provider: Alice Reichert Height (inches): 70 Interpreting Physician: Phillips Odor MD, ABSM Weight (lbs): 202 RPSGT: Rosebud Poles BMI: 29 MRN: [id number] Neck Size: 16.50 CLINICAL INFORMATION Sleep Study Type: Split Night CPAP     Indication for sleep study: N/A     Epworth Sleepiness Score:  SLEEP STUDY TECHNIQUE As per the AASM Manual for the Scoring of Sleep and Associated Events v2.3 (April 2016) with a hypopnea requiring 4% desaturations.  The channels recorded and monitored were frontal, central and occipital EEG, electrooculogram (EOG), submentalis EMG (chin), nasal and oral airflow, thoracic and abdominal wall motion, anterior tibialis EMG, snore microphone, electrocardiogram, and pulse oximetry. Continuous positive airway pressure (CPAP) was initiated when the patient met split night criteria and was titrated according to treat sleep-disordered breathing.  MEDICATIONS Medications self-administered by patient taken the night of the study : N/A  Current Outpatient Medications:    acetaminophen (TYLENOL) 325 MG tablet, Take 2 tablets (650 mg total) by mouth every 6 (six) hours as needed for mild pain (or Fever >/= 101)., Disp: , Rfl:    amLODipine (NORVASC) 10 MG tablet, Take 5 mg by mouth daily. , Disp: , Rfl:    aspirin EC 81 MG tablet, Take 81 mg by mouth daily., Disp: , Rfl:    cholecalciferol (VITAMIN D) 1000 UNITS tablet, Take 4,000 Units by mouth daily. , Disp: , Rfl:    insulin aspart (NOVOLOG FLEXPEN) 100 UNIT/ML FlexPen, Inject 10-20 Units into the skin 3 (three) times daily with meals. Per sliding scale, Disp: , Rfl:    insulin glargine (LANTUS) 100 UNIT/ML injection, Inject 24 Units into the skin daily.,  Disp: , Rfl:    isosorbide mononitrate (IMDUR) 120 MG 24 hr tablet, Take 120 mg by mouth daily., Disp: , Rfl:    ketotifen (ZADITOR) 0.025 % ophthalmic solution, Place 1 drop into both eyes 2 (two) times daily as needed (for dry eyes)., Disp: , Rfl:    lisinopril-hydrochlorothiazide (PRINZIDE,ZESTORETIC) 20-12.5 MG per tablet, Take 1 tablet by mouth 2 (two) times daily. , Disp: , Rfl:    loratadine (CLARITIN) 10 MG tablet, Take 10 mg by mouth daily as needed for allergies., Disp: , Rfl:    metoprolol tartrate (LOPRESSOR) 25 MG tablet, Take 12.5 mg by mouth 2 (two) times daily., Disp: , Rfl:    nitroGLYCERIN (NITROSTAT) 0.4 MG SL tablet, Place 1 tablet (0.4 mg total) under the tongue every 5 (five) minutes x 3 doses as needed for chest pain., Disp: 25 tablet, Rfl: 12   omeprazole (PRILOSEC) 20 MG capsule, Take 20 mg by mouth daily., Disp: , Rfl:    potassium chloride (K-DUR,KLOR-CON) 10 MEQ tablet, Take 10 mEq by mouth daily as needed (for cramping)., Disp: , Rfl:    rosuvastatin (CRESTOR) 5 MG tablet, Take 5 mg by mouth at bedtime. , Disp: , Rfl:    ticagrelor (BRILINTA) 90 MG TABS tablet, Take 1 tablet (90 mg total) by mouth 2 (two) times daily., Disp: 180 tablet, Rfl: 1   vitamin B-12 (CYANOCOBALAMIN) 1000 MCG tablet, Take 2,000 mcg by mouth daily. , Disp: , Rfl:    RESPIRATORY PARAMETERS Diagnostic  Total AHI (/hr): 27.1 RDI (/hr): 27.1 OA Index (/hr): 3.3 CA Index (/hr): 0.0  REM AHI (/hr): 40.0 NREM AHI (/hr): 21.2 Supine AHI (/hr): 56.0 Non-supine AHI (/hr): 23.7 Min O2 Sat (%): 78.00 Mean O2 (%): 89.75 Time below 88% (min): 46   Titration  Optimal Pressure (cm):  AHI at Optimal Pressure (/hr): N/A Min O2 at Optimal Pressure (%): 85.00 Supine % at Optimal (%): N/A Sleep % at Optimal (%): N/A   SLEEP ARCHITECTURE The recording time for the entire night was 399.3 minutes.  During a baseline period of 185.5 minutes, the patient slept for 144.0 minutes in REM and nonREM, yielding a sleep  efficiency of 77.6%. Sleep onset after lights out was 21.8 minutes with a REM latency of 86.5 minutes. The patient spent 8.68% of the night in stage N1 sleep, 53.13% in stage N2 sleep, 6.94% in stage N3 and 31.3% in REM.     During the titration period of 202.8 minutes, the patient slept for 158.5 minutes in REM and nonREM, yielding a sleep efficiency of 78.2%. Sleep onset after CPAP initiation was 34.7 minutes with a REM latency of 66.0 minutes. The patient spent 8.20% of the night in stage N1 sleep, 70.35% in stage N2 sleep, 10.73% in stage N3 and 10.7% in REM.  CARDIAC DATA The 2 lead EKG demonstrated sinus rhythm. The mean heart rate was 57.70 beats per minute. Other EKG findings include: None.  LEG MOVEMENT DATA The total Periodic Limb Movements of Sleep (PLMS) were 0. The PLMS index was 0.00.  IMPRESSIONS  Moderate obstructive sleep syndrome is documented with this study. Titration was attempted but sub-optimal response was noted at 17 with the patient difficulties tolerating this pressure. Given this, auto  8-20 is recommended.   Delano Metz, MD Diplomate, American Board of Sleep Medicine.   ELECTRONICALLY SIGNED ON:  09/26/2021, 6:09 PM [place] PH: [phone]   FX: [phone] [place]

## 2021-12-27 ENCOUNTER — Telehealth: Payer: Self-pay

## 2021-12-27 NOTE — Telephone Encounter (Signed)
NOTES SCANNED TO REFERRAL 

## 2022-02-05 ENCOUNTER — Institutional Professional Consult (permissible substitution): Payer: Non-veteran care | Admitting: Internal Medicine

## 2022-02-19 DIAGNOSIS — I4891 Unspecified atrial fibrillation: Secondary | ICD-10-CM | POA: Insufficient documentation

## 2022-02-20 ENCOUNTER — Telehealth: Payer: Self-pay | Admitting: Pharmacist

## 2022-02-20 ENCOUNTER — Encounter: Payer: Self-pay | Admitting: Internal Medicine

## 2022-02-20 ENCOUNTER — Ambulatory Visit (INDEPENDENT_AMBULATORY_CARE_PROVIDER_SITE_OTHER): Payer: No Typology Code available for payment source | Admitting: Internal Medicine

## 2022-02-20 DIAGNOSIS — I4891 Unspecified atrial fibrillation: Secondary | ICD-10-CM

## 2022-02-20 NOTE — Progress Notes (Signed)
ELECTROPHYSIOLOGY CONSULT NOTE  Patient ID: Gary Wheeler, MRN: [id number], DOB/AGE: 76-Oct-1947 76 y.o. Admit date: (Not on file) Date of Consult: 02/20/2022  Primary Physician: [facility] Primary Cardiologist: new      Gary Wheeler is a 76 y.o. male who is being seen today for the evaluation of Afib at the request of [facility] [place].      HPI Gary Wheeler is a 76 y.o. male referred from the [place] for management of atrial fibrillation.  It was initially detected on a routine evaluation.  There may have been some associated shortness of breath.  He underwent anticoagulation and subsequent cardioversion.  He held sinus rhythm for a couple of weeks and then reverted to atrial fibrillation.  It was his impression and that of his wife that he felt a little better in this interval, perhaps less shortness of breath and less fatigue.  He remains on anticoagulation without significant bleeding.  Has persistent atrial fibrillation.  Some palpitations when on his left side.  No edema or nocturnal dyspnea.  Underwent a sleep study 3/23 at request of the [place]; report was reviewed from [facility] and showed an AHI of 25  He and his wife recall an echocardiogram at the [place] and being told that it was "normal "  History of coronary artery disease 6/16 presented with STEMI with DES x2 to the LAD and DES to D2. Non-STEMI 2/20 again with stenting DATE TEST EF   6/16 Echo   45-50 %   1/20 Echo  55-60%   2/20 LHC  35-45 % (poor quality) LAD prior stents patent,  LADm 95%>>stent        Date Cr K Hgb              Thromboembolic risk factors ( age  -2, HTN-1, Vasc disease -1, CHF) for a CHADSVASc Score of >=4    Past Medical History:  Diagnosis Date   Barrett's esophagus 2008   CAD (coronary artery disease) 01/09/15   a. cath 01/09/15 PCI with 2 DES stents in LAD/1 in Dx with comcomitant CAD in distal LAD, RI.    Complication of anesthesia    SLOW TO WAKE    Diabetes  mellitus without complication (HCC)    Hypercholesterolemia    Hypertension    STEMI (ST elevation myocardial infarction) (HCC) 01/09/15      Surgical History:  Past Surgical History:  Procedure Laterality Date   BIOPSY  03/18/2018   Procedure: BIOPSY;  Surgeon: Corbin Ade, MD;  [facility];  Service: Endoscopy;;  esophageal biopsies   CARDIAC CATHETERIZATION N/A 01/09/2015   Procedure: Left Heart Cath and Coronary Angiography;  Surgeon: Marykay Lex, MD;  Location: [facility] INVASIVE CV LAB;  Service: Cardiovascular;  Laterality: N/A;   CARDIAC CATHETERIZATION  01/09/2015   with 3 stents   COLONOSCOPY  2008   Dr. Linna Darner: normal   COLONOSCOPY N/A 03/18/2018   diverticulosis in sigmoid and descending   CORONARY STENT INTERVENTION N/A 09/01/2018   Procedure: CORONARY STENT INTERVENTION;  Surgeon: Marykay Lex, MD;  Location: [facility] INVASIVE CV LAB;  Service: Cardiovascular;  Laterality: N/A;  Lad, RCA   ESOPHAGOGASTRODUODENOSCOPY  2008   Dr. Linna Darner: Barrett's. Path with a minute fragment of squamous cell epithelium suggestive of dysplasia   ESOPHAGOGASTRODUODENOSCOPY N/A 03/18/2018   DR. Rourk: Barrett's esophagus, small hiatal hernia, minimal polypoid gastric mucosa,normal duodenum, surveillance in 3 years   LEFT HEART  CATH AND CORONARY ANGIOGRAPHY N/A 09/01/2018   Procedure: LEFT HEART CATH AND CORONARY ANGIOGRAPHY;  Surgeon: Marykay Lex, MD;  Location: [facility] INVASIVE CV LAB;  Service: Cardiovascular;  Laterality: N/A;   left ORIF ankle       Home Meds: Current Meds  Medication Sig   acetaminophen (TYLENOL) 325 MG tablet Take 2 tablets (650 mg total) by mouth every 6 (six) hours as needed for mild pain (or Fever >/= 101).   amLODipine (NORVASC) 10 MG tablet Take 5 mg by mouth daily.    apixaban (ELIQUIS) 5 MG TABS tablet Take 5 mg by mouth 2 (two) times daily.   aspirin EC 81 MG tablet Take 81 mg by mouth daily.   cholecalciferol (VITAMIN D) 1000 UNITS tablet Take 4,000 Units  by mouth daily.    insulin aspart (NOVOLOG FLEXPEN) 100 UNIT/ML FlexPen Inject 20 Units into the skin 2 (two) times daily at 8 am and 10 pm. Per sliding scale   insulin glargine (LANTUS) 100 UNIT/ML injection Inject 30 Units into the skin daily.   isosorbide mononitrate (IMDUR) 120 MG 24 hr tablet Take 120 mg by mouth daily.   ketotifen (ZADITOR) 0.025 % ophthalmic solution Place 1 drop into both eyes 2 (two) times daily as needed (for dry eyes).   lisinopril-hydrochlorothiazide (PRINZIDE,ZESTORETIC) 20-12.5 MG per tablet Take 1 tablet by mouth 2 (two) times daily.    loratadine (CLARITIN) 10 MG tablet Take 10 mg by mouth daily as needed for allergies.   Magnesium 400 MG TABS Take by mouth. 1 per day   metoprolol tartrate (LOPRESSOR) 25 MG tablet Take 12.5 mg by mouth 2 (two) times daily.   nitroGLYCERIN (NITROSTAT) 0.4 MG SL tablet Place 1 tablet (0.4 mg total) under the tongue every 5 (five) minutes x 3 doses as needed for chest pain.   omeprazole (PRILOSEC) 20 MG capsule Take 20 mg by mouth daily.   potassium chloride (K-DUR,KLOR-CON) 10 MEQ tablet Take 10 mEq by mouth daily as needed (for cramping).   potassium chloride (KLOR-CON) 10 MEQ tablet Take 10 mEq by mouth daily.   rosuvastatin (CRESTOR) 5 MG tablet Take 5 mg by mouth at bedtime.    vitamin B-12 (CYANOCOBALAMIN) 1000 MCG tablet Take 2,000 mcg by mouth daily.     Allergies:  Allergies  Allergen Reactions   Lipitor [Atorvastatin] Diarrhea   Plavix [Clopidogrel Bisulfate]     Itching    Pravastatin Diarrhea    Social History   Socioeconomic History   Marital status: Married    Spouse name: Not on file   Number of children: Not on file   Years of education: Not on file   Highest education level: Not on file  Occupational History   Occupation: Retired, but works 2 days/week gas station  Tobacco Use   Smoking status: Former    Types: Cigars    Quit date: 07/30/1999    Years since quitting: 22.5   Smokeless tobacco: Never   Vaping Use   Vaping Use: Never used  Substance and Sexual Activity   Alcohol use: Yes    Alcohol/week: 0.0 standard drinks of alcohol    Comment: rare, occasional mixed drink    Drug use: No   Sexual activity: Not on file  Other Topics Concern   Not on file  Social History Narrative   Lives in [place], [place] with wife.   Social Determinants of Health   Financial Resource Strain: Not on file  Food Insecurity: Not on file  Transportation Needs: Not on file  Physical Activity: Not on file  Stress: Not on file  Social Connections: Not on file  Intimate Partner Violence: Not on file     Family History  Problem Relation Age of Onset   Coronary artery disease Father    Coronary artery disease Paternal Grandfather    Prostate cancer Brother    Colon cancer Neg Hx    Colon polyps Neg Hx      ROS:  Please see the history of present illness.     All other systems reviewed and negative.    Physical Exam:  Blood pressure 98/64, pulse 71, height 5\' 10"  (1.778 m), weight 205 lb 6.4 oz (93.2 kg), SpO2 98 %. General: Well developed, well nourished male in no acute distress. Head: Normocephalic, atraumatic, sclera non-icteric, no xanthomas, nares are without discharge. EENT: normal  Lymph Nodes:  none Neck: Negative for carotid bruits. JVD not elevated. Back:without scoliosis kyphosis  Lungs: Clear bilaterally to auscultation without wheezes, rales, or rhonchi. Breathing is unlabored. Heart: Irregularly irregular rate and rhythm  abdomen: Soft, non-tender, non-distended with normoactive bowel sounds. No hepatomegaly. No rebound/guarding. No obvious abdominal masses. Msk:  Strength and tone appear normal for age. Extremities: No clubbing or cyanosis. No  edema.  Distal pedal pulses are 2+ and equal bilaterally. Skin: Warm and Dry Neuro: Alert and oriented X 3. CN III-XII intact Grossly normal sensory and motor function . Psych:  Responds to questions appropriately with a normal  affect.        EKG:   Atrial fibrillation at 71 Interval-/09/37   Assessment and Plan:  Atrial fibrillation-persistent  Obstructive sleep apnea-untreated  CHF diastolic class 2b   Persistent atrial fibrillation with likely associated symptoms.  He has failed cardioversion and we discussed repeat cardioversion but in the context of antiarrhythmic support.  Given his relatively young age and his QTc, we have chosen to proceed with dofetilide, not sotalol because of relative lack of efficacy and not amiodarone because of its associated side effects.  Moreover, I am not a fan of dronaderone.  We need also to get the laboratories and the echo report from the [place]  We have copied his report from his sleep study to bring back to the [place] given the implications-untreated sleep apnea on atrial fibrillation recurrence and fatigue.  Stop his aspirin       Virl Axe

## 2022-02-20 NOTE — Patient Instructions (Signed)
Medication Instructions:  Your physician recommends that you continue on your current medications as directed. Please refer to the Current Medication list given to you today.  *If you need a refill on your cardiac medications before your next appointment, please call your pharmacy*   Lab Work: None ordered.  If you have labs (blood work) drawn today and your tests are completely normal, you will receive your results only by: MyChart Message (if you have MyChart) OR A paper copy in the mail If you have any lab test that is abnormal or we need to change your treatment, we will call you to review the results.   Testing/Procedures: Dr Graciela Husbands would like for you to be admitted to start Tikosyn and you will be contacted regarding this appointment   Follow-Up: At [facility], you and your health needs are our priority.  As part of our continuing mission to provide you with exceptional heart care, we have created designated Provider Care Teams.  These Care Teams include your primary Cardiologist (physician) and Advanced Practice Providers (APPs -  Physician Assistants and Nurse Practitioners) who all work together to provide you with the care you need, when you need it.  We recommend signing up for the patient portal called "MyChart".  Sign up information is provided on this After Visit Summary.  MyChart is used to connect with patients for Virtual Visits (Telemedicine).  Patients are able to view lab/test results, encounter notes, upcoming appointments, etc.  Non-urgent messages can be sent to your provider as well.   To learn more about what you can do with MyChart, go to [email or web address].    Your next appointment:   To be scheduled  Important Information About Sugar

## 2022-02-20 NOTE — Telephone Encounter (Signed)
Medication list reviewed in anticipation of upcoming Tikosyn initiation. Patient will need to be changed from lisinopril-HCTZ to just lisinopril due to contraindication between thiazide diuretics and Tikosyn.  Patient is anticoagulated on Eliquis 5mg  BID on the appropriate dose. Please ensure that patient has not missed any anticoagulation doses in the 3 weeks prior to Tikosyn initiation. Recommend checking updated BMET and CBC for routine DOAC monitoring, the most recent labs in Epic/Care Everywhere/[facility] are from 2020.  Patient will need to be counseled to avoid use of Benadryl while on Tikosyn and in the 2-3 days prior to Tikosyn initiation.

## 2022-03-01 ENCOUNTER — Other Ambulatory Visit (HOSPITAL_COMMUNITY): Payer: Self-pay | Admitting: *Deleted

## 2022-03-01 ENCOUNTER — Telehealth (HOSPITAL_COMMUNITY): Payer: Self-pay | Admitting: *Deleted

## 2022-03-01 MED ORDER — LISINOPRIL 20 MG PO TABS: 20.0000 mg | ORAL_TABLET | Freq: Every day | ORAL | 3 refills | Status: AC

## 2022-03-01 MED ORDER — LISINOPRIL 20 MG PO TABS: 20.0000 mg | ORAL_TABLET | Freq: Every day | ORAL | 3 refills | Status: DC

## 2022-03-01 NOTE — Telephone Encounter (Signed)
Authorization via [facility] 02/20/22-08/23/22 referral number is [id number] for inpatient hospital admission on 03/26/22.

## 2022-03-01 NOTE — Telephone Encounter (Signed)
[facility] 20mg  daily. Pt verbalized understanding.

## 2022-03-12 ENCOUNTER — Encounter (HOSPITAL_BASED_OUTPATIENT_CLINIC_OR_DEPARTMENT_OTHER): Payer: Self-pay

## 2022-03-12 DIAGNOSIS — G4733 Obstructive sleep apnea (adult) (pediatric): Secondary | ICD-10-CM

## 2022-03-15 ENCOUNTER — Ambulatory Visit: Payer: No Typology Code available for payment source | Attending: Neurology | Admitting: Neurology

## 2022-03-15 DIAGNOSIS — G4733 Obstructive sleep apnea (adult) (pediatric): Secondary | ICD-10-CM | POA: Diagnosis not present

## 2022-03-15 DIAGNOSIS — G4730 Sleep apnea, unspecified: Secondary | ICD-10-CM | POA: Insufficient documentation

## 2022-03-16 NOTE — Procedures (Signed)
[facility] Zane Samson A. Gerilyn Pilgrim, MD     [email or web address]             NOCTURNAL POLYSOMNOGRAPHY   LOCATION: [facility]  Patient Name: Gary Wheeler, Gary Wheeler Date: 03/15/2022 Gender: Male D.O.B: 10/03/1945 Age (years): 29 Referring Provider: Carolynn Sayers MD Height (inches): 70 Interpreting Physician: Beryle Beams MD, ABSM Weight (lbs): 205 RPSGT: Peak, Robert BMI: 29 MRN: [id number] Neck Size: 16.00 CLINICAL INFORMATION The patient is referred for a CPAP titration to treat sleep apnea.     Date of NPSG, Split Night or HST:  SLEEP STUDY TECHNIQUE As per the AASM Manual for the Scoring of Sleep and Associated Events v2.3 (April 2016) with a hypopnea requiring 4% desaturations.  The channels recorded and monitored were frontal, central and occipital EEG, electrooculogram (EOG), submentalis EMG (chin), nasal and oral airflow, thoracic and abdominal wall motion, anterior tibialis EMG, snore microphone, electrocardiogram, and pulse oximetry. Continuous positive airway pressure (CPAP) was initiated at the beginning of the study and titrated to treat sleep-disordered breathing.  MEDICATIONS Medications self-administered by patient taken the night of the study : N/A  Current Outpatient Medications:    acetaminophen (TYLENOL) 325 MG tablet, Take 2 tablets (650 mg total) by mouth every 6 (six) hours as needed for mild pain (or Fever >/= 101)., Disp: , Rfl:    amLODipine (NORVASC) 10 MG tablet, Take 5 mg by mouth daily. , Disp: , Rfl:    apixaban (ELIQUIS) 5 MG TABS tablet, Take 5 mg by mouth 2 (two) times daily., Disp: , Rfl:    aspirin EC 81 MG tablet, Take 81 mg by mouth daily., Disp: , Rfl:    cholecalciferol (VITAMIN D) 1000 UNITS tablet, Take 4,000 Units by mouth daily. , Disp: , Rfl:    insulin aspart (NOVOLOG FLEXPEN) 100 UNIT/ML FlexPen, Inject 20 Units into the skin 2 (two) times daily at 8 am and 10 pm. Per sliding scale, Disp: , Rfl:    insulin glargine  (LANTUS) 100 UNIT/ML injection, Inject 30 Units into the skin daily., Disp: , Rfl:    isosorbide mononitrate (IMDUR) 120 MG 24 hr tablet, Take 120 mg by mouth daily., Disp: , Rfl:    ketotifen (ZADITOR) 0.025 % ophthalmic solution, Place 1 drop into both eyes 2 (two) times daily as needed (for dry eyes)., Disp: , Rfl:    lisinopril (ZESTRIL) 20 MG tablet, Take 1 tablet (20 mg total) by mouth daily. Discontinue lisinopril/hctz, Disp: 30 tablet, Rfl: 3   loratadine (CLARITIN) 10 MG tablet, Take 10 mg by mouth daily as needed for allergies., Disp: , Rfl:    Magnesium 400 MG TABS, Take by mouth. 1 per day, Disp: , Rfl:    metoprolol tartrate (LOPRESSOR) 25 MG tablet, Take 12.5 mg by mouth 2 (two) times daily., Disp: , Rfl:    nitroGLYCERIN (NITROSTAT) 0.4 MG SL tablet, Place 1 tablet (0.4 mg total) under the tongue every 5 (five) minutes x 3 doses as needed for chest pain., Disp: 25 tablet, Rfl: 12   omeprazole (PRILOSEC) 20 MG capsule, Take 20 mg by mouth daily., Disp: , Rfl:    potassium chloride (K-DUR,KLOR-CON) 10 MEQ tablet, Take 10 mEq by mouth daily as needed (for cramping)., Disp: , Rfl:    potassium chloride (KLOR-CON) 10 MEQ tablet, Take 10 mEq by mouth daily., Disp: , Rfl:    rosuvastatin (CRESTOR) 5 MG tablet, Take 5 mg by mouth at bedtime. , Disp: , Rfl:    ticagrelor (BRILINTA) 90 MG TABS tablet,  Take 1 tablet (90 mg total) by mouth 2 (two) times daily. (Patient not taking: Reported on 02/20/2022), Disp: 180 tablet, Rfl: 1   vitamin B-12 (CYANOCOBALAMIN) 1000 MCG tablet, Take 2,000 mcg by mouth daily. , Disp: , Rfl:    TECHNICIAN COMMENTS Comments added by technician: Patient had difficulty initiating sleep. Comments added by scorer: N/A RESPIRATORY PARAMETERS Optimal PAP Pressure (cm): 13 AHI at Optimal Pressure (/hr): 0 Overall Minimal O2 (%): 83.00 Supine % at Optimal Pressure (%): 0 Minimal O2 at Optimal Pressure (%): 91.0   SLEEP ARCHITECTURE The study was initiated at 9:30:33  PM and ended at 4:54:23 AM. The patient was titrated between pressures of 5 and 13.  The patient did well at pressures of 12 and 13.  Sleep onset time was 40.3 minutes and the sleep efficiency was 70.1%. The total sleep time was 311 minutes.  The patient spent 14.47% of the night in stage N1 sleep, 59.32% in stage N2 sleep, 4.98% in stage N3 and 21.2% in REM.Stage REM latency was 65.5 minutes  Wake after sleep onset was 92.6. Alpha intrusion was absent. Supine sleep was 37.46%.  CARDIAC DATA The 2 lead EKG demonstrated atrial fibrillation. The mean heart rate was 85.67 beats per minute. Other EKG findings include: None.  LEG MOVEMENT DATA The total Periodic Limb Movements of Sleep (PLMS) were 0. The PLMS index was 0.00. A PLMS index of <15 is considered normal in adults.  IMPRESSIONS  1. Successful CPAP titration study. The optimal pressure is 13 but the patient also did well at a pressure of 12.   Argie Ramming, MD Diplomate, American Board of Sleep Medicine.  ELECTRONICALLY SIGNED ON:  03/16/2022, 3:38 PM  [facility] PH: [phone]   FX: [phone] ACCREDITED BY THE AMERICAN ACADEMY OF SLEEP MEDICINE

## 2022-03-18 ENCOUNTER — Ambulatory Visit (HOSPITAL_COMMUNITY): Payer: Non-veteran care | Admitting: Nurse Practitioner

## 2022-03-26 ENCOUNTER — Ambulatory Visit (HOSPITAL_COMMUNITY)
Admission: RE | Admit: 2022-03-26 | Discharge: 2022-03-26 | Disposition: A | Discharge status: Home / Self Care | Payer: No Typology Code available for payment source | Source: Ambulatory Visit | Attending: Nurse Practitioner | Admitting: Nurse Practitioner

## 2022-03-26 ENCOUNTER — Encounter (HOSPITAL_COMMUNITY): Payer: Self-pay | Admitting: Physician Assistant

## 2022-03-26 ENCOUNTER — Other Ambulatory Visit (HOSPITAL_COMMUNITY): Payer: Self-pay

## 2022-03-26 ENCOUNTER — Inpatient Hospital Stay (HOSPITAL_COMMUNITY)
Admission: AD | Admit: 2022-03-26 | Discharge: 2022-03-29 | DRG: 309 | Disposition: A | Discharge status: Home / Self Care | Payer: No Typology Code available for payment source | Source: Ambulatory Visit | Attending: Internal Medicine | Admitting: Internal Medicine

## 2022-03-26 VITALS — BP 126/92 | HR 73 | Ht 70.0 in | Wt 209.0 lb

## 2022-03-26 DIAGNOSIS — I5032 Chronic diastolic (congestive) heart failure: Secondary | ICD-10-CM | POA: Diagnosis present

## 2022-03-26 DIAGNOSIS — Z888 Allergy status to other drugs, medicaments and biological substances status: Secondary | ICD-10-CM

## 2022-03-26 DIAGNOSIS — E119 Type 2 diabetes mellitus without complications: Secondary | ICD-10-CM | POA: Diagnosis present

## 2022-03-26 DIAGNOSIS — Z7982 Long term (current) use of aspirin: Secondary | ICD-10-CM

## 2022-03-26 DIAGNOSIS — I110 Hypertensive heart disease with heart failure: Secondary | ICD-10-CM | POA: Diagnosis present

## 2022-03-26 DIAGNOSIS — Z8042 Family history of malignant neoplasm of prostate: Secondary | ICD-10-CM | POA: Diagnosis not present

## 2022-03-26 DIAGNOSIS — Z794 Long term (current) use of insulin: Secondary | ICD-10-CM | POA: Diagnosis not present

## 2022-03-26 DIAGNOSIS — Z79899 Other long term (current) drug therapy: Secondary | ICD-10-CM

## 2022-03-26 DIAGNOSIS — D6869 Other thrombophilia: Secondary | ICD-10-CM | POA: Insufficient documentation

## 2022-03-26 DIAGNOSIS — I252 Old myocardial infarction: Secondary | ICD-10-CM | POA: Diagnosis not present

## 2022-03-26 DIAGNOSIS — Z8249 Family history of ischemic heart disease and other diseases of the circulatory system: Secondary | ICD-10-CM | POA: Diagnosis not present

## 2022-03-26 DIAGNOSIS — Z955 Presence of coronary angioplasty implant and graft: Secondary | ICD-10-CM | POA: Diagnosis not present

## 2022-03-26 DIAGNOSIS — Z7901 Long term (current) use of anticoagulants: Secondary | ICD-10-CM | POA: Diagnosis not present

## 2022-03-26 DIAGNOSIS — I4819 Other persistent atrial fibrillation: Secondary | ICD-10-CM

## 2022-03-26 DIAGNOSIS — E7800 Pure hypercholesterolemia, unspecified: Secondary | ICD-10-CM | POA: Diagnosis present

## 2022-03-26 DIAGNOSIS — I2510 Atherosclerotic heart disease of native coronary artery without angina pectoris: Secondary | ICD-10-CM | POA: Diagnosis present

## 2022-03-26 LAB — MAGNESIUM
Magnesium: 1.5 mg/dL — ABNORMAL LOW (ref 1.7–2.4)
Magnesium: 2.5 mg/dL — ABNORMAL HIGH (ref 1.7–2.4)

## 2022-03-26 LAB — BASIC METABOLIC PANEL
Anion gap: 3 — ABNORMAL LOW (ref 5–15)
BUN: 14 mg/dL (ref 8–23)
CO2: 29 mmol/L (ref 22–32)
Calcium: 9 mg/dL (ref 8.9–10.3)
Chloride: 106 mmol/L (ref 98–111)
Creatinine, Ser: 0.98 mg/dL (ref 0.61–1.24)
GFR, Estimated: >60 mL/min (ref >60)
Glucose, Bld: 66 mg/dL — ABNORMAL LOW (ref 70–99)
Potassium: 3.9 mmol/L (ref 3.5–5.1)
Sodium: 138 mmol/L (ref 135–145)

## 2022-03-26 LAB — GLUCOSE, CAPILLARY: Glucose-Capillary: 153 mg/dL — ABNORMAL HIGH (ref 70–99)

## 2022-03-26 MED ORDER — DOFETILIDE 500 MCG PO CAPS
500.0000 ug | ORAL_CAPSULE | Freq: Two times a day (BID) | ORAL | Status: DC
  Administered 2022-03-26 – 2022-03-29 (×6): 500 ug via ORAL
  Filled 2022-03-26 (×6): qty 1

## 2022-03-26 MED ORDER — INSULIN GLARGINE-YFGN 100 UNIT/ML ~~LOC~~ SOLN
30.0000 [IU] | Freq: Every day | SUBCUTANEOUS | Status: DC
  Administered 2022-03-26 – 2022-03-28 (×3): 30 [IU] via SUBCUTANEOUS
  Filled 2022-03-26 (×4): qty 0.3

## 2022-03-26 MED ORDER — APIXABAN 5 MG PO TABS
5.0000 mg | ORAL_TABLET | Freq: Two times a day (BID) | ORAL | Status: DC
  Administered 2022-03-26 – 2022-03-29 (×6): 5 mg via ORAL
  Filled 2022-03-26 (×6): qty 1

## 2022-03-26 MED ORDER — POTASSIUM CHLORIDE CRYS ER 20 MEQ PO TBCR
40.0000 meq | EXTENDED_RELEASE_TABLET | Freq: Once | ORAL | Status: AC
Start: 2022-03-26 — End: 2022-03-26
  Administered 2022-03-26: 40 meq via ORAL
  Filled 2022-03-26: qty 2

## 2022-03-26 MED ORDER — KETOTIFEN FUMARATE 0.025 % OP SOLN
1.0000 [drp] | Freq: Two times a day (BID) | OPHTHALMIC | Status: DC | PRN
  Filled 2022-03-26: qty 5

## 2022-03-26 MED ORDER — SODIUM CHLORIDE 0.9 % IV SOLN: 250.0000 mL | INTRAVENOUS | Status: DC | PRN

## 2022-03-26 MED ORDER — LISINOPRIL 20 MG PO TABS
20.0000 mg | ORAL_TABLET | Freq: Every day | ORAL | Status: DC
  Administered 2022-03-27 – 2022-03-29 (×3): 20 mg via ORAL
  Filled 2022-03-26 (×3): qty 1

## 2022-03-26 MED ORDER — ISOSORBIDE MONONITRATE ER 60 MG PO TB24
120.0000 mg | ORAL_TABLET | Freq: Every day | ORAL | Status: DC
Start: 2022-03-27 — End: 2022-03-27
  Administered 2022-03-27: 120 mg via ORAL
  Filled 2022-03-26: qty 2

## 2022-03-26 MED ORDER — SODIUM CHLORIDE 0.9% FLUSH: 3.0000 mL | INTRAVENOUS | Status: DC | PRN

## 2022-03-26 MED ORDER — SODIUM CHLORIDE 0.9% FLUSH
3.0000 mL | Freq: Two times a day (BID) | INTRAVENOUS | Status: DC
  Administered 2022-03-26 – 2022-03-29 (×5): 3 mL via INTRAVENOUS

## 2022-03-26 MED ORDER — NITROGLYCERIN 0.4 MG SL SUBL
0.4000 mg | SUBLINGUAL_TABLET | SUBLINGUAL | Status: DC | PRN
Start: 2022-03-26 — End: 2022-03-29

## 2022-03-26 MED ORDER — METFORMIN HCL 500 MG PO TABS
500.0000 mg | ORAL_TABLET | Freq: Two times a day (BID) | ORAL | Status: DC
  Administered 2022-03-27 – 2022-03-29 (×5): 500 mg via ORAL
  Filled 2022-03-26 (×5): qty 1

## 2022-03-26 MED ORDER — INSULIN ASPART 100 UNIT/ML IJ SOLN
20.0000 [IU] | Freq: Two times a day (BID) | INTRAMUSCULAR | Status: DC
Start: 2022-03-26 — End: 2022-03-27
  Administered 2022-03-27: 20 [IU] via SUBCUTANEOUS

## 2022-03-26 MED ORDER — METOPROLOL TARTRATE 12.5 MG HALF TABLET
12.5000 mg | ORAL_TABLET | Freq: Two times a day (BID) | ORAL | Status: DC
  Administered 2022-03-26 – 2022-03-27 (×2): 12.5 mg via ORAL
  Filled 2022-03-26 (×2): qty 1

## 2022-03-26 MED ORDER — MAGNESIUM OXIDE -MG SUPPLEMENT 400 (240 MG) MG PO TABS
400.0000 mg | ORAL_TABLET | Freq: Every day | ORAL | Status: DC
  Administered 2022-03-27 – 2022-03-29 (×3): 400 mg via ORAL
  Filled 2022-03-26 (×3): qty 1

## 2022-03-26 MED ORDER — MAGNESIUM SULFATE 4 GM/100ML IV SOLN
4.0000 g | Freq: Once | INTRAVENOUS | Status: AC
  Administered 2022-03-26: 4 g via INTRAVENOUS
  Filled 2022-03-26: qty 100

## 2022-03-26 MED ORDER — VITAMIN B-12 1000 MCG PO TABS
2000.0000 ug | ORAL_TABLET | Freq: Every day | ORAL | Status: DC
  Administered 2022-03-27 – 2022-03-29 (×3): 2000 ug via ORAL
  Filled 2022-03-26 (×3): qty 2

## 2022-03-26 MED ORDER — ACETAMINOPHEN 325 MG PO TABS: 650.0000 mg | ORAL_TABLET | Freq: Four times a day (QID) | ORAL | Status: DC | PRN

## 2022-03-26 MED ORDER — ASPIRIN 81 MG PO TBEC
81.0000 mg | DELAYED_RELEASE_TABLET | Freq: Every day | ORAL | Status: DC
  Administered 2022-03-27 – 2022-03-29 (×3): 81 mg via ORAL
  Filled 2022-03-26 (×3): qty 1

## 2022-03-26 MED ORDER — LORATADINE 10 MG PO TABS
10.0000 mg | ORAL_TABLET | Freq: Every day | ORAL | Status: DC | PRN
Start: 2022-03-26 — End: 2022-03-29

## 2022-03-26 MED ORDER — ROSUVASTATIN CALCIUM 5 MG PO TABS
5.0000 mg | ORAL_TABLET | Freq: Every day | ORAL | Status: DC
  Administered 2022-03-26 – 2022-03-28 (×3): 5 mg via ORAL
  Filled 2022-03-26 (×3): qty 1

## 2022-03-26 MED ORDER — PANTOPRAZOLE SODIUM 40 MG PO TBEC
40.0000 mg | DELAYED_RELEASE_TABLET | Freq: Every day | ORAL | Status: DC
  Administered 2022-03-27 – 2022-03-29 (×3): 40 mg via ORAL
  Filled 2022-03-26 (×3): qty 1

## 2022-03-26 MED ORDER — AMLODIPINE BESYLATE 5 MG PO TABS
5.0000 mg | ORAL_TABLET | Freq: Every day | ORAL | Status: DC
  Administered 2022-03-27 – 2022-03-29 (×3): 5 mg via ORAL
  Filled 2022-03-26 (×3): qty 1

## 2022-03-26 MED ORDER — POTASSIUM CHLORIDE CRYS ER 10 MEQ PO TBCR: 10.0000 meq | EXTENDED_RELEASE_TABLET | Freq: Every day | ORAL | Status: DC | PRN

## 2022-03-26 NOTE — Care Management (Signed)
1456 03-26-22 Patient presented for Tikosyn Load, benefits check submitted. Case Manager will follow for cost and pharmacy of choice as the patient progresses. No further needs identified at this time.

## 2022-03-26 NOTE — Progress Notes (Signed)
Primary Care Physician: [facility] Primary Cardiologist: Dr El[facility] Primary Electrophysiologist: Dr Graciela Husbands Referring Physician: Dr Woody Seller is a 76 y.o. male with a history of CAD, HLD, HTN, OSA, DM, Barrett's esophagus, atrial fibrillation who presents for follow up in the [facility].  The patient was initially diagnosed with atrial fibrillation at a routine cardiology visit at the [place]. He was started on Eliquis for a CHADS2VASC score of 5 and underwent DCCV. Unfortunately, he reverted back to afib after a couple of weeks. He was seen by Dr Graciela Husbands 02/20/22 who recommended dofetilide.  On follow up today, patient presents for dofetilide loading. He remains in rate controlled afib today. He denies any missed doses of anticoagulation in the past 3 weeks. He stopped HCTZ 4 days ago.   Today, he denies symptoms of palpitations, chest pain, shortness of breath, orthopnea, PND, lower extremity edema, dizziness, presyncope, syncope, snoring, daytime somnolence, bleeding, or neurologic sequela. The patient is tolerating medications without difficulties and is otherwise without complaint today.    Atrial Fibrillation Risk Factors:  he does have symptoms or diagnosis of sleep apnea. he does not have a history of rheumatic fever.   he has a BMI of Body mass index is 29.99 kg/m.Marland Kitchen Filed Weights   03/26/22 1136  Weight: 94.8 kg    Family History  Problem Relation Age of Onset   Coronary artery disease Father    Coronary artery disease Paternal Grandfather    Prostate cancer Brother    Colon cancer Neg Hx    Colon polyps Neg Hx      Atrial Fibrillation Management history:  Previous antiarrhythmic drugs: none Previous cardioversions: 2023 at the [place] Previous ablations: none CHADS2VASC score: 5 Anticoagulation history: Eliquis   Past Medical History:  Diagnosis Date   Barrett's esophagus 2008   CAD (coronary  artery disease) 01/09/15   a. cath 01/09/15 PCI with 2 DES stents in LAD/1 in Dx with comcomitant CAD in distal LAD, RI.    Complication of anesthesia    SLOW TO WAKE    Diabetes mellitus without complication (HCC)    Hypercholesterolemia    Hypertension    STEMI (ST elevation myocardial infarction) (HCC) 01/09/15   Past Surgical History:  Procedure Laterality Date   BIOPSY  03/18/2018   Procedure: BIOPSY;  Surgeon: Corbin Ade, MD;  [facility];  Service: Endoscopy;;  esophageal biopsies   CARDIAC CATHETERIZATION N/A 01/09/2015   Procedure: Left Heart Cath and Coronary Angiography;  Surgeon: Marykay Lex, MD;  Location: [facility] INVASIVE CV LAB;  Service: Cardiovascular;  Laterality: N/A;   CARDIAC CATHETERIZATION  01/09/2015   with 3 stents   COLONOSCOPY  2008   Dr. Linna Darner: normal   COLONOSCOPY N/A 03/18/2018   diverticulosis in sigmoid and descending   CORONARY STENT INTERVENTION N/A 09/01/2018   Procedure: CORONARY STENT INTERVENTION;  Surgeon: Marykay Lex, MD;  Location: [facility] INVASIVE CV LAB;  Service: Cardiovascular;  Laterality: N/A;  Lad, RCA   ESOPHAGOGASTRODUODENOSCOPY  2008   Dr. Linna Darner: Barrett's. Path with a minute fragment of squamous cell epithelium suggestive of dysplasia   ESOPHAGOGASTRODUODENOSCOPY N/A 03/18/2018   DR. Rourk: Barrett's esophagus, small hiatal hernia, minimal polypoid gastric mucosa,normal duodenum, surveillance in 3 years   LEFT HEART CATH AND CORONARY ANGIOGRAPHY N/A 09/01/2018   Procedure: LEFT HEART CATH AND CORONARY ANGIOGRAPHY;  Surgeon: Marykay Lex, MD;  Location: [facility] INVASIVE CV LAB;  Service: Cardiovascular;  Laterality: N/A;   left ORIF ankle      Current Outpatient Medications  Medication Sig Dispense Refill   acetaminophen (TYLENOL) 325 MG tablet Take 2 tablets (650 mg total) by mouth every 6 (six) hours as needed for mild pain (or Fever >/= 101).     amLODipine (NORVASC) 10 MG tablet Take 5 mg by mouth daily.      apixaban  (ELIQUIS) 5 MG TABS tablet Take 5 mg by mouth 2 (two) times daily.     aspirin EC 81 MG tablet Take 81 mg by mouth daily.     cholecalciferol (VITAMIN D) 1000 UNITS tablet Take 4,000 Units by mouth daily.      insulin aspart (NOVOLOG FLEXPEN) 100 UNIT/ML FlexPen Inject 20 Units into the skin 2 (two) times daily at 8 am and 10 pm. Per sliding scale     insulin glargine (LANTUS) 100 UNIT/ML injection Inject 30 Units into the skin daily.     isosorbide mononitrate (IMDUR) 120 MG 24 hr tablet Take 120 mg by mouth daily.     ketotifen (ZADITOR) 0.025 % ophthalmic solution Place 1 drop into both eyes 2 (two) times daily as needed (for dry eyes).     lisinopril (ZESTRIL) 20 MG tablet Take 1 tablet (20 mg total) by mouth daily. Discontinue lisinopril/hctz 30 tablet 3   loratadine (CLARITIN) 10 MG tablet Take 10 mg by mouth daily as needed for allergies.     Magnesium 400 MG TABS Take by mouth. 1 per day     metoprolol tartrate (LOPRESSOR) 25 MG tablet Take 12.5 mg by mouth 2 (two) times daily.     nitroGLYCERIN (NITROSTAT) 0.4 MG SL tablet Place 1 tablet (0.4 mg total) under the tongue every 5 (five) minutes x 3 doses as needed for chest pain. 25 tablet 12   omeprazole (PRILOSEC) 20 MG capsule Take 20 mg by mouth daily.     potassium chloride (K-DUR,KLOR-CON) 10 MEQ tablet Take 10 mEq by mouth daily as needed (for cramping).     rosuvastatin (CRESTOR) 5 MG tablet Take 5 mg by mouth at bedtime.      vitamin B-12 (CYANOCOBALAMIN) 1000 MCG tablet Take 2,000 mcg by mouth daily.      No current facility-administered medications for this encounter.    Allergies  Allergen Reactions   Lipitor [Atorvastatin] Diarrhea   Plavix [Clopidogrel Bisulfate]     Itching    Pravastatin Diarrhea    Social History   Socioeconomic History   Marital status: Married    Spouse name: Not on file   Number of children: Not on file   Years of education: Not on file   Highest education level: Not on file  Occupational  History   Occupation: Retired, but works 2 days/week gas station  Tobacco Use   Smoking status: Former    Types: Cigars    Quit date: 07/30/1999    Years since quitting: 22.6   Smokeless tobacco: Never   Tobacco comments:    Former smoker 03/26/22  Vaping Use   Vaping Use: Never used  Substance and Sexual Activity   Alcohol use: Yes    Alcohol/week: 2.0 standard drinks of alcohol    Types: 2 Cans of beer per week    Comment: 1 beer every 2 weeks or week 03/26/22   Drug use: No   Sexual activity: Not on file  Other Topics Concern   Not on file  Social History Narrative   Lives  in [place], [place] with wife.   Social Determinants of Health   Financial Resource Strain: Not on file  Food Insecurity: Not on file  Transportation Needs: Not on file  Physical Activity: Not on file  Stress: Not on file  Social Connections: Not on file  Intimate Partner Violence: Not on file     ROS- All systems are reviewed and negative except as per the HPI above.  Physical Exam: Vitals:   03/26/22 1136  BP: (!) 126/92  Pulse: 73  Weight: 94.8 kg  Height: 5\' 10"  (1.778 m)    GEN- The patient is a well appearing elderly male, alert and oriented x 3 today.   Head- normocephalic, atraumatic Eyes-  Sclera clear, conjunctiva pink Ears- hearing intact Oropharynx- clear Neck- supple  Lungs- Clear to ausculation bilaterally, normal work of breathing Heart- irregular rate and rhythm, no murmurs, rubs or gallops  GI- soft, NT, ND, + BS Extremities- no clubbing, cyanosis, or edema MS- no significant deformity or atrophy Skin- no rash or lesion Psych- euthymic mood, full affect Neuro- strength and sensation are intact  Wt Readings from Last 3 Encounters:  03/26/22 94.8 kg  02/20/22 93.2 kg  11/02/18 86.6 kg    EKG today demonstrates  Afib Vent. rate 73 BPM PR interval * ms QRS duration 88 ms QT/QTcB 374/412 ms  Echo 08/25/18 demonstrated  Left ventricle: Mild basal septal  hypertrophy. Systolic function    was normal. The estimated ejection fraction was in the range of    55% to 60%. Doppler parameters are consistent with abnormal left    ventricular relaxation (grade 1 diastolic dysfunction).  - Regional wall motion abnormality: Mild hypokinesis of the basal    inferior myocardium.   Impressions:   - LVEF has normalized since prior study in 2016.   Epic records are reviewed at length today  CHA2DS2-VASc Score = 5  The patient's score is based upon: CHF History: 0 HTN History: 1 Diabetes History: 1 Stroke History: 0 Vascular Disease History: 1 Age Score: 2 Gender Score: 0       ASSESSMENT AND PLAN: 1. Persistent Atrial Fibrillation (ICD10:  I48.19) The patient's CHA2DS2-VASc score is 5, indicating a 7.2% annual risk of stroke.   Patient presents for dofetilide admission. Continue Eliquis 5 mg BID, states no missed doses in the last 3 weeks. No recent benadryl use PharmD has screened medications. HCTZ stopped, on lisinopril alone.  QTc in SR 391 ms Labs today show creatinine at 0.98, K+ 3.9 and mag 1.5, CrCl calculated at 86 mL/min. Magnesium will need to be repleated before initiating dofetilide.  Continue Lopressor 12.5 mg BID  2. Secondary Hypercoagulable State (ICD10:  D68.69) The patient is at significant risk for stroke/thromboembolism based upon his CHA2DS2-VASc Score of 5.  Continue Apixaban (Eliquis).   3. Obstructive sleep apnea The importance of adequate treatment of sleep apnea was discussed today in order to improve our ability to maintain sinus rhythm long term.  4. CAD S/p multiple DES No anginal symptoms.  5. HTN Stable, no changes today.   To be admitted later today once a bed becomes available.    2017 PA-C Afib Clinic [facility] [address] [place], [place] [id number] 03/26/2022 11:58 AM

## 2022-03-26 NOTE — Progress Notes (Signed)
Pharmacy: Dofetilide (Tikosyn) - Initial Consult Assessment and Electrolyte Replacement  Pharmacy consulted to assist in monitoring and replacing electrolytes in this 76 y.o. male admitted on 03/26/2022 undergoing dofetilide initiation.   Assessment:  Patient Exclusion Criteria: If any screening criteria checked as "Yes", then  patient  should NOT receive dofetilide until criteria item is corrected.  If "Yes" please indicate correction plan.  YES  NO Patient  Exclusion Criteria Correction Plan   []   [x]   Baseline QTc interval is greater than or equal to 440 msec. IF above YES box checked dofetilide contraindicated unless patient has ICD; then may proceed if QTc 500-550 msec or with known ventricular conduction abnormalities may proceed with QTc 550-600 msec. QTc =  412    []   [x]   Patient is known or suspected to have a digoxin level greater than 2 ng/ml: No results found for: "DIGOXIN"     []   [x]   Creatinine clearance less than 20 ml/min (calculated using Cockcroft-Gault, actual body weight and serum creatinine): Estimated Creatinine Clearance: 74.1 mL/min (by C-G formula based on SCr of 0.98 mg/dL).     []   [x]  Patient has received drugs known to prolong the QT intervals within the last 48 hours (phenothiazines, tricyclics or tetracyclic antidepressants, erythromycin, H-1 antihistamines, cisapride, fluoroquinolones, azithromycin, ondansetron).   Updated information on QT prolonging agents is available to be searched on the following database:QT prolonging agents     []   [x]   Patient received a dose of hydrochlorothiazide (Oretic) alone or in any combination including triamterene (Dyazide, Maxzide) in the last 48 hours.    []   [x]  Patient received a medication known to increase dofetilide plasma concentrations prior to initial dofetilide dose:  Trimethoprim (Primsol, Proloprim) in the last 36 hours Verapamil (Calan, Verelan) in the last 36 hours or a sustained release dose  in the last 72 hours Megestrol (Megace) in the last 5 days  Cimetidine (Tagamet) in the last 6 hours Ketoconazole (Nizoral) in the last 24 hours Itraconazole (Sporanox) in the last 48 hours  Prochlorperazine (Compazine) in the last 36 hours     []   [x]   Patient is known to have a history of torsades de pointes; congenital or acquired long QT syndromes.    []   [x]   Patient has received a Class 1 antiarrhythmic with less than 2 half-lives since last dose. (Disopyramide, Quinidine, Procainamide, Lidocaine, Mexiletine, Flecainide, Propafenone)    []   [x]   Patient has received amiodarone therapy in the past 3 months or amiodarone level is greater than 0.3 ng/ml.    Labs:    Component Value Date/Time   K 3.9 03/26/2022 1220   MG 1.5 (L) 03/26/2022 1220     Plan: Select One Calculated CrCl  Dose q12h  [x]  > 60 ml/min 500 mcg  []  40-60 ml/min 250 mcg  []  20-40 ml/min 125 mcg   []   Physician selected initial dose within range recommended for patients level of renal function - will monitor for response.  []   Physician selected initial dose outside of range recommended for patients level of renal function - will discuss if the dose should be altered at this time.   Patient has been appropriately anticoagulated with apixban.  Potassium: K 3.8-3.9:  Hold Tikosyn initiation and give KCl 40 mEq po x1 then begin Tikosyn at least 2hr after KCl dose - do not need to recheck K   Magnesium: Mg 1.3-1.7: Hold Tikosyn initiation and give Mg 4 gm x1 IV, recheck Mg 4hr after  end of infusion - repeat appropriate dose if not > 1.8     Thank you for allowing pharmacy to participate in this patient's care   Harland German, PharmD Clinical Pharmacist **Pharmacist phone directory can now be found on [email or web address] ([email or web address]).  Listed under [facility].

## 2022-03-27 DIAGNOSIS — I4819 Other persistent atrial fibrillation: Secondary | ICD-10-CM | POA: Diagnosis not present

## 2022-03-27 LAB — BASIC METABOLIC PANEL
Anion gap: 8 (ref 5–15)
BUN: 14 mg/dL (ref 8–23)
CO2: 25 mmol/L (ref 22–32)
Calcium: 8.7 mg/dL — ABNORMAL LOW (ref 8.9–10.3)
Chloride: 105 mmol/L (ref 98–111)
Creatinine, Ser: 0.87 mg/dL (ref 0.61–1.24)
GFR, Estimated: >60 mL/min (ref >60)
Glucose, Bld: 126 mg/dL — ABNORMAL HIGH (ref 70–99)
Potassium: 4.3 mmol/L (ref 3.5–5.1)
Sodium: 138 mmol/L (ref 135–145)

## 2022-03-27 LAB — GLUCOSE, CAPILLARY
Glucose-Capillary: 133 mg/dL — ABNORMAL HIGH (ref 70–99)
Glucose-Capillary: 100 mg/dL — ABNORMAL HIGH (ref 70–99)
Glucose-Capillary: 145 mg/dL — ABNORMAL HIGH (ref 70–99)
Glucose-Capillary: 90 mg/dL (ref 70–99)

## 2022-03-27 LAB — MAGNESIUM: Magnesium: 2.1 mg/dL (ref 1.7–2.4)

## 2022-03-27 MED ORDER — INSULIN ASPART 100 UNIT/ML IJ SOLN: 0.0000 [IU] | Freq: Every day | INTRAMUSCULAR | Status: DC

## 2022-03-27 MED ORDER — METOPROLOL TARTRATE 25 MG PO TABS
25.0000 mg | ORAL_TABLET | Freq: Two times a day (BID) | ORAL | Status: DC
  Administered 2022-03-27 – 2022-03-29 (×4): 25 mg via ORAL
  Filled 2022-03-27 (×4): qty 1

## 2022-03-27 MED ORDER — INSULIN ASPART 100 UNIT/ML IJ SOLN
0.0000 [IU] | Freq: Three times a day (TID) | INTRAMUSCULAR | Status: DC
  Administered 2022-03-27: 2 [IU] via SUBCUTANEOUS

## 2022-03-27 MED ORDER — ISOSORBIDE MONONITRATE ER 60 MG PO TB24
60.0000 mg | ORAL_TABLET | Freq: Every day | ORAL | Status: DC
  Administered 2022-03-28 – 2022-03-29 (×2): 60 mg via ORAL
  Filled 2022-03-27 (×2): qty 1

## 2022-03-27 MED ORDER — INSULIN ASPART 100 UNIT/ML IJ SOLN
20.0000 [IU] | Freq: Every day | INTRAMUSCULAR | Status: DC
  Administered 2022-03-28 – 2022-03-29 (×2): 20 [IU] via SUBCUTANEOUS

## 2022-03-27 MED ORDER — ISOSORBIDE MONONITRATE ER 60 MG PO TB24: 60.0000 mg | ORAL_TABLET | Freq: Every day | ORAL | Status: DC

## 2022-03-27 NOTE — Progress Notes (Addendum)
Rounding Note    Patient Name: Gary Wheeler Date of Encounter: 03/27/2022  [facility] Cardiologist: Dina Rich, MD   Subjective   Feels well  Inpatient Medications    Scheduled Meds:  amLODipine  5 mg Oral Daily   apixaban  5 mg Oral BID   aspirin EC  81 mg Oral Daily   cyanocobalamin  2,000 mcg Oral Daily   dofetilide  500 mcg Oral BID   insulin aspart  20 Units Subcutaneous BID AC & HS   insulin glargine-yfgn  30 Units Subcutaneous QHS   isosorbide mononitrate  120 mg Oral Daily   lisinopril  20 mg Oral Daily   magnesium oxide  400 mg Oral Daily   metFORMIN  500 mg Oral BID WC   metoprolol tartrate  12.5 mg Oral BID   pantoprazole  40 mg Oral Daily   rosuvastatin  5 mg Oral QHS   sodium chloride flush  3 mL Intravenous Q12H   Continuous Infusions:  sodium chloride     PRN Meds: sodium chloride, acetaminophen, ketotifen, loratadine, nitroGLYCERIN, potassium chloride, sodium chloride flush   Vital Signs    Vitals:   03/26/22 1819 03/26/22 2019 03/27/22 0551 03/27/22 0815  BP: (!) 125/93 (!) 144/99 114/83 120/87  Pulse: 76 98 66 64  Resp: 18 19 17 18   Temp: 97.8 F (36.6 C) 97.6 F (36.4 C) 97.6 F (36.4 C)   TempSrc: Oral Oral Oral   SpO2: 98% 98% 98%   Weight:      Height:        Intake/Output Summary (Last 24 hours) at 03/27/2022 0851 Last data filed at 03/26/2022 2218 Gross per 24 hour  Intake 319.27 ml  Output --  Net 319.27 ml      03/26/2022    4:35 PM 03/26/2022   11:36 AM 02/20/2022   11:30 AM  Last 3 Weights  Weight (lbs) 208 lb 15.9 oz 209 lb 205 lb 6.4 oz  Weight (kg) 94.8 kg 94.802 kg 93.169 kg      Telemetry    AFib > SR  60's - Personally Reviewed  ECG    AFib 93bpm, QTc 474 - Personally Reviewed>> sinus with QTc of 458 msec   Physical Exam   GEN: No acute distress.   Neck: No JVD Cardiac: RRR, no murmurs, rubs, or gallops.  Respiratory: CTA b/l. GI: Soft, nontender, non-distended  MS: No edema; No  deformity. Neuro:  Nonfocal  Psych: Normal affect   Labs    High Sensitivity Troponin:  No results for input(s): "TROPONINIHS" in the last 720 hours.   Chemistry Recent Labs  Lab 03/26/22 1220 03/26/22 1849 03/27/22 0710  NA 138  --  138  K 3.9  --  4.3  CL 106  --  105  CO2 29  --  25  GLUCOSE 66*  --  126*  BUN 14  --  14  CREATININE 0.98  --  0.87  CALCIUM 9.0  --  8.7*  MG 1.5* 2.5* 2.1  GFRNONAA >60  --  >60  ANIONGAP 3*  --  8    Lipids No results for input(s): "CHOL", "TRIG", "HDL", "LABVLDL", "LDLCALC", "CHOLHDL" in the last 168 hours.  HematologyNo results for input(s): "WBC", "RBC", "HGB", "HCT", "MCV", "MCH", "MCHC", "RDW", "PLT" in the last 168 hours. Thyroid No results for input(s): "TSH", "FREET4" in the last 168 hours.  BNPNo results for input(s): "BNP", "PROBNP" in the last 168 hours.  DDimer  No results for input(s): "DDIMER" in the last 168 hours.   Radiology    No results found.  Cardiac Studies   DATE TEST EF    6/16 Echo   45-50 %    1/20 Echo  55-60%    2/20 LHC  35-45 % (poor quality) LAD prior stents patent,  LADm 95%>>stent    Patient Profile     76 y.o. male w/PMHx of CAD, DM, HTN, HLD, and AFib admitted for tikosyn  Assessment & Plan    Persistent AFib CHA2DS2Vasc is 4, on Eliquis, appropriately dosed Tikosyn load is in progress K+ 4.3 Mag 2.1 Creat 0.87, stable QTc is stable  Converted with drug  CAD Home meds Wife will bring home meds to clarify doses  HTN Looks good Home meds  HLD Home meds  DM Home regime  For questions or updates, please contact [facility] Please consult [email or web address] for contact info under        Signed, Sheilah Pigeon, PA-C  03/27/2022, 8:51 AM    Afib persistent  CAD with prior PCI  HTN  HFpEF  Admitted for dofetilide initiation for symptomatic atrial fibrillation-persistent.  He has converted overnight and QT remains in range.  Anticipate 48 hrs more observation  and discharge on Friday

## 2022-03-27 NOTE — Progress Notes (Signed)
Pharmacy: Dofetilide (Tikosyn) - Follow Up Assessment and Electrolyte Replacement  Pharmacy consulted to assist in monitoring and replacing electrolytes in this 76 y.o. male admitted on 03/26/2022 undergoing dofetilide initiation.   Labs:    Component Value Date/Time   K 4.3 03/27/2022 0710   MG 2.1 03/27/2022 0710     Plan: Potassium: K >/= 4: No additional supplementation needed  Magnesium: Mg > 2: No additional supplementation needed   Thank you for allowing pharmacy to participate in this patient's care   Harland German, PharmD Clinical Pharmacist **Pharmacist phone directory can now be found on [email or web address] ([email or web address]).  Listed under [facility].

## 2022-03-27 NOTE — Progress Notes (Signed)
Cross Cover Note   Notified by nursing glucose 153.  Instructed them to hold evening aspart 20 units.   Achille Rich, MD

## 2022-03-28 DIAGNOSIS — I4819 Other persistent atrial fibrillation: Principal | ICD-10-CM

## 2022-03-28 LAB — BASIC METABOLIC PANEL
Anion gap: 5 (ref 5–15)
BUN: 16 mg/dL (ref 8–23)
CO2: 28 mmol/L (ref 22–32)
Calcium: 8.6 mg/dL — ABNORMAL LOW (ref 8.9–10.3)
Chloride: 106 mmol/L (ref 98–111)
Creatinine, Ser: 0.93 mg/dL (ref 0.61–1.24)
GFR, Estimated: >60 mL/min (ref >60)
Glucose, Bld: 122 mg/dL — ABNORMAL HIGH (ref 70–99)
Potassium: 4.1 mmol/L (ref 3.5–5.1)
Sodium: 139 mmol/L (ref 135–145)

## 2022-03-28 LAB — HEMOGLOBIN A1C
Hgb A1c MFr Bld: 5.6 % (ref 4.8–5.6)
Mean Plasma Glucose: 114.02 mg/dL

## 2022-03-28 LAB — GLUCOSE, CAPILLARY
Glucose-Capillary: 160 mg/dL — ABNORMAL HIGH (ref 70–99)
Glucose-Capillary: 106 mg/dL — ABNORMAL HIGH (ref 70–99)
Glucose-Capillary: 87 mg/dL (ref 70–99)
Glucose-Capillary: 140 mg/dL — ABNORMAL HIGH (ref 70–99)

## 2022-03-28 LAB — MAGNESIUM: Magnesium: 1.9 mg/dL (ref 1.7–2.4)

## 2022-03-28 MED ORDER — POTASSIUM CHLORIDE CRYS ER 10 MEQ PO TBCR
10.0000 meq | EXTENDED_RELEASE_TABLET | Freq: Every day | ORAL | Status: DC
  Administered 2022-03-28 – 2022-03-29 (×2): 10 meq via ORAL
  Filled 2022-03-28 (×2): qty 1

## 2022-03-28 MED ORDER — MAGNESIUM SULFATE 2 GM/50ML IV SOLN
2.0000 g | Freq: Once | INTRAVENOUS | Status: AC
  Administered 2022-03-28: 2 g via INTRAVENOUS
  Filled 2022-03-28: qty 50

## 2022-03-28 NOTE — Care Management (Addendum)
03-28-22 1429 Case Manager did call the [facility] Fees Coordinator to make them aware of hospitalization. Case Manager did speak with the Fees Coordinator and patient is a member of the [facility] [place]- PCP Willa Rough CSW is Marja Kays for discharge planning needs and she can be reached at [phone] ext [phone].  1659 03-28-22 Case Manager spoke with patient regarding cost for Tikosyn. Patient states he gets all medications free from the [facility]. Case Manager attempted to call the pharmacy @ [phone] ext [phone], ext [phone]- no answer. Case Manager did discuss [facility] for the initial Rx and the cost of $30.00-35.00. [facility], since his cost is zero. T[facility] could deliver and bill the patient and he submit the cost to the [place] or the initial Rx can be sent to the [place] to see if they can fill for the patient. [facility] may be of assistance as well. Rx refills to be sent to [facility]. Covering Case Manager to discuss with the floor pharmacy for further assistance in getting initial Tikosyn filled.

## 2022-03-28 NOTE — Progress Notes (Addendum)
Rounding Note    Patient Name: Gary Wheeler Date of Encounter: 03/28/2022  [facility] Cardiologist: Dina Rich, MD   Subjective   Feels well, he can tell he is in normal rhythm, feels better  Inpatient Medications    Scheduled Meds:  amLODipine  5 mg Oral Daily   apixaban  5 mg Oral BID   aspirin EC  81 mg Oral Daily   cyanocobalamin  2,000 mcg Oral Daily   dofetilide  500 mcg Oral BID   insulin aspart  0-15 Units Subcutaneous TID WC   insulin aspart  0-5 Units Subcutaneous QHS   insulin aspart  20 Units Subcutaneous QAC breakfast   insulin glargine-yfgn  30 Units Subcutaneous QHS   isosorbide mononitrate  60 mg Oral Daily   lisinopril  20 mg Oral Daily   magnesium oxide  400 mg Oral Daily   metFORMIN  500 mg Oral BID WC   metoprolol tartrate  25 mg Oral BID   pantoprazole  40 mg Oral Daily   rosuvastatin  5 mg Oral QHS   sodium chloride flush  3 mL Intravenous Q12H   Continuous Infusions:  sodium chloride     PRN Meds: sodium chloride, acetaminophen, ketotifen, loratadine, nitroGLYCERIN, potassium chloride, sodium chloride flush   Vital Signs    Vitals:   03/27/22 0815 03/27/22 1334 03/27/22 2004 03/28/22 0315  BP: 120/87 103/76 115/81 107/69  Pulse: 64   64  Resp: 18  18 19   Temp:  97.7 F (36.5 C) 97.8 F (36.6 C) 98.7 F (37.1 C)  TempSrc:  Oral Oral Oral  SpO2:    92%  Weight:      Height:        Intake/Output Summary (Last 24 hours) at 03/28/2022 03/30/2022 Last data filed at 03/27/2022 2000 Gross per 24 hour  Intake 240 ml  Output --  Net 240 ml      03/26/2022    4:35 PM 03/26/2022   11:36 AM 02/20/2022   11:30 AM  Last 3 Weights  Weight (lbs) 208 lb 15.9 oz 209 lb 205 lb 6.4 oz  Weight (kg) 94.8 kg 94.802 kg 93.169 kg      Telemetry    SR 50's-60's - Personally Reviewed  ECG    SR 62bpm, QT c 02/22/2022 - Personally Reviewed>> sinus with QTc of 458 msec   Physical Exam   Unchanged exam today GEN: No acute distress.    Neck: No JVD Cardiac: RRR, no murmurs, rubs, or gallops.  Respiratory: CTA b/l. GI: Soft, nontender, non-distended  MS: No edema; No deformity. Neuro:  Nonfocal  Psych: Normal affect   Labs    High Sensitivity Troponin:  No results for input(s): "TROPONINIHS" in the last 720 hours.   Chemistry Recent Labs  Lab 03/26/22 1220 03/26/22 1849 03/27/22 0710 03/28/22 0307  NA 138  --  138 139  K 3.9  --  4.3 4.1  CL 106  --  105 106  CO2 29  --  25 28  GLUCOSE 66*  --  126* 122*  BUN 14  --  14 16  CREATININE 0.98  --  0.87 0.93  CALCIUM 9.0  --  8.7* 8.6*  MG 1.5* 2.5* 2.1 1.9  GFRNONAA >60  --  >60 >60  ANIONGAP 3*  --  8 5    Lipids No results for input(s): "CHOL", "TRIG", "HDL", "LABVLDL", "LDLCALC", "CHOLHDL" in the last 168 hours.  HematologyNo results for input(s): "WBC", "  RBC", "HGB", "HCT", "MCV", "MCH", "MCHC", "RDW", "PLT" in the last 168 hours. Thyroid No results for input(s): "TSH", "FREET4" in the last 168 hours.  BNPNo results for input(s): "BNP", "PROBNP" in the last 168 hours.  DDimer No results for input(s): "DDIMER" in the last 168 hours.   Radiology    No results found.  Cardiac Studies   DATE TEST EF    6/16 Echo   45-50 %    1/20 Echo  55-60%    2/20 LHC  35-45 % (poor quality) LAD prior stents patent,  LADm 95%>>stent    Patient Profile     76 y.o. male w/PMHx of CAD, DM, HTN, HLD, and AFib admitted for tikosyn  Assessment & Plan    Persistent AFib CHA2DS2Vasc is 4, on Eliquis, appropriately dosed Tikosyn load is in progress K+ 4.1 Mag 1.9 Creat 0.93, stable QTc is stable  Converted with drug Anticipate discharge tomorrow   CAD Home meds Wife will bring home meds to clarify doses   HTN Looks good Home meds  HLD Home meds  DM Home regime  For questions or updates, please contact [facility] Please consult [email or web address] for contact info under   Signed, Sheilah Pigeon, PA-C  03/28/2022, 7:22 AM    EP  Attending  Patient seen and examined. He is maintaining NSR and his QT is acceptable. He will be discharged home tomorrow if QT is stable.  Sharlot Gowda Cashus Halterman,MD

## 2022-03-28 NOTE — Inpatient Diabetes Management (Signed)
Inpatient Diabetes Program Recommendations  AACE/ADA: New Consensus Statement on Inpatient Glycemic Control  Target Ranges:  Prepandial:   less than 140 mg/dL      Peak postprandial:   less than 180 mg/dL (1-2 hours)      Critically ill patients:  140 - 180 mg/dL    Latest Reference Range & Units 03/28/22 08:02  Glucose-Capillary 70 - 99 mg/dL 825 (H)    Latest Reference Range & Units 03/27/22 07:40 03/27/22 14:15 03/27/22 16:51 03/27/22 21:44  Glucose-Capillary 70 - 99 mg/dL 003 (H) 704 (H) 888 (H) 90   Review of Glycemic Control  Diabetes history: DM2 Outpatient Diabetes medications: Lantus 30 units QPM, Novolog 20 units at 8am and 10pm, Metformin 500 mg BID Current orders for Inpatient glycemic control: Semglee 30 units QHS, Novolog 20 units with breakfast, Novolog 0-15 units TID with meals, Novolog 0-5 units QHS, Metformin 500 mg BID  Inpatient Diabetes Program Recommendations:    Inpatient DM medications: Agree with current inpatient DM orders.   NOTE: Noted consult for diabetes coordinator for "patient on insulin".  Patient admitted with persistent atrial fibrillation. CBGs 90-145 mg/dl on 9/16 and fasting CBG 160 mg/dl today. Agree with current orders for DM control. Will follow along.  Thanks, Orlando Penner, RN, MSN, CDCES Diabetes Coordinator Inpatient Diabetes Program [phone] (Team Pager from 8am to 5pm)

## 2022-03-28 NOTE — Progress Notes (Signed)
Pharmacy: Dofetilide (Tikosyn) - Follow Up Assessment and Electrolyte Replacement  Pharmacy consulted to assist in monitoring and replacing electrolytes in this 76 y.o. male admitted on 03/26/2022 undergoing dofetilide initiation. First dofetilide dose: 500 mcg BID. Patient has converted to sinus rhythm.  Labs:    Component Value Date/Time   K 4.1 03/28/2022 0307   MG 1.9 03/28/2022 0307     Plan: Potassium: K = 4.1; will continue PTA dose.  Magnesium: Mg 1.8-2: Give Mg 2 gm IV x1   Prior to admission, patient was on 10 mEq daily of Potassium Chloride. Due to drop in Potassium from 4.3 on 8/30 to 4.1 on 8/31, plan to reinitiate home potassium chloride dose.  Potassium chloride 10 mEq  daily  Thank you for allowing pharmacy to participate in this patient's care   Penny Pia 03/28/2022  7:47 AM

## 2022-03-29 ENCOUNTER — Other Ambulatory Visit (HOSPITAL_COMMUNITY): Payer: Self-pay

## 2022-03-29 DIAGNOSIS — I4819 Other persistent atrial fibrillation: Secondary | ICD-10-CM | POA: Diagnosis not present

## 2022-03-29 LAB — BASIC METABOLIC PANEL
Anion gap: 6 (ref 5–15)
BUN: 14 mg/dL (ref 8–23)
CO2: 28 mmol/L (ref 22–32)
Calcium: 8.6 mg/dL — ABNORMAL LOW (ref 8.9–10.3)
Chloride: 106 mmol/L (ref 98–111)
Creatinine, Ser: 0.96 mg/dL (ref 0.61–1.24)
GFR, Estimated: >60 mL/min (ref >60)
Glucose, Bld: 103 mg/dL — ABNORMAL HIGH (ref 70–99)
Potassium: 4.3 mmol/L (ref 3.5–5.1)
Sodium: 140 mmol/L (ref 135–145)

## 2022-03-29 LAB — MAGNESIUM: Magnesium: 2 mg/dL (ref 1.7–2.4)

## 2022-03-29 LAB — GLUCOSE, CAPILLARY
Glucose-Capillary: 60 mg/dL — ABNORMAL LOW (ref 70–99)
Glucose-Capillary: 81 mg/dL (ref 70–99)

## 2022-03-29 MED ORDER — DOFETILIDE 500 MCG PO CAPS: 500.0000 ug | ORAL_CAPSULE | Freq: Two times a day (BID) | ORAL | 4 refills | Status: AC

## 2022-03-29 MED ORDER — DOFETILIDE 500 MCG PO CAPS
500.0000 ug | ORAL_CAPSULE | Freq: Two times a day (BID) | ORAL | 0 refills | Status: DC
  Filled 2022-03-29 (×2): qty 60, 30d supply, fill #0

## 2022-03-29 NOTE — Discharge Summary (Cosign Needed)
ELECTROPHYSIOLOGY PROCEDURE DISCHARGE SUMMARY    Patient ID: Gary Wheeler,  MRN: [id number], DOB/AGE: 1946-05-18 76 y.o.  Admit date: 03/26/2022 Discharge date: 03/29/2022  Primary Care Physician: [facility]  Primary Cardiologist: Dr. Wyline Mood Electrophysiologist: Dr. Graciela Husbands  Primary Discharge Diagnosis:  1.  persistent atrial fibrillation status post Tikosyn loading this admission      CHA2DS2Vasc is 4, on Eliquis  Secondary Discharge Diagnosis:  CAD HTN HLD DM  Allergies  Allergen Reactions   Lipitor [Atorvastatin] Diarrhea   Plavix [Clopidogrel Bisulfate]     Itching    Pravastatin Diarrhea     Procedures This Admission:  1.  Tikosyn loading    Brief HPI: Gary Wheeler is a 76 y.o. male with a past medical history as noted above.  They were referred to EP in the outpatient setting for treatment options of atrial fibrillation.  Risks, benefits, and alternatives to Tikosyn were reviewed with the patient who wished to proceed.    Hospital Course:  The patient was admitted and Tikosyn was initiated.  Renal function and electrolytes were followed during the hospitalization.  The patient's QTc remained stable.  He converted with drug and did not require DCCV.  He was monitored until discharge on telemetry which demonstrated SR.  On the day of discharge, feels well, was examined by Dr Graciela Husbands who considered the patient stable for discharge to home.  Follow-up has been arranged with the AFib clinic in 1 week and with EP service in 4 weeks.   Tikosyn teaching was completed No new or additional electrolyte replacement for home [facility] pharmacy for 1st fill, he is given a paper rx to bring t the [place] for refills    Physical Exam: Vitals:   03/28/22 0821 03/28/22 1454 03/28/22 2002 03/29/22 0629  BP: 102/69 100/80 122/83 118/84  Pulse: 65 (!) 53 64 75  Resp:  18 20 20   Temp:  97.7 F (36.5 C) 97.9 F (36.6 C) 97.9 F (36.6 C)  TempSrc:  Oral Oral Oral   SpO2:  98% 98% 97%  Weight:      Height:         GEN- The patient is well appearing, alert and oriented x 3 today.   HEENT: normocephalic, atraumatic; sclera clear, conjunctiva pink; hearing intact; oropharynx clear; neck supple, no JVP Lymph- no cervical lymphadenopathy Lungs- CTA b/l, normal work of breathing.  No wheezes, rales, rhonchi Heart- RRR, no murmurs, rubs or gallops, PMI not laterally displaced GI- soft, non-tender, non-distended Extremities- no clubbing, cyanosis, or edema MS- no significant deformity or atrophy Skin- warm and dry, no rash or lesion Psych- euthymic mood, full affect Neuro- strength and sensation are intact   Labs:   Lab Results  Component Value Date   WBC 7.2 09/02/2018   HGB 13.2 09/02/2018   HCT 38.5 (L) 09/02/2018   MCV 83.2 09/02/2018   PLT 200 09/02/2018    Recent Labs  Lab 03/29/22 0527  NA 140  K 4.3  CL 106  CO2 28  BUN 14  CREATININE 0.96  CALCIUM 8.6*  GLUCOSE 103*     Discharge Medications:  Allergies as of 03/29/2022       Reactions   Lipitor [atorvastatin] Diarrhea   Plavix [clopidogrel Bisulfate]    Itching    Pravastatin Diarrhea        Medication List     TAKE these medications    acetaminophen 325 MG tablet Commonly known as: TYLENOL Take 2  tablets (650 mg total) by mouth every 6 (six) hours as needed for mild pain (or Fever >/= 101).   amLODipine 10 MG tablet Commonly known as: NORVASC Take 5 mg by mouth daily.   apixaban 5 MG Tabs tablet Commonly known as: ELIQUIS Take 5 mg by mouth 2 (two) times daily.   aspirin EC 81 MG tablet Take 81 mg by mouth every evening.   Clear Eyes Adv Dry & Itchy Rlf 0.25 % Soln Generic drug: Glycerin Apply 1 drop to eye 3 (three) times daily as needed (dry eyes).   dofetilide 500 MCG capsule Commonly known as: TIKOSYN Take 1 capsule (500 mcg total) by mouth 2 (two) times daily.   insulin glargine 100 UNIT/ML injection Commonly known as: LANTUS Inject 30  Units into the skin every evening.   isosorbide mononitrate 60 MG 24 hr tablet Commonly known as: IMDUR Take 60 mg by mouth daily.   lisinopril 20 MG tablet Commonly known as: ZESTRIL Take 1 tablet (20 mg total) by mouth daily. Discontinue lisinopril/hctz   loratadine 10 MG tablet Commonly known as: CLARITIN Take 10 mg by mouth daily as needed for allergies.   Magnesium 400 MG Tabs Take 1 tablet by mouth every evening. 1 per day   metFORMIN 500 MG tablet Commonly known as: GLUCOPHAGE Take 500 mg by mouth 2 (two) times daily with a meal.   metoprolol tartrate 50 MG tablet Commonly known as: LOPRESSOR Take 25 mg by mouth 2 (two) times daily.   nitroGLYCERIN 0.4 MG SL tablet Commonly known as: NITROSTAT Place 1 tablet (0.4 mg total) under the tongue every 5 (five) minutes x 3 doses as needed for chest pain.   NovoLOG FlexPen 100 UNIT/ML FlexPen Generic drug: insulin aspart Inject 20 Units into the skin 2 (two) times daily at 8 am and 10 pm.   omeprazole 20 MG capsule Commonly known as: PRILOSEC Take 20 mg by mouth daily.   potassium chloride 10 MEQ tablet Commonly known as: KLOR-CON M Take 10 mEq by mouth daily.   rosuvastatin 10 MG tablet Commonly known as: CRESTOR Take 5 mg by mouth every evening.   Vitamin D 50 MCG (2000 UT) tablet Take 4,000 Units by mouth daily.        Disposition: Home Discharge Instructions     Diet - low sodium heart healthy   Complete by: As directed    Increase activity slowly   Complete by: As directed         Duration of Discharge Encounter: Greater than 30 minutes including physician time.  Gary Fredrickson, PA-C 03/29/2022 12:08 PM Patient successfully loaded on dofetilide.  QT intervals within range.  EP Attending  Patient seen and examined. Agree with the findings as above. He has maintained NSR and QT is acceptable. He will be discharged home with followup as outlined above.   Gary Gowda Taylor,MD

## 2022-03-29 NOTE — TOC CM/SW Note (Signed)
Per pharmacy patient has decided to pay the initial cost for Tikosyn through [facility]. Pharmacy changed to [facility] pharmacy

## 2022-03-29 NOTE — Hospital Discharge Follow-Up (Signed)
Discharge patient home PER MD order. Patient given discharge instructions and the patient voiced understanding of discharge instructions// pt wheeled out via w/c. Pt given 30 day supply of tikosyn and prescription to have for his next [place] appointment

## 2022-03-29 NOTE — Plan of Care (Signed)
  Problem: Education: Goal: Knowledge of General Education information will improve Description: Including pain rating scale, medication(s)/side effects and non-pharmacologic comfort measures Outcome: Progressing   Problem: Health Behavior/Discharge Planning: Goal: Ability to manage health-related needs will improve Outcome: Progressing   Problem: Clinical Measurements: Goal: Ability to maintain clinical measurements within normal limits will improve Outcome: Progressing Goal: Will remain free from infection Outcome: Progressing Goal: Diagnostic test results will improve Outcome: Progressing Goal: Respiratory complications will improve Outcome: Progressing Goal: Cardiovascular complication will be avoided Outcome: Progressing   Problem: Activity: Goal: Risk for activity intolerance will decrease Outcome: Progressing   Problem: Nutrition: Goal: Adequate nutrition will be maintained Outcome: Progressing   Problem: Coping: Goal: Level of anxiety will decrease Outcome: Progressing   Problem: Elimination: Goal: Will not experience complications related to bowel motility Outcome: Progressing Goal: Will not experience complications related to urinary retention Outcome: Progressing   Problem: Pain Managment: Goal: General experience of comfort will improve Outcome: Progressing   Problem: Safety: Goal: Ability to remain free from injury will improve Outcome: Progressing   Problem: Skin Integrity: Goal: Risk for impaired skin integrity will decrease Outcome: Progressing   Problem: Education: Goal: Knowledge of disease or condition will improve Outcome: Progressing Goal: Understanding of medication regimen will improve Outcome: Progressing Goal: Individualized Educational Video(s) Outcome: Progressing   Problem: Activity: Goal: Ability to tolerate increased activity will improve Outcome: Progressing   Problem: Cardiac: Goal: Ability to achieve and maintain  adequate cardiopulmonary perfusion will improve Outcome: Progressing   Problem: Health Behavior/Discharge Planning: Goal: Ability to safely manage health-related needs after discharge will improve Outcome: Progressing   Problem: Education: Goal: Ability to describe self-care measures that may prevent or decrease complications (Diabetes Survival Skills Education) will improve Outcome: Progressing Goal: Individualized Educational Video(s) Outcome: Progressing   Problem: Coping: Goal: Ability to adjust to condition or change in health will improve Outcome: Progressing   Problem: Fluid Volume: Goal: Ability to maintain a balanced intake and output will improve Outcome: Progressing   Problem: Health Behavior/Discharge Planning: Goal: Ability to identify and utilize available resources and services will improve Outcome: Progressing Goal: Ability to manage health-related needs will improve Outcome: Progressing   Problem: Metabolic: Goal: Ability to maintain appropriate glucose levels will improve Outcome: Progressing   Problem: Nutritional: Goal: Maintenance of adequate nutrition will improve Outcome: Progressing Goal: Progress toward achieving an optimal weight will improve Outcome: Progressing   Problem: Skin Integrity: Goal: Risk for impaired skin integrity will decrease Outcome: Progressing   Problem: Tissue Perfusion: Goal: Adequacy of tissue perfusion will improve Outcome: Progressing

## 2022-04-03 ENCOUNTER — Encounter (HOSPITAL_COMMUNITY): Payer: Self-pay | Admitting: Physician Assistant

## 2022-04-03 ENCOUNTER — Ambulatory Visit (HOSPITAL_COMMUNITY)
Admit: 2022-04-03 | Discharge: 2022-04-03 | Disposition: A | Discharge status: Home / Self Care | Payer: No Typology Code available for payment source | Attending: Physician Assistant | Admitting: Physician Assistant

## 2022-04-03 VITALS — BP 128/100 | HR 77 | Ht 70.0 in | Wt 206.0 lb

## 2022-04-03 DIAGNOSIS — D6869 Other thrombophilia: Secondary | ICD-10-CM | POA: Diagnosis not present

## 2022-04-03 DIAGNOSIS — I2510 Atherosclerotic heart disease of native coronary artery without angina pectoris: Secondary | ICD-10-CM | POA: Insufficient documentation

## 2022-04-03 DIAGNOSIS — I10 Essential (primary) hypertension: Secondary | ICD-10-CM | POA: Insufficient documentation

## 2022-04-03 DIAGNOSIS — K2270 Barrett's esophagus without dysplasia: Secondary | ICD-10-CM | POA: Diagnosis not present

## 2022-04-03 DIAGNOSIS — I4819 Other persistent atrial fibrillation: Secondary | ICD-10-CM | POA: Diagnosis present

## 2022-04-03 DIAGNOSIS — E785 Hyperlipidemia, unspecified: Secondary | ICD-10-CM | POA: Diagnosis not present

## 2022-04-03 DIAGNOSIS — E119 Type 2 diabetes mellitus without complications: Secondary | ICD-10-CM | POA: Diagnosis not present

## 2022-04-03 DIAGNOSIS — Z7901 Long term (current) use of anticoagulants: Secondary | ICD-10-CM | POA: Diagnosis not present

## 2022-04-03 DIAGNOSIS — G4733 Obstructive sleep apnea (adult) (pediatric): Secondary | ICD-10-CM | POA: Insufficient documentation

## 2022-04-03 LAB — BASIC METABOLIC PANEL
Anion gap: 6 (ref 5–15)
BUN: 13 mg/dL (ref 8–23)
CO2: 28 mmol/L (ref 22–32)
Calcium: 9.2 mg/dL (ref 8.9–10.3)
Chloride: 105 mmol/L (ref 98–111)
Creatinine, Ser: 0.9 mg/dL (ref 0.61–1.24)
GFR, Estimated: >60 mL/min (ref >60)
Glucose, Bld: 68 mg/dL — ABNORMAL LOW (ref 70–99)
Potassium: 4.2 mmol/L (ref 3.5–5.1)
Sodium: 139 mmol/L (ref 135–145)

## 2022-04-03 LAB — MAGNESIUM: Magnesium: 2 mg/dL (ref 1.7–2.4)

## 2022-04-03 NOTE — Progress Notes (Signed)
Primary Care Physician: [facility] Primary Cardiologist: Dr Elon [facility] Primary Electrophysiologist: Dr Graciela Husbands Referring Physician: Dr Woody Seller is a 76 y.o. male with a history of CAD, HLD, HTN, OSA, DM, Barrett's esophagus, atrial fibrillation who presents for follow up in the [facility].  The patient was initially diagnosed with atrial fibrillation at a routine cardiology visit at the [place]. He was started on Eliquis for a CHADS2VASC score of 5 and underwent DCCV. Unfortunately, he reverted back to afib after a couple of weeks. He was seen by Dr Graciela Husbands 02/20/22 who recommended dofetilide.  On follow up today, patient is s/p dofetilide loading 8/29-03/29/22. He converted with the medication and did not require DCCV. He reports that he felt well up until this morning when he noticed some generalized weakness. He is in rate controlled afib today. There were no specific triggers he can identify. He is still waiting for his CPAP machine to arrive.   Today, he denies symptoms of palpitations, chest pain, shortness of breath, orthopnea, PND, lower extremity edema, dizziness, presyncope, syncope, bleeding, or neurologic sequela. The patient is tolerating medications without difficulties and is otherwise without complaint today.    Atrial Fibrillation Risk Factors:  he does have symptoms or diagnosis of sleep apnea. he does not have a history of rheumatic fever.   he has a BMI of Body mass index is 29.56 kg/m.Marland Kitchen Filed Weights   04/03/22 0906  Weight: 93.4 kg    Family History  Problem Relation Age of Onset   Coronary artery disease Father    Coronary artery disease Paternal Grandfather    Prostate cancer Brother    Colon cancer Neg Hx    Colon polyps Neg Hx      Atrial Fibrillation Management history:  Previous antiarrhythmic drugs: dofetilide  Previous cardioversions: 2023 at the [place] Previous ablations:  none CHADS2VASC score: 5 Anticoagulation history: Eliquis   Past Medical History:  Diagnosis Date   Barrett's esophagus 2008   CAD (coronary artery disease) 01/09/15   a. cath 01/09/15 PCI with 2 DES stents in LAD/1 in Dx with comcomitant CAD in distal LAD, RI.    Complication of anesthesia    SLOW TO WAKE    Diabetes mellitus without complication (HCC)    Hypercholesterolemia    Hypertension    STEMI (ST elevation myocardial infarction) (HCC) 01/09/15   Past Surgical History:  Procedure Laterality Date   BIOPSY  03/18/2018   Procedure: BIOPSY;  Surgeon: Corbin Ade, MD;  Location[facility];  Service: Endoscopy;;  esophageal biopsies   CARDIAC CATHETERIZATION N/A 01/09/2015   Procedure: Left Heart Cath and Coronary Angiography;  Surgeon: Marykay Lex, MD;  Location: [facility] INVASIVE CV LAB;  Service: Cardiovascular;  Laterality: N/A;   CARDIAC CATHETERIZATION  01/09/2015   with 3 stents   COLONOSCOPY  2008   Dr. Linna Darner: normal   COLONOSCOPY N/A 03/18/2018   diverticulosis in sigmoid and descending   CORONARY STENT INTERVENTION N/A 09/01/2018   Procedure: CORONARY STENT INTERVENTION;  Surgeon: Marykay Lex, MD;  Location: [facility] INVASIVE CV LAB;  Service: Cardiovascular;  Laterality: N/A;  Lad, RCA   ESOPHAGOGASTRODUODENOSCOPY  2008   Dr. Linna Darner: Barrett's. Path with a minute fragment of squamous cell epithelium suggestive of dysplasia   ESOPHAGOGASTRODUODENOSCOPY N/A 03/18/2018   DR. Rourk: Barrett's esophagus, small hiatal hernia, minimal polypoid gastric mucosa,normal duodenum, surveillance in 3 years   LEFT HEART CATH AND CORONARY  ANGIOGRAPHY N/A 09/01/2018   Procedure: LEFT HEART CATH AND CORONARY ANGIOGRAPHY;  Surgeon: Marykay Lex, MD;  Location: [facility] INVASIVE CV LAB;  Service: Cardiovascular;  Laterality: N/A;   left ORIF ankle      Current Outpatient Medications  Medication Sig Dispense Refill   acetaminophen (TYLENOL) 325 MG tablet Take 2 tablets (650 mg total) by mouth  every 6 (six) hours as needed for mild pain (or Fever >/= 101).     amLODipine (NORVASC) 10 MG tablet Take 5 mg by mouth daily.      apixaban (ELIQUIS) 5 MG TABS tablet Take 5 mg by mouth 2 (two) times daily.     aspirin EC 81 MG tablet Take 81 mg by mouth every evening.     Cholecalciferol (VITAMIN D) 50 MCG (2000 UT) tablet Take 4,000 Units by mouth daily.     dofetilide (TIKOSYN) 500 MCG capsule Take 1 capsule (500 mcg total) by mouth 2 (two) times daily. 60 capsule 4   Glycerin (CLEAR EYES ADV DRY & ITCHY RLF) 0.25 % SOLN Apply 1 drop to eye 3 (three) times daily as needed (dry eyes).     insulin aspart (NOVOLOG FLEXPEN) 100 UNIT/ML FlexPen Inject 20 Units into the skin 2 (two) times daily at 8 am and 10 pm.     insulin glargine (LANTUS) 100 UNIT/ML injection Inject 30 Units into the skin every evening.     isosorbide mononitrate (IMDUR) 60 MG 24 hr tablet Take 60 mg by mouth daily.     lisinopril (ZESTRIL) 20 MG tablet Take 1 tablet (20 mg total) by mouth daily. Discontinue lisinopril/hctz 30 tablet 3   loratadine (CLARITIN) 10 MG tablet Take 10 mg by mouth daily as needed for allergies.     Magnesium 400 MG TABS Take 1 tablet by mouth every evening. 1 per day     metFORMIN (GLUCOPHAGE) 500 MG tablet Take 500 mg by mouth 2 (two) times daily with a meal.     metoprolol tartrate (LOPRESSOR) 50 MG tablet Take 25 mg by mouth 2 (two) times daily.     nitroGLYCERIN (NITROSTAT) 0.4 MG SL tablet Place 1 tablet (0.4 mg total) under the tongue every 5 (five) minutes x 3 doses as needed for chest pain. 25 tablet 12   omeprazole (PRILOSEC) 20 MG capsule Take 20 mg by mouth daily.     potassium chloride (K-DUR,KLOR-CON) 10 MEQ tablet Take 10 mEq by mouth daily.     rosuvastatin (CRESTOR) 10 MG tablet Take 5 mg by mouth every evening.     No current facility-administered medications for this encounter.    Allergies  Allergen Reactions   Lipitor [Atorvastatin] Diarrhea   Plavix [Clopidogrel  Bisulfate]     Itching    Pravastatin Diarrhea    Social History   Socioeconomic History   Marital status: Married    Spouse name: Not on file   Number of children: Not on file   Years of education: Not on file   Highest education level: Not on file  Occupational History   Occupation: Retired, but works 2 days/week gas station  Tobacco Use   Smoking status: Former    Types: Cigars    Quit date: 07/30/1999    Years since quitting: 22.6   Smokeless tobacco: Never   Tobacco comments:    Former smoker 03/26/22  Vaping Use   Vaping Use: Never used  Substance and Sexual Activity   Alcohol use: Yes    Alcohol/week: 2.0 standard  drinks of alcohol    Types: 2 Cans of beer per week    Comment: 1 beer every 2 weeks or week 03/26/22   Drug use: No   Sexual activity: Not on file  Other Topics Concern   Not on file  Social History Narrative   Lives in [place], [place] with wife.   Social Determinants of Health   Financial Resource Strain: Not on file  Food Insecurity: Not on file  Transportation Needs: Not on file  Physical Activity: Not on file  Stress: Not on file  Social Connections: Not on file  Intimate Partner Violence: Not on file     ROS- All systems are reviewed and negative except as per the HPI above.  Physical Exam: Vitals:   04/03/22 0906  BP: (!) 128/100  Pulse: 77  Weight: 93.4 kg  Height: 5\' 10"  (1.778 m)     GEN- The patient is a well appearing elderly male, alert and oriented x 3 today.   HEENT-head normocephalic, atraumatic, sclera clear, conjunctiva pink, hearing intact, trachea midline. Lungs- Clear to ausculation bilaterally, normal work of breathing Heart- irregular rate and rhythm, no murmurs, rubs or gallops  GI- soft, NT, ND, + BS Extremities- no clubbing, cyanosis, or edema MS- no significant deformity or atrophy Skin- no rash or lesion Psych- euthymic mood, full affect Neuro- strength and sensation are intact   Wt Readings from Last 3  Encounters:  04/03/22 93.4 kg  03/26/22 94.8 kg  03/26/22 94.8 kg    EKG today demonstrates  Afib Vent. rate 77 BPM PR interval * ms QRS duration 90 ms QT/QTcB 408/461 ms  Echo 08/25/18 demonstrated  Left ventricle: Mild basal septal hypertrophy. Systolic function    was normal. The estimated ejection fraction was in the range of    55% to 60%. Doppler parameters are consistent with abnormal left    ventricular relaxation (grade 1 diastolic dysfunction).  - Regional wall motion abnormality: Mild hypokinesis of the basal    inferior myocardium.   Impressions:   - LVEF has normalized since prior study in 2016.   Epic records are reviewed at length today  CHA2DS2-VASc Score = 5  The patient's score is based upon: CHF History: 0 HTN History: 1 Diabetes History: 1 Stroke History: 0 Vascular Disease History: 1 Age Score: 2 Gender Score: 0       ASSESSMENT AND PLAN: 1. Persistent Atrial Fibrillation (ICD10:  I48.19) The patient's CHA2DS2-VASc score is 5, indicating a 7.2% annual risk of stroke.   S/p dofetilide admission 8/29-03/29/22. Patient back in rate controlled afib today. We discussed rhythm control options. It is possible that he just went into afib this morning given the onset of his symptoms. He converted with the drug in the hospital. Hopefully, he will convert quickly again on his own. If he remains persistent, will consider DCCV. We discussed smart devices for home monitoring.  Continue dofetilide 500 mcg BID Check bmet/mag today. Continue Eliquis 5 mg BID Continue Lopressor 25 mg BID  2. Secondary Hypercoagulable State (ICD10:  D68.69) The patient is at significant risk for stroke/thromboembolism based upon his CHA2DS2-VASc Score of 5.  Continue Apixaban (Eliquis).   3. Obstructive sleep apnea Patient waiting on CPAP to arrive.  Getting it through the [place].  4. CAD S/p multiple DES No anginal symptoms.  5. HTN Diastolic elevated today, was controlled  while inpatient. Will reassess on follow up.    Follow up in the AF clinic in one month.  Jorja Loa PA-C Afib [facility] [address] [place], [place] [address] [phone] 04/03/2022 9:30 AM

## 2022-04-08 NOTE — H&P (Signed)
Primary Care Physician: [facility] Primary Cardiologist: Dr Roger Shelter [place] Primary Electrophysiologist: Dr Caryl Comes Referring Physician: Dr Marella Chimes is a 76 y.o. male with a history of CAD, HLD, HTN, OSA, DM, Barrett's esophagus, atrial fibrillation who presents for follow up in the [facility].  The patient was initially diagnosed with atrial fibrillation at a routine cardiology visit at the [place]. He was started on Eliquis for a CHADS2VASC score of 5 and underwent DCCV. Unfortunately, he reverted back to afib after a couple of weeks. He was seen by Dr Caryl Comes 02/20/22 who recommended dofetilide.   On follow up today, patient presents for dofetilide loading. He remains in rate controlled afib today. He denies any missed doses of anticoagulation in the past 3 weeks. He stopped HCTZ 4 days ago.    Today, he denies symptoms of palpitations, chest pain, shortness of breath, orthopnea, PND, lower extremity edema, dizziness, presyncope, syncope, snoring, daytime somnolence, bleeding, or neurologic sequela. The patient is tolerating medications without difficulties and is otherwise without complaint today.      Atrial Fibrillation Risk Factors:   he does have symptoms or diagnosis of sleep apnea. he does not have a history of rheumatic fever.     he has a BMI of Body mass index is 29.99 kg/m.Marland Kitchen    Filed Weights    03/26/22 1136  Weight: 94.8 kg           Family History  Problem Relation Age of Onset   Coronary artery disease Father     Coronary artery disease Paternal Grandfather     Prostate cancer Brother     Colon cancer Neg Hx     Colon polyps Neg Hx          Atrial Fibrillation Management history:   Previous antiarrhythmic drugs: none Previous cardioversions: 2023 at the [place] Previous ablations: none CHADS2VASC score: 5 Anticoagulation history: Eliquis         Past Medical History:  Diagnosis Date    Barrett's esophagus 2008   CAD (coronary artery disease) 01/09/15    a. cath 01/09/15 PCI with 2 DES stents in LAD/1 in Dx with comcomitant CAD in distal LAD, RI.    Complication of anesthesia      SLOW TO WAKE    Diabetes mellitus without complication ([place])     Hypercholesterolemia     Hypertension     STEMI (ST elevation myocardial infarction) ([place]) 01/09/15         Past Surgical History:  Procedure Laterality Date   BIOPSY   03/18/2018    Procedure: BIOPSY;  Surgeon: Daneil Dolin, MD;  [facility];  Service: Endoscopy;;  esophageal biopsies   CARDIAC CATHETERIZATION N/A 01/09/2015    Procedure: Left Heart Cath and Coronary Angiography;  Surgeon: Leonie Man, MD;  Location: [facility];  Service: Cardiovascular;  Laterality: N/A;   CARDIAC CATHETERIZATION   01/09/2015    with 3 stents   COLONOSCOPY   2008    Dr. Rowe Pavy: normal   COLONOSCOPY N/A 03/18/2018    diverticulosis in sigmoid and descending   CORONARY STENT INTERVENTION N/A 09/01/2018    Procedure: CORONARY STENT INTERVENTION;  Surgeon: Leonie Man, MD;  Location: [facility];  Service: Cardiovascular;  Laterality: N/A;  Lad, RCA   ESOPHAGOGASTRODUODENOSCOPY   2008    Dr. Rowe Pavy: Barrett's. Path with a minute fragment of squamous  cell epithelium suggestive of dysplasia   ESOPHAGOGASTRODUODENOSCOPY N/A 03/18/2018    DR. Rourk: Barrett's esophagus, small hiatal hernia, minimal polypoid gastric mucosa,normal duodenum, surveillance in 3 years   LEFT HEART CATH AND CORONARY ANGIOGRAPHY N/A 09/01/2018    Procedure: LEFT HEART CATH AND CORONARY ANGIOGRAPHY;  Surgeon: Marykay Lex, MD;  Location: [facility] INVASIVE CV LAB;  Service: Cardiovascular;  Laterality: N/A;   left ORIF ankle                Current Outpatient Medications  Medication Sig Dispense Refill   acetaminophen (TYLENOL) 325 MG tablet Take 2 tablets (650 mg total) by mouth every 6 (six) hours as needed for mild pain (or Fever >/= 101).        amLODipine (NORVASC) 10 MG tablet Take 5 mg by mouth daily.        apixaban (ELIQUIS) 5 MG TABS tablet Take 5 mg by mouth 2 (two) times daily.       aspirin EC 81 MG tablet Take 81 mg by mouth daily.       cholecalciferol (VITAMIN D) 1000 UNITS tablet Take 4,000 Units by mouth daily.        insulin aspart (NOVOLOG FLEXPEN) 100 UNIT/ML FlexPen Inject 20 Units into the skin 2 (two) times daily at 8 am and 10 pm. Per sliding scale       insulin glargine (LANTUS) 100 UNIT/ML injection Inject 30 Units into the skin daily.       isosorbide mononitrate (IMDUR) 120 MG 24 hr tablet Take 120 mg by mouth daily.       ketotifen (ZADITOR) 0.025 % ophthalmic solution Place 1 drop into both eyes 2 (two) times daily as needed (for dry eyes).       lisinopril (ZESTRIL) 20 MG tablet Take 1 tablet (20 mg total) by mouth daily. Discontinue lisinopril/hctz 30 tablet 3   loratadine (CLARITIN) 10 MG tablet Take 10 mg by mouth daily as needed for allergies.       Magnesium 400 MG TABS Take by mouth. 1 per day       metoprolol tartrate (LOPRESSOR) 25 MG tablet Take 12.5 mg by mouth 2 (two) times daily.       nitroGLYCERIN (NITROSTAT) 0.4 MG SL tablet Place 1 tablet (0.4 mg total) under the tongue every 5 (five) minutes x 3 doses as needed for chest pain. 25 tablet 12   omeprazole (PRILOSEC) 20 MG capsule Take 20 mg by mouth daily.       potassium chloride (K-DUR,KLOR-CON) 10 MEQ tablet Take 10 mEq by mouth daily as needed (for cramping).       rosuvastatin (CRESTOR) 5 MG tablet Take 5 mg by mouth at bedtime.        vitamin B-12 (CYANOCOBALAMIN) 1000 MCG tablet Take 2,000 mcg by mouth daily.         No current facility-administered medications for this encounter.           Allergies  Allergen Reactions   Lipitor [Atorvastatin] Diarrhea   Plavix [Clopidogrel Bisulfate]        Itching    Pravastatin Diarrhea      Social History         Socioeconomic History   Marital status: Married      Spouse name: Not on  file   Number of children: Not on file   Years of education: Not on file   Highest education level: Not on file  Occupational History   Occupation: Retired,  but works 2 days/week gas station  Tobacco Use   Smoking status: Former      Types: Cigars      Quit date: 07/30/1999      Years since quitting: 22.6   Smokeless tobacco: Never   Tobacco comments:      Former smoker 03/26/22  Vaping Use   Vaping Use: Never used  Substance and Sexual Activity   Alcohol use: Yes      Alcohol/week: 2.0 standard drinks of alcohol      Types: 2 Cans of beer per week      Comment: 1 beer every 2 weeks or week 03/26/22   Drug use: No   Sexual activity: Not on file  Other Topics Concern   Not on file  Social History Narrative    Lives in [place], [place] with wife.    Social Determinants of Health    Financial Resource Strain: Not on file  Food Insecurity: Not on file  Transportation Needs: Not on file  Physical Activity: Not on file  Stress: Not on file  Social Connections: Not on file  Intimate Partner Violence: Not on file        ROS- All systems are reviewed and negative except as per the HPI above.   Physical Exam:    Vitals:    03/26/22 1136  BP: (!) 126/92  Pulse: 73  Weight: 94.8 kg  Height: 5\' 10"  (1.778 m)      GEN- The patient is a well appearing elderly male, alert and oriented x 3 today.   Head- normocephalic, atraumatic Eyes-  Sclera clear, conjunctiva pink Ears- hearing intact Oropharynx- clear Neck- supple  Lungs- Clear to ausculation bilaterally, normal work of breathing Heart- irregular rate and rhythm, no murmurs, rubs or gallops  GI- soft, NT, ND, + BS Extremities- no clubbing, cyanosis, or edema MS- no significant deformity or atrophy Skin- no rash or lesion Psych- euthymic mood, full affect Neuro- strength and sensation are intact      Wt Readings from Last 3 Encounters:  03/26/22 94.8 kg  02/20/22 93.2 kg  11/02/18 86.6 kg      EKG today  demonstrates  Afib Vent. rate 73 BPM PR interval * ms QRS duration 88 ms QT/QTcB 374/412 ms   Echo 08/25/18 demonstrated  Left ventricle: Mild basal septal hypertrophy. Systolic function    was normal. The estimated ejection fraction was in the range of    55% to 60%. Doppler parameters are consistent with abnormal left    ventricular relaxation (grade 1 diastolic dysfunction).  - Regional wall motion abnormality: Mild hypokinesis of the basal    inferior myocardium.   Impressions:   - LVEF has normalized since prior study in 2016.    Epic records are reviewed at length today   CHA2DS2-VASc Score = 5  The patient's score is based upon: CHF History: 0 HTN History: 1 Diabetes History: 1 Stroke History: 0 Vascular Disease History: 1 Age Score: 2 Gender Score: 0         ASSESSMENT AND PLAN: 1. Persistent Atrial Fibrillation (ICD10:  I48.19) The patient's CHA2DS2-VASc score is 5, indicating a 7.2% annual risk of stroke.   Patient presents for dofetilide admission. Continue Eliquis 5 mg BID, states no missed doses in the last 3 weeks. No recent benadryl use PharmD has screened medications. HCTZ stopped, on lisinopril alone.  QTc in SR 391 ms Labs today show creatinine at 0.98, K+ 3.9 and mag 1.5, CrCl calculated at 86  mL/min. Magnesium will need to be repleated before initiating dofetilide.  Continue Lopressor 12.5 mg BID   2. Secondary Hypercoagulable State (ICD10:  D68.69) The patient is at significant risk for stroke/thromboembolism based upon his CHA2DS2-VASc Score of 5.  Continue Apixaban (Eliquis).    3. Obstructive sleep apnea The importance of adequate treatment of sleep apnea was discussed today in order to improve our ability to maintain sinus rhythm long term.   4. CAD S/p multiple DES No anginal symptoms.   5. HTN Stable, no changes today.     To be admitted later today once a bed becomes available.      [facility] [address] [place], [place] [address] [phone] 03/26/2022 11:58 AM  EP Attending  Patient seen and examined. The patient presents for initiation of dofetilide. He has not missed his systemic AC and his QT is acceptable. We will start dofetilide. DCCV if needed.  Carleene Overlie Draken Farrior,MD

## 2022-04-24 ENCOUNTER — Encounter: Payer: Non-veteran care | Admitting: Neurology

## 2022-04-29 ENCOUNTER — Ambulatory Visit: Payer: Non-veteran care | Admitting: Physician Assistant

## 2022-05-02 ENCOUNTER — Encounter (HOSPITAL_COMMUNITY): Payer: Self-pay | Admitting: Physician Assistant

## 2022-05-02 ENCOUNTER — Ambulatory Visit (HOSPITAL_COMMUNITY)
Admission: RE | Admit: 2022-05-02 | Discharge: 2022-05-02 | Disposition: A | Discharge status: Home / Self Care | Payer: No Typology Code available for payment source | Source: Ambulatory Visit | Attending: Physician Assistant | Admitting: Physician Assistant

## 2022-05-02 VITALS — BP 160/100 | HR 50 | Wt 211.2 lb

## 2022-05-02 DIAGNOSIS — I4819 Other persistent atrial fibrillation: Secondary | ICD-10-CM | POA: Diagnosis present

## 2022-05-02 DIAGNOSIS — G4733 Obstructive sleep apnea (adult) (pediatric): Secondary | ICD-10-CM | POA: Diagnosis not present

## 2022-05-02 DIAGNOSIS — Z7901 Long term (current) use of anticoagulants: Secondary | ICD-10-CM | POA: Diagnosis not present

## 2022-05-02 DIAGNOSIS — D6869 Other thrombophilia: Secondary | ICD-10-CM | POA: Insufficient documentation

## 2022-05-02 DIAGNOSIS — I2510 Atherosclerotic heart disease of native coronary artery without angina pectoris: Secondary | ICD-10-CM | POA: Insufficient documentation

## 2022-05-02 DIAGNOSIS — E785 Hyperlipidemia, unspecified: Secondary | ICD-10-CM | POA: Diagnosis not present

## 2022-05-02 DIAGNOSIS — I10 Essential (primary) hypertension: Secondary | ICD-10-CM | POA: Diagnosis not present

## 2022-05-02 LAB — CBC
HCT: 38 % — ABNORMAL LOW (ref 39.0–52.0)
Hemoglobin: 13 g/dL (ref 13.0–17.0)
MCH: 31 pg (ref 26.0–34.0)
MCHC: 34.2 g/dL (ref 30.0–36.0)
MCV: 90.5 fL (ref 80.0–100.0)
Platelets: 215 10*3/uL (ref 150–400)
RBC: 4.2 MIL/uL — ABNORMAL LOW (ref 4.22–5.81)
RDW: 12.9 % (ref 11.5–15.5)
WBC: 5.9 10*3/uL (ref 4.0–10.5)
nRBC: 0 % (ref 0.0–0.2)

## 2022-05-02 LAB — BASIC METABOLIC PANEL
Anion gap: 3 — ABNORMAL LOW (ref 5–15)
BUN: 14 mg/dL (ref 8–23)
CO2: 29 mmol/L (ref 22–32)
Calcium: 9.2 mg/dL (ref 8.9–10.3)
Chloride: 109 mmol/L (ref 98–111)
Creatinine, Ser: 0.92 mg/dL (ref 0.61–1.24)
GFR, Estimated: >60 mL/min (ref >60)
Glucose, Bld: 79 mg/dL (ref 70–99)
Potassium: 4.5 mmol/L (ref 3.5–5.1)
Sodium: 141 mmol/L (ref 135–145)

## 2022-05-02 LAB — MAGNESIUM: Magnesium: 1.9 mg/dL (ref 1.7–2.4)

## 2022-05-02 NOTE — Progress Notes (Signed)
Primary Care Physician: [facility] Primary Cardiologist: Dr Roger Shelter [place] Primary Electrophysiologist: Dr Caryl Comes Referring Physician: Dr Marella Chimes is a 76 y.o. male with a history of CAD, HLD, HTN, OSA, DM, Barrett's esophagus, atrial fibrillation who presents for follow up in the [facility].  The patient was initially diagnosed with atrial fibrillation at a routine cardiology visit at the [place]. He was started on Eliquis for a CHADS2VASC score of 5 and underwent DCCV. Unfortunately, he reverted back to afib after a couple of weeks. He was seen by Dr Caryl Comes 02/20/22 who recommended dofetilide.  Patient is s/p dofetilide loading 8/29-03/29/22. He converted with the medication and did not require DCCV.   On follow up today, patient reports that he has done well since his last visit. He is back in SR. He reports good compliance with medication. He does not check his BP consistently at home. He did have a high sodium meal last evening. He denies headache, vision changes, or chest pain.  Today, he denies symptoms of palpitations, chest pain, shortness of breath, orthopnea, PND, lower extremity edema, dizziness, presyncope, syncope, bleeding, or neurologic sequela. The patient is tolerating medications without difficulties and is otherwise without complaint today.    Atrial Fibrillation Risk Factors:  he does have symptoms or diagnosis of sleep apnea. he does not have a history of rheumatic fever.   he has a BMI of Body mass index is 30.3 kg/m.Marland Kitchen Filed Weights   05/02/22 1027  Weight: 95.8 kg    Family History  Problem Relation Age of Onset   Coronary artery disease Father    Coronary artery disease Paternal Grandfather    Prostate cancer Brother    Colon cancer Neg Hx    Colon polyps Neg Hx      Atrial Fibrillation Management history:  Previous antiarrhythmic drugs: dofetilide  Previous cardioversions: 2023 at the  [place] Previous ablations: none CHADS2VASC score: 5 Anticoagulation history: Eliquis   Past Medical History:  Diagnosis Date   Barrett's esophagus 2008   CAD (coronary artery disease) 01/09/15   a. cath 01/09/15 PCI with 2 DES stents in LAD/1 in Dx with comcomitant CAD in distal LAD, RI.    Complication of anesthesia    SLOW TO WAKE    Diabetes mellitus without complication ([place])    Hypercholesterolemia    Hypertension    STEMI (ST elevation myocardial infarction) ([place]) 01/09/15   Past Surgical History:  Procedure Laterality Date   BIOPSY  03/18/2018   Procedure: BIOPSY;  Surgeon: Daneil Dolin, MD;  [facility];  Service: Endoscopy;;  esophageal biopsies   CARDIAC CATHETERIZATION N/A 01/09/2015   Procedure: Left Heart Cath and Coronary Angiography;  Surgeon: Leonie Man, MD;  Location: [place] CV LAB;  Service: Cardiovascular;  Laterality: N/A;   CARDIAC CATHETERIZATION  01/09/2015   with 3 stents   COLONOSCOPY  2008   Dr. Rowe Pavy: normal   COLONOSCOPY N/A 03/18/2018   diverticulosis in sigmoid and descending   CORONARY STENT INTERVENTION N/A 09/01/2018   Procedure: CORONARY STENT INTERVENTION;  Surgeon: Leonie Man, MD;  Location: [place] CV LAB;  Service: Cardiovascular;  Laterality: N/A;  Lad, RCA   ESOPHAGOGASTRODUODENOSCOPY  2008   Dr. Rowe Pavy: Barrett's. Path with a minute fragment of squamous cell epithelium suggestive of dysplasia   ESOPHAGOGASTRODUODENOSCOPY N/A 03/18/2018   DR. Rourk: Barrett's esophagus, small hiatal hernia, minimal polypoid gastric mucosa,normal duodenum, surveillance in 3  years   LEFT HEART CATH AND CORONARY ANGIOGRAPHY N/A 09/01/2018   Procedure: LEFT HEART CATH AND CORONARY ANGIOGRAPHY;  Surgeon: Leonie Man, MD;  Location: [place] [facility] LAB;  Service: Cardiovascular;  Laterality: N/A;   left ORIF ankle      Current Outpatient Medications  Medication Sig Dispense Refill   acetaminophen (TYLENOL) 325 MG tablet Take 2 tablets  (650 mg total) by mouth every 6 (six) hours as needed for mild pain (or Fever >/= 101).     amLODipine (NORVASC) 10 MG tablet Take 5 mg by mouth daily.      apixaban (ELIQUIS) 5 MG TABS tablet Take 5 mg by mouth 2 (two) times daily.     aspirin EC 81 MG tablet Take 81 mg by mouth every evening.     Cholecalciferol (VITAMIN D) 50 MCG (2000 UT) tablet Take 4,000 Units by mouth daily.     dofetilide (TIKOSYN) 500 MCG capsule Take 1 capsule (500 mcg total) by mouth 2 (two) times daily. 60 capsule 4   Glycerin (CLEAR EYES ADV DRY & ITCHY RLF) 0.25 % SOLN Apply 1 drop to eye 3 (three) times daily as needed (dry eyes).     insulin aspart (NOVOLOG FLEXPEN) 100 UNIT/ML FlexPen Inject 20 Units into the skin 2 (two) times daily at 8 am and 10 pm.     insulin glargine (LANTUS) 100 UNIT/ML injection Inject 30 Units into the skin every evening.     isosorbide mononitrate (IMDUR) 60 MG 24 hr tablet Take 60 mg by mouth daily.     lisinopril (ZESTRIL) 20 MG tablet Take 1 tablet (20 mg total) by mouth daily. Discontinue lisinopril/hctz 30 tablet 3   loratadine (CLARITIN) 10 MG tablet Take 10 mg by mouth daily as needed for allergies.     Magnesium 400 MG TABS Take 1 tablet by mouth every evening. 1 per day     metFORMIN (GLUCOPHAGE) 500 MG tablet Take 500 mg by mouth 2 (two) times daily with a meal.     metoprolol tartrate (LOPRESSOR) 50 MG tablet Take 25 mg by mouth 2 (two) times daily.     nitroGLYCERIN (NITROSTAT) 0.4 MG SL tablet Place 1 tablet (0.4 mg total) under the tongue every 5 (five) minutes x 3 doses as needed for chest pain. 25 tablet 12   omeprazole (PRILOSEC) 20 MG capsule Take 20 mg by mouth daily.     potassium chloride (K-DUR,KLOR-CON) 10 MEQ tablet Take 10 mEq by mouth daily.     rosuvastatin (CRESTOR) 10 MG tablet Take 5 mg by mouth every evening.     No current facility-administered medications for this encounter.    Allergies  Allergen Reactions   Lipitor [Atorvastatin] Diarrhea    Plavix [Clopidogrel Bisulfate]     Itching    Pravastatin Diarrhea    Social History   Socioeconomic History   Marital status: Married    Spouse name: Not on file   Number of children: Not on file   Years of education: Not on file   Highest education level: Not on file  Occupational History   Occupation: Retired, but works 2 days/week gas station  Tobacco Use   Smoking status: Former    Types: Cigars    Quit date: 07/30/1999    Years since quitting: 22.7   Smokeless tobacco: Never   Tobacco comments:    Former smoker 03/26/22  Vaping Use   Vaping Use: Never used  Substance and Sexual Activity   Alcohol  use: Yes    Alcohol/week: 2.0 standard drinks of alcohol    Types: 2 Cans of beer per week    Comment: 1 beer every 2 weeks or week 03/26/22   Drug use: No   Sexual activity: Not on file  Other Topics Concern   Not on file  Social History Narrative   Lives in [place], [place] with wife.   Social Determinants of Health   Financial Resource Strain: Not on file  Food Insecurity: Not on file  Transportation Needs: Not on file  Physical Activity: Not on file  Stress: Not on file  Social Connections: Not on file  Intimate Partner Violence: Not on file     ROS- All systems are reviewed and negative except as per the HPI above.  Physical Exam: Vitals:   05/02/22 1027 05/02/22 1105  BP: (!) 180/110 (!) 160/100  Pulse: (!) 50   Weight: 95.8 kg      GEN- The patient is a well appearing elderly male, alert and oriented x 3 today.   HEENT-head normocephalic, atraumatic, sclera clear, conjunctiva pink, hearing intact, trachea midline. Lungs- Clear to ausculation bilaterally, normal work of breathing Heart- Regular rate and rhythm, bradycardia, no murmurs, rubs or gallops  GI- soft, NT, ND, + BS Extremities- no clubbing, cyanosis, or edema MS- no significant deformity or atrophy Skin- no rash or lesion Psych- euthymic mood, full affect Neuro- strength and sensation are  intact   Wt Readings from Last 3 Encounters:  05/02/22 95.8 kg  04/03/22 93.4 kg  03/26/22 94.8 kg    EKG today demonstrates  SB Vent. rate 50 BPM PR interval 176 ms QRS duration 92 ms QT/QTcB 468/426 ms  Echo 08/25/18 demonstrated  Left ventricle: Mild basal septal hypertrophy. Systolic function    was normal. The estimated ejection fraction was in the range of    55% to 60%. Doppler parameters are consistent with abnormal left    ventricular relaxation (grade 1 diastolic dysfunction).  - Regional wall motion abnormality: Mild hypokinesis of the basal    inferior myocardium.   Impressions:   - LVEF has normalized since prior study in 2016.   Epic records are reviewed at length today  CHA2DS2-VASc Score = 5  The patient's score is based upon: CHF History: 0 HTN History: 1 Diabetes History: 1 Stroke History: 0 Vascular Disease History: 1 Age Score: 2 Gender Score: 0       ASSESSMENT AND PLAN: 1. Persistent Atrial Fibrillation (ICD10:  I48.19) The patient's CHA2DS2-VASc score is 5, indicating a 7.2% annual risk of stroke.   S/p dofetilide admission 8/29-03/29/22. Patient in [place] today.  Continue dofetilide 500 mcg BID. QT stable.  Check bmet/mag Continue Eliquis 5 mg BID Continue Lopressor 25 mg BID  2. Secondary Hypercoagulable State (ICD10:  D68.69) The patient is at significant risk for stroke/thromboembolism based upon his CHA2DS2-VASc Score of 5.  Continue Apixaban (Eliquis).   3. Obstructive sleep apnea Patient waiting on CPAP to arrive.  Getting it through the [place].  4. CAD S/p multiple DES No anginal symptoms.  5. HTN Elevated today. Patient did have high salt dinner and breakfast. BP improved on recheck. He has a visit with his primary cardiologist later today. Would recommend increasing lisinopril or amlodipine.     Follow up with his cardiologist at the [facility]. Patient states they plan to follow his dofetilide. AF clinic as needed.    [facility] [place],  [place] [address] [phone] 05/02/2022 11:15 AM

## 2022-09-05 ENCOUNTER — Encounter (HOSPITAL_COMMUNITY): Payer: Self-pay | Admitting: *Deleted

## 2024-08-25 ENCOUNTER — Emergency Department (HOSPITAL_COMMUNITY)

## 2024-08-25 ENCOUNTER — Other Ambulatory Visit: Payer: Self-pay

## 2024-08-25 ENCOUNTER — Emergency Department (HOSPITAL_COMMUNITY)
Admission: EM | Admit: 2024-08-25 | Discharge: 2024-08-25 | Disposition: A | Discharge status: Home / Self Care | Attending: Emergency Medicine | Admitting: Emergency Medicine

## 2024-08-25 ENCOUNTER — Encounter (HOSPITAL_COMMUNITY): Payer: Self-pay

## 2024-08-25 DIAGNOSIS — W19XXXA Unspecified fall, initial encounter: Secondary | ICD-10-CM

## 2024-08-25 DIAGNOSIS — Z7982 Long term (current) use of aspirin: Secondary | ICD-10-CM | POA: Insufficient documentation

## 2024-08-25 DIAGNOSIS — S0990XA Unspecified injury of head, initial encounter: Secondary | ICD-10-CM | POA: Insufficient documentation

## 2024-08-25 DIAGNOSIS — I482 Chronic atrial fibrillation, unspecified: Secondary | ICD-10-CM | POA: Insufficient documentation

## 2024-08-25 DIAGNOSIS — W000XXA Fall on same level due to ice and snow, initial encounter: Secondary | ICD-10-CM | POA: Insufficient documentation

## 2024-08-25 DIAGNOSIS — Z7901 Long term (current) use of anticoagulants: Secondary | ICD-10-CM | POA: Insufficient documentation

## 2024-08-25 NOTE — Discharge Instructions (Signed)
 You are seen in the emergency department for evaluation of head injury after a fall.  You had a CAT scan of your head and neck.  There was no signs of any active bleeding or fracture.  You do have significant degenerative changes in your cervical spine.  You can continue to take your regular medications and follow-up with your primary care team.  Return if any worsening or concerning symptoms.

## 2024-08-25 NOTE — ED Provider Notes (Signed)
 " [facility] AT [facility] Provider Note   CSN: [id number] Arrival date & time: 08/25/24  1948     Patient presents with: No chief complaint on file.   Gary Wheeler is a 79 y.o. male.  He is presenting after head injury fall.  He said he was getting out of his truck slipped on the ice fell back and hit his head.  Complaining of pain in the back of the head along with some minor pain in his back and buttocks.  No loss of consciousness.  He is on Eliquis  for A-fib.  No other complaints.  {Add pertinent medical, surgical, social history, OB history to YEP:67052} The history is provided by the patient and the spouse.  Fall This is a new problem. The problem has not changed since onset.Associated symptoms include headaches. Pertinent negatives include no chest pain, no abdominal pain and no shortness of breath. Nothing aggravates the symptoms. Nothing relieves the symptoms. He has tried nothing for the symptoms. The treatment provided no relief.       Prior to Admission medications  Medication Sig Start Date End Date Taking? Authorizing Provider  acetaminophen  (TYLENOL ) 325 MG tablet Take 2 tablets (650 mg total) by mouth every 6 (six) hours as needed for mild pain (or Fever >/= 101). 09/02/18   Rizwan, Saima, MD  amLODipine  (NORVASC ) 10 MG tablet Take 5 mg by mouth daily.     [provider]  apixaban  (ELIQUIS ) 5 MG TABS tablet Take 5 mg by mouth 2 (two) times daily.    [provider]  aspirin  EC 81 MG tablet Take 81 mg by mouth every evening.    [provider]  Cholecalciferol  (VITAMIN D ) 50 MCG (2000 UT) tablet Take 4,000 Units by mouth daily.    [provider]  dofetilide  (TIKOSYN ) 500 MCG capsule Take 1 capsule (500 mcg total) by mouth 2 (two) times daily. 03/29/22   Ursuy, Renee Lynn, PA-C  Glycerin (CLEAR EYES ADV DRY & ITCHY RLF) 0.25 % SOLN Apply 1 drop to eye 3 (three) times daily as needed (dry eyes).    [provider]  insulin  aspart (NOVOLOG  FLEXPEN) 100 UNIT/ML FlexPen Inject 20 Units into the skin 2 (two) times daily at 8 am and 10 pm.    [provider]  insulin  glargine (LANTUS ) 100 UNIT/ML injection Inject 30 Units into the skin every evening.    [provider]  isosorbide  mononitrate (IMDUR ) 60 MG 24 hr tablet Take 60 mg by mouth daily.    [provider]  lisinopril  (ZESTRIL ) 20 MG tablet Take 1 tablet (20 mg total) by mouth daily. Discontinue lisinopril benson 03/01/22   Fernande Elspeth BROCKS, MD  loratadine  (CLARITIN ) 10 MG tablet Take 10 mg by mouth daily as needed for allergies.    [provider]  Magnesium  400 MG TABS Take 1 tablet by mouth every evening. 1 per day    [provider]  metFORMIN  (GLUCOPHAGE ) 500 MG tablet Take 500 mg by mouth 2 (two) times daily with a meal.    [provider]  metoprolol  tartrate (LOPRESSOR ) 50 MG tablet Take 25 mg by mouth 2 (two) times daily.    [provider]  nitroGLYCERIN  (NITROSTAT ) 0.4 MG SL tablet Place 1 tablet (0.4 mg total) under the tongue every 5 (five) minutes x 3 doses as needed for chest pain. 01/11/15   Bhagat, Aleene, PA  omeprazole (PRILOSEC) 20 MG capsule Take 20 mg by  mouth daily.    [provider]  potassium chloride  (K-DUR,KLOR-CON ) 10 MEQ tablet Take 10 mEq by mouth daily.    [provider]  rosuvastatin  (CRESTOR ) 10 MG tablet Take 5 mg by mouth every evening.    [provider]    Allergies: Lipitor  [atorvastatin ], Plavix  [clopidogrel  bisulfate], and Pravastatin     Review of Systems  Respiratory:  Negative for shortness of breath.   Cardiovascular:  Negative for chest pain.  Gastrointestinal:  Negative for abdominal pain.  Neurological:  Positive for headaches.    Updated Vital Signs BP (!) 204/112   Pulse 67   Temp 98.5 F (36.9 C) (Oral)   Resp 18   SpO2 97%   Physical Exam Vitals and nursing note reviewed.   Constitutional:      General: He is not in acute distress.    Appearance: Normal appearance. He is well-developed.  HENT:     Head: Normocephalic and atraumatic.  Eyes:     Conjunctiva/sclera: Conjunctivae normal.  Cardiovascular:     Rate and Rhythm: Normal rate and regular rhythm.     Heart sounds: No murmur heard. Pulmonary:     Effort: Pulmonary effort is normal. No respiratory distress.     Breath sounds: Normal breath sounds.  Abdominal:     Palpations: Abdomen is soft.     Tenderness: There is no abdominal tenderness.  Musculoskeletal:        General: No deformity.     Cervical back: Neck supple.  Skin:    General: Skin is warm and dry.     Capillary Refill: Capillary refill takes less than 2 seconds.  Neurological:     General: No focal deficit present.     Mental Status: He is alert.     Motor: No weakness.     (all labs ordered are listed, but only abnormal results are displayed) Labs Reviewed - No data to display  EKG: None  Radiology: No results found.  {Document cardiac monitor, telemetry assessment procedure when appropriate:32947} Procedures   Medications Ordered in the ED - No data to display    {Click here for ABCD2, HEART and other calculators REFRESH Note before signing:1}                              Medical Decision Making Amount and/or Complexity of Data Reviewed Radiology: ordered.   This patient complains of ***; this involves an extensive number of treatment Options and is a complaint that carries with it a high risk of complications and morbidity. The differential includes ***  I ordered, reviewed and interpreted labs, which included *** I ordered medication *** and reviewed PMP when indicated. I ordered imaging studies which included *** and I independently    visualized and interpreted imaging which showed *** Additional history obtained from *** Previous records obtained and reviewed *** I consulted *** and discussed lab and  imaging findings and discussed disposition.  Cardiac monitoring reviewed, *** Social determinants considered, *** Critical Interventions: ***  After the interventions stated above, I reevaluated the patient and found *** Admission and further testing considered, ***   {Document critical care time when appropriate  Document review of labs and clinical decision tools ie CHADS2VASC2, etc  Document your independent review of radiology images and any outside records  Document your discussion with family members, caretakers and with consultants  Document social determinants of health affecting pt's care  Document your decision  making why or why not admission, treatments were needed:32947:::1}   Final diagnoses:  None    ED Discharge Orders     None        "

## 2024-08-25 NOTE — ED Triage Notes (Signed)
 Pt fell on ice tonight causing him to hit is back and head and now is having pain. Pt is on blood thinner.
